# Patient Record
Sex: Female | Born: 1960
Health system: Southern US, Community
[De-identification: ages and names within clinical notes are randomized; demographics above are authoritative.]

## PROBLEM LIST (undated history)

## (undated) DIAGNOSIS — E039 Hypothyroidism, unspecified: Secondary | ICD-10-CM

## (undated) DIAGNOSIS — E079 Disorder of thyroid, unspecified: Secondary | ICD-10-CM

## (undated) DIAGNOSIS — M1990 Unspecified osteoarthritis, unspecified site: Secondary | ICD-10-CM

## (undated) DIAGNOSIS — Z85850 Personal history of malignant neoplasm of thyroid: Secondary | ICD-10-CM

## (undated) DIAGNOSIS — L408 Other psoriasis: Secondary | ICD-10-CM

## (undated) DIAGNOSIS — R7303 Prediabetes: Secondary | ICD-10-CM

## (undated) DIAGNOSIS — N83209 Unspecified ovarian cyst, unspecified side: Secondary | ICD-10-CM

## (undated) DIAGNOSIS — I639 Cerebral infarction, unspecified: Secondary | ICD-10-CM

## (undated) DIAGNOSIS — R011 Cardiac murmur, unspecified: Secondary | ICD-10-CM

## (undated) DIAGNOSIS — L732 Hidradenitis suppurativa: Secondary | ICD-10-CM

## (undated) DIAGNOSIS — N950 Postmenopausal bleeding: Secondary | ICD-10-CM

## (undated) DIAGNOSIS — Z7901 Long term (current) use of anticoagulants: Secondary | ICD-10-CM

## (undated) DIAGNOSIS — C801 Malignant (primary) neoplasm, unspecified: Secondary | ICD-10-CM

## (undated) DIAGNOSIS — I6523 Occlusion and stenosis of bilateral carotid arteries: Secondary | ICD-10-CM

## (undated) DIAGNOSIS — D6869 Other thrombophilia: Secondary | ICD-10-CM

## (undated) DIAGNOSIS — E782 Mixed hyperlipidemia: Secondary | ICD-10-CM

## (undated) DIAGNOSIS — D649 Anemia, unspecified: Secondary | ICD-10-CM

## (undated) DIAGNOSIS — M170 Bilateral primary osteoarthritis of knee: Secondary | ICD-10-CM

## (undated) HISTORY — PX: REPLACEMENT TOTAL KNEE: SUR1224

## (undated) HISTORY — PX: TUBAL LIGATION: SHX77

---

## 2006-12-03 ENCOUNTER — Ambulatory Visit: Payer: Self-pay | Admitting: Family Medicine

## 2011-09-22 HISTORY — PX: ENDOMETRIAL BIOPSY: SHX622

## 2012-05-24 ENCOUNTER — Ambulatory Visit: Payer: Self-pay | Admitting: Internal Medicine

## 2012-06-01 ENCOUNTER — Ambulatory Visit: Payer: Self-pay | Admitting: Internal Medicine

## 2012-06-01 LAB — HM MAMMOGRAPHY: HM MAMMO: NORMAL

## 2012-07-08 LAB — LIPID PANEL
Cholesterol: 179 mg/dL (ref 0–200)
HDL: 67 mg/dL (ref 35–70)
LDL Cholesterol: 100 mg/dL
TRIGLYCERIDES: 59 mg/dL (ref 40–160)

## 2012-07-08 LAB — TSH: TSH: 2.9 u[IU]/mL (ref 0.41–5.90)

## 2012-07-08 LAB — CBC AND DIFFERENTIAL: Hemoglobin: 12.7 g/dL (ref 12.0–16.0)

## 2012-07-08 LAB — HM PAP SMEAR: HM PAP: NEGATIVE

## 2012-07-08 LAB — BASIC METABOLIC PANEL
BUN: 17 mg/dL (ref 4–21)
CREATININE: 0.6 mg/dL (ref 0.5–1.1)

## 2012-07-11 ENCOUNTER — Ambulatory Visit: Payer: Self-pay | Admitting: Internal Medicine

## 2015-09-02 ENCOUNTER — Encounter: Payer: Self-pay | Admitting: Internal Medicine

## 2015-09-02 DIAGNOSIS — M1711 Unilateral primary osteoarthritis, right knee: Secondary | ICD-10-CM

## 2015-09-02 DIAGNOSIS — L732 Hidradenitis suppurativa: Secondary | ICD-10-CM | POA: Insufficient documentation

## 2015-09-02 DIAGNOSIS — M1712 Unilateral primary osteoarthritis, left knee: Secondary | ICD-10-CM | POA: Insufficient documentation

## 2015-09-03 ENCOUNTER — Encounter: Payer: Self-pay | Admitting: Internal Medicine

## 2015-09-03 ENCOUNTER — Ambulatory Visit: Payer: Self-pay | Admitting: Internal Medicine

## 2015-09-03 ENCOUNTER — Ambulatory Visit (INDEPENDENT_AMBULATORY_CARE_PROVIDER_SITE_OTHER): Payer: BLUE CROSS/BLUE SHIELD | Admitting: Internal Medicine

## 2015-09-03 VITALS — BP 140/82 | HR 64 | Ht 66.0 in | Wt 243.8 lb

## 2015-09-03 DIAGNOSIS — E041 Nontoxic single thyroid nodule: Secondary | ICD-10-CM | POA: Diagnosis not present

## 2015-09-03 DIAGNOSIS — L732 Hidradenitis suppurativa: Secondary | ICD-10-CM | POA: Diagnosis not present

## 2015-09-03 NOTE — Progress Notes (Signed)
Date:  09/03/2015   Name:  Leah Rogers   DOB:  1961/07/07   MRN:  [id number]   Patient returns to re-establish care.  Last seen in October 2013.  Chief Complaint: Neck Pain Neck Pain  This is a new problem. The current episode started more than 1 month ago. The problem occurs every several days. The problem has been waxing and waning. The pain is associated with nothing. The pain is present in the left side. Pertinent negatives include no chest pain, fever, headaches or trouble swallowing.  Rash This is a recurrent problem. The problem has been waxing and waning since onset. The rash is characterized by draining and swelling. She was exposed to nothing. Pertinent negatives include no diarrhea, fatigue, fever, shortness of breath or sore throat. Treatments tried: recurrent abscess in axilla.  She had a cold several months ago and noticed the neck swelling and tenderness then.  It is slowly getting smaller but still slightly tender. She denies any trouble swallowing. She has no history of thyroid problems but her mother did have thyroid surgery. She denies sweats, hair loss, diarrhea, palpitations or edema.   Review of Systems  Constitutional: Negative for fever, chills, fatigue and unexpected weight change.  HENT: Negative for sore throat and trouble swallowing.   Respiratory: Negative for chest tightness and shortness of breath.   Cardiovascular: Negative for chest pain, palpitations and leg swelling.  Gastrointestinal: Negative for abdominal pain and diarrhea.  Musculoskeletal: Positive for neck pain.  Skin: Negative for color change and rash.  Neurological: Negative for light-headedness and headaches.    Patient Active Problem List   Diagnosis Date Noted  . Osteoarthritis of knee 09/02/2015  . Hidradenitis axillaris 09/02/2015    Prior to Admission medications   Not on File    No Known Allergies  Past Surgical History  Procedure Laterality Date  . Endometrial biopsy  2013     benign- post menopausal bleeding    Social History  Substance Use Topics  . Smoking status: Never Smoker   . Smokeless tobacco: None  . Alcohol Use: No    Medication list has been reviewed and updated.   Physical Exam  Constitutional: She is oriented to person, place, and time. She appears well-developed and well-nourished.  Neck: Normal range of motion and full passive range of motion without pain. Neck supple. Carotid bruit is not present. Thyroid mass (enlarged left lobe with mild tenderness) present.  Cardiovascular: Normal rate, regular rhythm and normal heart sounds.   Pulmonary/Chest: Effort normal and breath sounds normal. No respiratory distress. She has no wheezes.  Abdominal: Soft. Bowel sounds are normal.  Musculoskeletal: She exhibits no edema or tenderness.  Neurological: She is alert and oriented to person, place, and time. She has normal reflexes.  Skin: Rash noted. Rash is nodular.  Open lesion in right axilla - 4x4 mm with mild surrounding erythema c/w abscess that has drained.  Nursing note and vitals reviewed.   BP 140/82 mmHg  Pulse 64  Ht 5\' 6"  (1.676 m)  Wt 243 lb 12.8 oz (110.587 kg)  BMI 39.37 kg/m2  Assessment and Plan: 1. Thyroid nodule Take Aleve 2 tablets twice a day for pain and inflammation  - Thyroid Panel With TSH - US Soft Tissue Head/Neck; Future  2. Hidradenitis axillaris Continue local care; avoid shaving and use antibacterial bath soap   Halina Maidens, MD [facility]  09/03/2015

## 2015-09-03 NOTE — Patient Instructions (Signed)
Take Aleve 2 tabs twice a day until results from labs and ultrasound return.

## 2015-09-04 LAB — THYROID PANEL WITH TSH
Free Thyroxine Index: 1.7 (ref 1.2–4.9)
T3 UPTAKE RATIO: 24 % (ref 24–39)
T4, Total: 6.9 ug/dL (ref 4.5–12.0)
TSH: 2.04 u[IU]/mL (ref 0.450–4.500)

## 2015-09-09 ENCOUNTER — Ambulatory Visit
Admission: RE | Admit: 2015-09-09 | Discharge: 2015-09-09 | Disposition: A | Discharge status: Home / Self Care | Payer: BLUE CROSS/BLUE SHIELD | Source: Ambulatory Visit | Attending: Internal Medicine | Admitting: Internal Medicine

## 2015-09-09 ENCOUNTER — Ambulatory Visit: Payer: Self-pay

## 2015-09-09 DIAGNOSIS — E042 Nontoxic multinodular goiter: Secondary | ICD-10-CM | POA: Insufficient documentation

## 2015-09-09 DIAGNOSIS — E041 Nontoxic single thyroid nodule: Secondary | ICD-10-CM

## 2015-09-10 ENCOUNTER — Other Ambulatory Visit: Payer: Self-pay | Admitting: Internal Medicine

## 2015-09-10 DIAGNOSIS — E042 Nontoxic multinodular goiter: Secondary | ICD-10-CM | POA: Insufficient documentation

## 2015-09-24 ENCOUNTER — Other Ambulatory Visit: Payer: Self-pay | Admitting: Otolaryngology

## 2015-09-24 DIAGNOSIS — E041 Nontoxic single thyroid nodule: Secondary | ICD-10-CM

## 2015-09-25 ENCOUNTER — Ambulatory Visit
Admission: RE | Admit: 2015-09-25 | Discharge: 2015-09-25 | Disposition: A | Discharge status: Home / Self Care | Payer: BLUE CROSS/BLUE SHIELD | Source: Ambulatory Visit | Attending: Otolaryngology | Admitting: Otolaryngology

## 2015-09-25 DIAGNOSIS — E041 Nontoxic single thyroid nodule: Secondary | ICD-10-CM

## 2015-09-25 NOTE — Procedures (Signed)
Interventional Radiology Procedure Note  Procedure:  Bilateral thyroid nodule biopsy  Complications:  None  Estimated Blood Loss:  < 10 mL  Bilateral dominant thyroid nodule biopsy performed.  25 G FNA x 5 for each nodule w/ samples sent for additional possible Afirma testing.  Venetia Night. Kathlene Cote, M.D Pager:  [phone]

## 2015-09-25 NOTE — Discharge Instructions (Signed)

## 2015-09-26 LAB — CYTOLOGY - NON PAP

## 2015-10-01 ENCOUNTER — Encounter: Payer: Self-pay | Admitting: Internal Medicine

## 2015-10-01 DIAGNOSIS — C73 Malignant neoplasm of thyroid gland: Secondary | ICD-10-CM | POA: Insufficient documentation

## 2015-10-07 ENCOUNTER — Encounter
Admission: RE | Admit: 2015-10-07 | Discharge: 2015-10-07 | Disposition: A | Discharge status: Home / Self Care | Payer: BLUE CROSS/BLUE SHIELD | Source: Ambulatory Visit | Attending: Otolaryngology | Admitting: Otolaryngology

## 2015-10-07 DIAGNOSIS — Z01812 Encounter for preprocedural laboratory examination: Secondary | ICD-10-CM | POA: Insufficient documentation

## 2015-10-07 HISTORY — DX: Unspecified osteoarthritis, unspecified site: M19.90

## 2015-10-07 HISTORY — DX: Cardiac murmur, unspecified: R01.1

## 2015-10-07 HISTORY — DX: Malignant (primary) neoplasm, unspecified: C80.1

## 2015-10-07 LAB — BASIC METABOLIC PANEL
Anion gap: 6 (ref 5–15)
BUN: 21 mg/dL — AB (ref 6–20)
CHLORIDE: 103 mmol/L (ref 101–111)
CO2: 29 mmol/L (ref 22–32)
Calcium: 8.8 mg/dL — ABNORMAL LOW (ref 8.9–10.3)
Creatinine, Ser: 0.69 mg/dL (ref 0.44–1.00)
GFR calc Af Amer: >60 mL/min (ref >60)
GLUCOSE: 101 mg/dL — AB (ref 65–99)
POTASSIUM: 3.6 mmol/L (ref 3.5–5.1)
Sodium: 138 mmol/L (ref 135–145)

## 2015-10-07 LAB — HEMOGLOBIN: Hemoglobin: 12.9 g/dL (ref 12.0–16.0)

## 2015-10-07 LAB — TSH: TSH: 2.67 u[IU]/mL (ref 0.350–4.500)

## 2015-10-07 LAB — T4, FREE: Free T4: 0.82 ng/dL (ref 0.61–1.12)

## 2015-10-07 NOTE — Patient Instructions (Signed)
  Your procedure is scheduled on: October 14, 2015 (Monday) Report to [facility]) Second Floor. To find out your arrival time please call [phone] between 1PM - 3PM on  October 11, 2015 (Friday)Remember: Instructions that are not followed completely may result in serious medical risk, up to and including death, or upon the discretion of your surgeon and anesthesiologist your surgery may need to be rescheduled.    _x___ 1. Do not eat food or drink liquids after midnight. No gum chewing or hard candies.     _x___ 2. No Alcohol for 24 hours before or after surgery.   ____ 3. Bring all medications with you on the day of surgery if instructed.    _x___ 4. Notify your doctor if there is any change in your medical condition     (cold, fever, infections).     Do not wear jewelry, make-up, hairpins, clips or nail polish.  Do not wear lotions, powders, or perfumes. You may wear deodorant.  Do not shave 48 hours prior to surgery. Men may shave face and neck.  Do not bring valuables to the hospital.    [facility] is not responsible for any belongings or valuables.               Contacts, dentures or bridgework may not be worn into surgery.  Leave your suitcase in the car. After surgery it may be brought to your room.  For patients admitted to the hospital, discharge time is determined by your                treatment team.   Patients discharged the day of surgery will not be allowed to drive home.   Please read over the following fact sheets that you were given:   Surgical Site Infection Prevention   ____ Take these medicines the morning of surgery with A SIP OF WATER:    1.   2.   3.   4.  5.  6.  ____ Fleet Enema (as directed)   _x___ Use CHG Soap as directed  ____ Use inhalers on the day of surgery  ____ Stop metformin 2 days prior to surgery    ____ Take 1/2 of usual insulin dose the night before surgery and none on the morning of surgery.   ____ Stop  Coumadin/Plavix/aspirin on   __x__ Stop Anti-inflammatories on (STOP ALEVE NOW ) TYLENOL OK TO TAKE FOR PAIN IF NEEDED)   ____ Stop supplements until after surgery.    ____ Bring C-Pap to the hospital.

## 2015-10-08 LAB — T3, FREE: T3, Free: 3.4 pg/mL (ref 2.0–4.4)

## 2015-10-14 ENCOUNTER — Ambulatory Visit: Payer: BLUE CROSS/BLUE SHIELD | Admitting: Certified Registered"

## 2015-10-14 ENCOUNTER — Encounter
Admission: RE | Disposition: A | Discharge status: Home / Self Care | Payer: Self-pay | Source: Ambulatory Visit | Attending: Otolaryngology

## 2015-10-14 ENCOUNTER — Encounter: Payer: Self-pay | Admitting: *Deleted

## 2015-10-14 ENCOUNTER — Observation Stay
Admission: RE | Admit: 2015-10-14 | Discharge: 2015-10-15 | Disposition: A | Discharge status: Home / Self Care | Payer: BLUE CROSS/BLUE SHIELD | Source: Ambulatory Visit | Attending: Otolaryngology | Admitting: Otolaryngology

## 2015-10-14 DIAGNOSIS — E890 Postprocedural hypothyroidism: Secondary | ICD-10-CM

## 2015-10-14 DIAGNOSIS — M542 Cervicalgia: Secondary | ICD-10-CM | POA: Insufficient documentation

## 2015-10-14 DIAGNOSIS — E042 Nontoxic multinodular goiter: Secondary | ICD-10-CM | POA: Diagnosis present

## 2015-10-14 DIAGNOSIS — E669 Obesity, unspecified: Secondary | ICD-10-CM | POA: Insufficient documentation

## 2015-10-14 DIAGNOSIS — Z6838 Body mass index (BMI) 38.0-38.9, adult: Secondary | ICD-10-CM | POA: Insufficient documentation

## 2015-10-14 DIAGNOSIS — Z87891 Personal history of nicotine dependence: Secondary | ICD-10-CM | POA: Insufficient documentation

## 2015-10-14 DIAGNOSIS — C73 Malignant neoplasm of thyroid gland: Principal | ICD-10-CM | POA: Insufficient documentation

## 2015-10-14 DIAGNOSIS — Z85850 Personal history of malignant neoplasm of thyroid: Secondary | ICD-10-CM

## 2015-10-14 HISTORY — PX: THYROIDECTOMY: SHX17

## 2015-10-14 SURGERY — THYROIDECTOMY
Anesthesia: General | Site: Neck | Wound class: Clean

## 2015-10-14 MED ORDER — LACTATED RINGERS IV SOLN
INTRAVENOUS | Status: DC
  Administered 2015-10-14: 07:00:00 via INTRAVENOUS

## 2015-10-14 MED ORDER — PROPOFOL 10 MG/ML IV BOLUS
INTRAVENOUS | Status: DC | PRN
  Administered 2015-10-14: 100 mg via INTRAVENOUS
  Administered 2015-10-14: 50 mg via INTRAVENOUS
  Administered 2015-10-14: 200 mg via INTRAVENOUS
  Administered 2015-10-14: 50 mg via INTRAVENOUS

## 2015-10-14 MED ORDER — DEXTROSE-NACL 5-0.45 % IV SOLN
INTRAVENOUS | Status: DC
  Administered 2015-10-14 – 2015-10-15 (×2): via INTRAVENOUS

## 2015-10-14 MED ORDER — ACETAMINOPHEN 650 MG RE SUPP: 650.0000 mg | Freq: Four times a day (QID) | RECTAL | Status: DC | PRN

## 2015-10-14 MED ORDER — EPHEDRINE SULFATE 50 MG/ML IJ SOLN
INTRAMUSCULAR | Status: DC | PRN
  Administered 2015-10-14 (×3): 10 mg via INTRAVENOUS
  Administered 2015-10-14: 5 mg via INTRAVENOUS

## 2015-10-14 MED ORDER — MORPHINE SULFATE (PF) 4 MG/ML IV SOLN: 3.0000 mg | INTRAVENOUS | Status: DC | PRN

## 2015-10-14 MED ORDER — LIDOCAINE HCL (CARDIAC) 20 MG/ML IV SOLN
INTRAVENOUS | Status: DC | PRN
  Administered 2015-10-14: 100 mg via INTRAVENOUS

## 2015-10-14 MED ORDER — FLEET ENEMA 7-19 GM/118ML RE ENEM: 1.0000 | ENEMA | Freq: Once | RECTAL | Status: DC | PRN

## 2015-10-14 MED ORDER — ONDANSETRON HCL 4 MG/2ML IJ SOLN
INTRAMUSCULAR | Status: DC | PRN
  Administered 2015-10-14: 4 mg via INTRAVENOUS

## 2015-10-14 MED ORDER — LIDOCAINE-EPINEPHRINE (PF) 1 %-1:200000 IJ SOLN
INTRAMUSCULAR | Status: AC
  Filled 2015-10-14: qty 30

## 2015-10-14 MED ORDER — FAMOTIDINE 20 MG PO TABS
20.0000 mg | ORAL_TABLET | Freq: Once | ORAL | Status: AC
  Administered 2015-10-14: 20 mg via ORAL

## 2015-10-14 MED ORDER — CALCIUM CARBONATE-VITAMIN D 500-200 MG-UNIT PO TABS
1.0000 | ORAL_TABLET | Freq: Three times a day (TID) | ORAL | Status: DC
  Administered 2015-10-14 – 2015-10-15 (×4): 1 via ORAL
  Filled 2015-10-14 (×4): qty 1

## 2015-10-14 MED ORDER — HYDROCODONE-ACETAMINOPHEN 5-325 MG PO TABS
1.0000 | ORAL_TABLET | ORAL | Status: DC | PRN
  Administered 2015-10-14 – 2015-10-15 (×3): 2 via ORAL
  Filled 2015-10-14 (×3): qty 2

## 2015-10-14 MED ORDER — BACITRACIN ZINC 500 UNIT/GM EX OINT
TOPICAL_OINTMENT | CUTANEOUS | Status: AC
  Filled 2015-10-14: qty 28.35

## 2015-10-14 MED ORDER — ROCURONIUM BROMIDE 100 MG/10ML IV SOLN
INTRAVENOUS | Status: DC | PRN
  Administered 2015-10-14: 5 mg via INTRAVENOUS

## 2015-10-14 MED ORDER — ONDANSETRON 4 MG PO TBDP: 4.0000 mg | ORAL_TABLET | Freq: Four times a day (QID) | ORAL | Status: DC | PRN

## 2015-10-14 MED ORDER — KETAMINE HCL 10 MG/ML IJ SOLN
INTRAMUSCULAR | Status: DC | PRN
  Administered 2015-10-14 (×2): 25 mg via INTRAVENOUS

## 2015-10-14 MED ORDER — MIDAZOLAM HCL 2 MG/2ML IJ SOLN
INTRAMUSCULAR | Status: DC | PRN
  Administered 2015-10-14: 2 mg via INTRAVENOUS

## 2015-10-14 MED ORDER — ONDANSETRON HCL 4 MG/2ML IJ SOLN
4.0000 mg | Freq: Four times a day (QID) | INTRAMUSCULAR | Status: DC | PRN
  Administered 2015-10-14: 4 mg via INTRAVENOUS
  Filled 2015-10-14: qty 2

## 2015-10-14 MED ORDER — LIDOCAINE-EPINEPHRINE (PF) 1 %-1:200000 IJ SOLN
INTRAMUSCULAR | Status: DC | PRN
  Administered 2015-10-14: 8 mL

## 2015-10-14 MED ORDER — SUCCINYLCHOLINE CHLORIDE 20 MG/ML IJ SOLN
INTRAMUSCULAR | Status: DC | PRN
  Administered 2015-10-14: 120 mg via INTRAVENOUS

## 2015-10-14 MED ORDER — ACETAMINOPHEN 325 MG PO TABS
650.0000 mg | ORAL_TABLET | Freq: Four times a day (QID) | ORAL | Status: DC | PRN
  Filled 2015-10-14: qty 1

## 2015-10-14 MED ORDER — BISACODYL 5 MG PO TBEC: 5.0000 mg | DELAYED_RELEASE_TABLET | Freq: Every day | ORAL | Status: DC | PRN

## 2015-10-14 MED ORDER — HYDROMORPHONE HCL 1 MG/ML IJ SOLN: 0.2500 mg | INTRAMUSCULAR | Status: DC | PRN

## 2015-10-14 MED ORDER — BACITRACIN 500 UNIT/GM EX OINT
TOPICAL_OINTMENT | CUTANEOUS | Status: DC | PRN
  Administered 2015-10-14: 1 via TOPICAL

## 2015-10-14 MED ORDER — MORPHINE SULFATE (PF) 4 MG/ML IV SOLN
3.0000 mg | INTRAVENOUS | Status: DC | PRN
  Administered 2015-10-14: 3 mg via INTRAVENOUS
  Filled 2015-10-14: qty 1

## 2015-10-14 MED ORDER — DEXAMETHASONE SODIUM PHOSPHATE 4 MG/ML IJ SOLN
8.0000 mg | Freq: Once | INTRAMUSCULAR | Status: DC | PRN
  Filled 2015-10-14: qty 2

## 2015-10-14 MED ORDER — FENTANYL CITRATE (PF) 100 MCG/2ML IJ SOLN
INTRAMUSCULAR | Status: DC | PRN
  Administered 2015-10-14 (×2): 50 ug via INTRAVENOUS
  Administered 2015-10-14: 100 ug via INTRAVENOUS
  Administered 2015-10-14: 50 ug via INTRAVENOUS

## 2015-10-14 MED ORDER — PHENYLEPHRINE HCL 10 MG/ML IJ SOLN
INTRAMUSCULAR | Status: DC | PRN
  Administered 2015-10-14 (×2): 100 ug via INTRAVENOUS
  Administered 2015-10-14: 200 ug via INTRAVENOUS
  Administered 2015-10-14 (×2): 100 ug via INTRAVENOUS
  Administered 2015-10-14: 200 ug via INTRAVENOUS

## 2015-10-14 MED ORDER — DEXAMETHASONE SODIUM PHOSPHATE 10 MG/ML IJ SOLN
INTRAMUSCULAR | Status: DC | PRN
  Administered 2015-10-14: 5 mg via INTRAVENOUS

## 2015-10-14 MED ORDER — MAGNESIUM HYDROXIDE 400 MG/5ML PO SUSP: 30.0000 mL | Freq: Every day | ORAL | Status: DC | PRN

## 2015-10-14 MED ORDER — FAMOTIDINE 20 MG PO TABS
ORAL_TABLET | ORAL | Status: AC
  Administered 2015-10-14: 20 mg via ORAL
  Filled 2015-10-14: qty 1

## 2015-10-14 SURGICAL SUPPLY — 37 items
BLADE SURG 15 STRL LF DISP TIS (BLADE) ×2 IMPLANT
BLADE SURG 15 STRL SS (BLADE) ×4
CANISTER SUCT 1200ML W/VALVE (MISCELLANEOUS) ×3 IMPLANT
CORD BIP STRL DISP 12FT (MISCELLANEOUS) ×3 IMPLANT
DRAIN TLS ROUND 10FR (DRAIN) ×6 IMPLANT
DRAPE MAG INST 16X20 L/F (DRAPES) ×3 IMPLANT
DRSG TEGADERM 2-3/8X2-3/4 SM (GAUZE/BANDAGES/DRESSINGS) ×3 IMPLANT
DRSG TEGADERM 4X4.75 (GAUZE/BANDAGES/DRESSINGS) ×3 IMPLANT
DRSG TELFA 3X8 NADH (GAUZE/BANDAGES/DRESSINGS) ×3 IMPLANT
ELECT LARYNGEAL 6/7 (MISCELLANEOUS)
ELECT LARYNGEAL 8/9 (MISCELLANEOUS) ×3
ELECT REM PT RETURN 9FT ADLT (ELECTROSURGICAL) ×3
ELECTRODE LARYNGEAL 6/7 (MISCELLANEOUS) IMPLANT
ELECTRODE LARYNGEAL 8/9 (MISCELLANEOUS) ×1 IMPLANT
ELECTRODE REM PT RTRN 9FT ADLT (ELECTROSURGICAL) ×1 IMPLANT
FORCEPS JEWEL BIP 4-3/4 STR (INSTRUMENTS) ×3 IMPLANT
GLOVE BIO SURGEON STRL SZ7.5 (GLOVE) ×3 IMPLANT
GLOVE EXAM LX STRL 7.5 (GLOVE) ×3 IMPLANT
GOWN STRL REUS W/ TWL LRG LVL3 (GOWN DISPOSABLE) ×3 IMPLANT
GOWN STRL REUS W/TWL LRG LVL3 (GOWN DISPOSABLE) ×6
HARMONIC SCALPEL FOCUS (MISCELLANEOUS) ×3 IMPLANT
HEMOSTAT SURGICEL 2X3 (HEMOSTASIS) ×3 IMPLANT
HOOK STAY BLUNT/RETRACTOR 5M (MISCELLANEOUS) ×3 IMPLANT
LABEL OR SOLS (LABEL) ×3 IMPLANT
NS IRRIG 500ML POUR BTL (IV SOLUTION) ×3 IMPLANT
PACK HEAD/NECK (MISCELLANEOUS) ×3 IMPLANT
PROBE NEUROSIGN BIPOL (MISCELLANEOUS) ×1 IMPLANT
PROBE NEUROSIGN BIPOLAR (MISCELLANEOUS) ×2
SPONGE KITTNER 5P (MISCELLANEOUS) ×3 IMPLANT
SPONGE XRAY 4X4 16PLY STRL (MISCELLANEOUS) ×3 IMPLANT
STRAP SAFETY BODY (MISCELLANEOUS) ×3 IMPLANT
SUT ETHILON 6 0 9-3 1X18 BLK (SUTURE) ×3 IMPLANT
SUT PROLENE 6 0 P 1 18 (SUTURE) ×3 IMPLANT
SUT SILK 2 0 (SUTURE) ×2
SUT SILK 2-0 18XBRD TIE 12 (SUTURE) ×1 IMPLANT
SUT VIC AB 4-0 RB1 18 (SUTURE) ×3 IMPLANT
SYSTEM CHEST DRAIN TLS 7FR (DRAIN) IMPLANT

## 2015-10-14 NOTE — H&P (Signed)
  H&P has been reviewed and no changes necessary. To be downloaded later. 

## 2015-10-14 NOTE — Anesthesia Preprocedure Evaluation (Signed)
Anesthesia Evaluation  Patient identified by MRN, date of birth, ID band Patient awake    Reviewed: Allergy & Precautions, NPO status , Patient's Chart, lab work & pertinent test results  Airway Mallampati: III  TM Distance: >3 FB Neck ROM: Full    Dental  (+) Teeth Intact   Pulmonary former smoker,    Pulmonary exam normal        Cardiovascular Exercise Tolerance: Good Normal cardiovascular exam     Neuro/Psych    GI/Hepatic negative GI ROS, Neg liver ROS,   Endo/Other    Renal/GU negative Renal ROS     Musculoskeletal   Abdominal (+) + obese,  Abdomen: soft.    Peds  Hematology   Anesthesia Other Findings   Reproductive/Obstetrics                             Anesthesia Physical Anesthesia Plan  ASA: II  Anesthesia Plan: General   Post-op Pain Management:    Induction: Intravenous  Airway Management Planned: Oral ETT and Video Laryngoscope Planned  Additional Equipment:   Intra-op Plan:   Post-operative Plan: Extubation in OR  Informed Consent: I have reviewed the patients History and Physical, chart, labs and discussed the procedure including the risks, benefits and alternatives for the proposed anesthesia with the patient or authorized representative who has indicated his/her understanding and acceptance.     Plan Discussed with:   Anesthesia Plan Comments:         Anesthesia Quick Evaluation

## 2015-10-14 NOTE — Anesthesia Procedure Notes (Signed)
Procedure Name: Intubation Date/Time: 10/14/2015 7:35 AM Performed by: Silvana Newness Pre-anesthesia Checklist: Patient identified, Emergency Drugs available, Suction available, Patient being monitored and Timeout performed Patient Re-evaluated:Patient Re-evaluated prior to inductionOxygen Delivery Method: Circle system utilized Preoxygenation: Pre-oxygenation with 100% oxygen Intubation Type: IV induction Ventilation: Mask ventilation without difficulty Laryngoscope Size: Glidescope and 3 Grade View: Grade I Tube type: Oral Tube size: 7.0 mm Number of attempts: 1 Airway Equipment and Method: Rigid stylet Placement Confirmation: ETT inserted through vocal cords under direct vision,  positive ETCO2 and breath sounds checked- equal and bilateral Secured at: 20 cm Tube secured with: Tape Dental Injury: Teeth and Oropharynx as per pre-operative assessment  Comments: ETT wrapped with nerve stimulating monitor, Glidescope used to verify placement of nerve monitor, Dr. Kathyrn Sheriff agreed with placement

## 2015-10-14 NOTE — Transfer of Care (Signed)
Immediate Anesthesia Transfer of Care Note  Patient: Leah Rogers  Procedure(s) Performed: Procedure(s): THYROIDECTOMY (N/A)  Patient Location: PACU  Anesthesia Type:General  Level of Consciousness: awake, alert , oriented and patient cooperative  Airway & Oxygen Therapy: Patient Spontanous Breathing and Patient connected to face mask oxygen  Post-op Assessment: Report given to RN, Post -op Vital signs reviewed and stable and Patient moving all extremities X 4  Post vital signs: Reviewed and stable  Last Vitals:  Filed Vitals:   10/14/15 0603 10/14/15 0940  BP: 143/94 135/88  Pulse: 71 103  Temp: 36.8 C 37.6 C  Resp: 14 13    Complications: No apparent anesthesia complications

## 2015-10-14 NOTE — Progress Notes (Signed)
Blood pressure 140/96  Dr rice in to see pt  Aware of blood pressure  No new orders

## 2015-10-14 NOTE — Op Note (Signed)
10/14/2015  9:26 AM    Leah Rogers  [id number]   Pre-Op Dx:  Left thyroid papillary carcinoma, multinodular goiter  Post-op Dx: Same  Proc: Total thyroidectomy with sparing of the recurrent laryngeal nerves   Surg:  Lillith Mcneff H  Anes:  GOT  EBL:  20 mL  Comp:  None  Findings:  Large thyroid nodule on the right side, smaller thyroid nodule on the left. Nerves intact and stimulated well at the end of the procedure. Inferior parathyroid glands noted on both sides but superior parathyroid glands were not visualized  Procedure: Patient was given general anesthesia by oral endotracheal intubation. A special endotracheal tube that was wound with electrodes for continuous monitoring the current laryngeal nerve was placed under direct visualization to make sure the electrodes were in contact with the cords. The patient was placed supine with a shoulder roll for extension of her neck. The neck was marked along a low anterior skin crease and then 8 mL 1% Xylocaine with epi 1:200,000 was used for infiltration of the low anterior neck. She was prepped and draped sterile fashion  The incision was made through the skin and subcutaneous down into the low anterior neck. Her anterior superficial jugular veins were crossclamped and tied off on both sides. The strap muscles were then divided in the midline to visualize the thyroid. The left side was addressed first because of the papillary cancer. The strap muscles were elevated off of the thyroid gland in the inferior thyroid vessels were cauterized with the Harmonic scalpel. The lateral thyroid veins were cut with Harmonic scalpel. Action was made around the inferior pole and the superior thyroid vessels were identified, separated, and cauterized with the Harmonic scalpel. The parathyroid gland was not found superiorly. With the superior inferior and lateral attachments all freed up the gland was rotated medially in the current laryngeal nerve was  found in the bed. It was tracked out to separated from the underlying tissues of the thyroid gland. It appeared the inferior parathyroid gland was separated and left in the bed. The attachments of the thyroid gland to the trachea were then freed up to allow the gland to move out on the left side. The nerve was restimulated and was intact and lying in the posterior bed.  The right side was then addressed and the strap muscles were elevated off of the thyroid gland. Lateral and inferior vessels were cauterized with the Harmonic scalpel to free up a very large nodule on the posterior thyroid gland. The superior thyroid pole was then dissected out and the superior vessels were then cut across with the Harmonic scalpel. Again the superior parathyroid gland was not directly visualized. The gland was then rotated medially and again the recurrent laryngeal nerve was found and tracked out spare this. The remaining attachments of the thyroid to the trachea were then freed up, care to not injure the recurrent nerve. Once the entire gland was removed a sure was placed at the right superior pole, to help assess orientation and pathology. Recurrent laryngeal nerve was stimulated and estimated well.  Both sides were irrigated and there was minimal bleeding noted. This was controlled with bipolar cautery. Surgicel was placed in the surgical bed on both sides and a 10 French TLS drain was placed in each bed, crisscrossing the midline and coming through separate stab incisions. The fascia of the strap muscles was tacked together in the midline with 2 4/0 Vicryls. The platysma layer was then closed with 4/0 Vicryls. The  dermis was then closed with 4/0 Vicryls. The skin edges were then held in apposition with a 6-0 nylon running locking suture. The drains were placed to low continuous Vacutainer suction. The wound was then covered with some bacitracin ointment followed by Telfa and a Tegaderm.  The patient was awakened and taken  to the recovery room in satisfactory condition. There were no operative complications.  Dispo:   To PACU to be sent to the floor for observation tonight  Plan:  Rest with her head elevated. Increase diet slowly. Remove drains and discharged home tomorrow morning  Betrice Wanat H  10/14/2015 9:26 AM

## 2015-10-14 NOTE — Progress Notes (Signed)
ENT Postop check  AVSS. Wound dressing clean and dry. No swelling. Voice clear.  Some nausea today, will take clear liquids tonight and then increase as tolerated. Given nausea meds.   Plan to remove drain in am and d/c home in am.   Margaretha Sheffield, MD 10/14/15 6:20pm

## 2015-10-14 NOTE — Anesthesia Postprocedure Evaluation (Signed)
Anesthesia Post Note  Patient: Leah Rogers  Procedure(s) Performed: Procedure(s) (LRB): THYROIDECTOMY (N/A)  Patient location during evaluation: PACU Anesthesia Type: General Level of consciousness: awake Pain management: pain level controlled Vital Signs Assessment: post-procedure vital signs reviewed and stable Respiratory status: spontaneous breathing Cardiovascular status: blood pressure returned to baseline Postop Assessment: no headache Anesthetic complications: no    Last Vitals:  Filed Vitals:   10/14/15 1040 10/14/15 1103  BP: 139/97 134/89  Pulse: 90 88  Temp:  36.3 C  Resp:  16    Last Pain: There were no vitals filed for this visit.               Jennfer Gassen M

## 2015-10-15 DIAGNOSIS — C73 Malignant neoplasm of thyroid gland: Secondary | ICD-10-CM | POA: Diagnosis not present

## 2015-10-15 LAB — SURGICAL PATHOLOGY

## 2015-10-15 LAB — CALCIUM: Calcium: 8.4 mg/dL — ABNORMAL LOW (ref 8.9–10.3)

## 2015-10-15 MED ORDER — HYDROCODONE-ACETAMINOPHEN 5-325 MG PO TABS: 1.0000 | ORAL_TABLET | ORAL | Status: DC | PRN

## 2015-10-15 NOTE — Progress Notes (Signed)
RN educated pt on usage of incentive spirometry, pt demonstrated teach back method and reached 700. Goal is 1000. Will continue to encourage usage.   Angus Seller

## 2015-10-15 NOTE — Progress Notes (Signed)
10/15/2015 10:13 AM  BP 121/74 mmHg  Pulse 70  Temp(Src) 97.8 F (36.6 C) (Oral)  Resp 18  Ht 5\' 6"  (1.676 m)  Wt 108.41 kg (239 lb)  BMI 38.59 kg/m2  SpO2 97%. Patient discharged per MD orders. TLS drains removed per MD. Dressing dry and intact. Discharge instructions reviewed with patient and patient verbalized understanding. IV removed per policy. Prescriptions discussed with patient, patient aware of prescription pickup at MD office. Bacitracin ointment received from PACU given to patient.  Discharged via wheelchair escorted by volunteer.   Almedia Balls, RN

## 2015-10-15 NOTE — Discharge Summary (Signed)
Discharge Summary 10/15/2015  S: Patient really well, no longer having nausea. Having small amount of neck pain O:AVSS, wound is clean and dry with no swelling. Dressing changed and drains removed. Voice is clear. Calcium level is 8.4. A: Healing well postop and ready for discharge P: D/C home this morning. Follow-up in 1 week for suture removal. Will take calcium supplements twice a day with meals. Will keep the bandage over the wound with Telfa. Will call the office if there are any concerns.  Margaretha Sheffield, MD 10/15/2015 8am

## 2015-10-22 ENCOUNTER — Other Ambulatory Visit: Payer: Self-pay | Admitting: *Deleted

## 2015-10-30 ENCOUNTER — Inpatient Hospital Stay: Payer: BLUE CROSS/BLUE SHIELD

## 2015-10-30 ENCOUNTER — Inpatient Hospital Stay: Payer: BLUE CROSS/BLUE SHIELD | Attending: Oncology | Admitting: Oncology

## 2015-10-30 VITALS — BP 124/85 | HR 62 | Temp 97.8°F | Ht 67.52 in | Wt 241.8 lb

## 2015-10-30 DIAGNOSIS — M1990 Unspecified osteoarthritis, unspecified site: Secondary | ICD-10-CM | POA: Diagnosis not present

## 2015-10-30 DIAGNOSIS — Z79899 Other long term (current) drug therapy: Secondary | ICD-10-CM | POA: Diagnosis not present

## 2015-10-30 DIAGNOSIS — C73 Malignant neoplasm of thyroid gland: Secondary | ICD-10-CM | POA: Diagnosis not present

## 2015-10-30 DIAGNOSIS — Z87891 Personal history of nicotine dependence: Secondary | ICD-10-CM

## 2015-10-31 LAB — THYROGLOBULIN ANTIBODY: Thyroglobulin Antibody: <1 IU/mL (ref 0.0–0.9)

## 2015-11-03 NOTE — Progress Notes (Signed)
[facility]  [phone] [phone]) [phone]  ID: Leah Rogers OB: 11-20-1960  MR#: [id number]  [id number]  Patient Care Team: Glean Hess, MD as PCP - General (Family Medicine)  CHIEF COMPLAINT:  Chief Complaint  Patient presents with  . New Evaluation    Thyroid cancer    INTERVAL HISTORY:  Patient is a 55 year old female who recently had symptoms of difficulty swallowing. This led to ultrasound of her thyroid as well as surgery revealing a stage II thyroid carcinoma.  She currently feels well and is fully recovered from her surgery. She has no neurologic complaints. She denies any recent fevers. She denies any pain. She has no chest pain or shortness of breath. She denies any nausea, vomiting, constipation, or diarrhea. She has no urinary complaints. Patient feels at her baseline and offers no specific complaints today.  REVIEW OF SYSTEMS:   Review of Systems  Constitutional: Negative.  Negative for fever, weight loss and malaise/fatigue.  HENT: Negative.   Respiratory: Negative.  Negative for shortness of breath.   Cardiovascular: Negative.  Negative for chest pain.  Gastrointestinal: Negative.   Musculoskeletal: Negative.   Neurological: Negative.  Negative for weakness.    As per HPI. Otherwise, a complete review of systems is negatve.  PAST MEDICAL HISTORY: Past Medical History  Diagnosis Date  . Heart murmur   . Arthritis   . Cancer [facility])     Thyroid    PAST SURGICAL HISTORY: Past Surgical History  Procedure Laterality Date  . Endometrial biopsy  2013    benign- post menopausal bleeding  . Tubal ligation    . Thyroidectomy N/A 10/14/2015    Procedure: THYROIDECTOMY;  Surgeon: Margaretha Sheffield, MD;  Location: [facility] ORS;  Service: ENT;  Laterality: N/A;    FAMILY HISTORY Family History  Problem Relation Age of Onset  . Hypertension Mother        ADVANCED DIRECTIVES:    HEALTH MAINTENANCE: Social History    Substance Use Topics  . Smoking status: Former Smoker -- 0.50 packs/day    Types: Cigarettes    Quit date: 03/21/2005  . Smokeless tobacco: Never Used  . Alcohol Use: No     Colonoscopy:  PAP:  Bone density:  Lipid panel:  No Known Allergies  Current Outpatient Prescriptions  Medication Sig Dispense Refill  . calcium gluconate 650 MG tablet Take 650 mg by mouth daily.    Marland Kitchen HYDROcodone-acetaminophen (NORCO/VICODIN) 5-325 MG tablet Take 1-2 tablets by mouth every 4 (four) hours as needed for moderate pain. 30 tablet 0  . liothyronine (CYTOMEL) 5 MCG tablet Take 5 mcg by mouth daily. Take 3 QD    . vitamin E 400 UNIT capsule Take 400 Units by mouth 2 (two) times daily.     No current facility-administered medications for this visit.    OBJECTIVE: Filed Vitals:   10/30/15 1008  BP: 124/85  Pulse: 62  Temp: 97.8 F (36.6 C)     Body mass index is 37.3 kg/(m^2).    ECOG FS:0 - Asymptomatic  General: Well-developed, well-nourished, no acute distress. Eyes: Pink conjunctiva, anicteric sclera. HEENT: Normocephalic, moist mucous membranes, clear oropharnyx.  No palpable lymphadenopathy, fully healed surgical scar. Lungs: Clear to auscultation bilaterally. Heart: Regular rate and rhythm. No rubs, murmurs, or gallops. Abdomen: Soft, nontender, nondistended. No organomegaly noted, normoactive bowel sounds. Musculoskeletal: No edema, cyanosis, or clubbing. Neuro: Alert, answering all questions appropriately. Cranial nerves grossly intact. Skin: No rashes or petechiae noted. Psych: Normal affect.  Lymphatics: No cervical, calvicular, axillary or inguinal LAD.   LAB RESULTS:  Lab Results  Component Value Date   NA 138 10/07/2015   K 3.6 10/07/2015   CL 103 10/07/2015   CO2 29 10/07/2015   GLUCOSE 101* 10/07/2015   BUN 21* 10/07/2015   CREATININE 0.69 10/07/2015   CALCIUM 8.4* 10/15/2015   GFRNONAA >60 10/07/2015   GFRAA >60 10/07/2015    Lab Results  Component Value  Date   HGB 12.9 10/07/2015     STUDIES: No results found.  ASSESSMENT:  Stage II thyroid carcinoma.  PLAN:     1. Thyroid carcinoma: given patient's stage of disease, she would definitely benefit from radioactive iodide ablation which should occur approximately 4-6 weeks after her surgery which was on October 14, 2015.  Thyroglobulin antibodies are less than 1.0.  She has been placed on T3 supplementation, which can be discontinued approximately one week prior to her ablation.  Patient should receive her test dose and then scanned at 4 and 24 hours followed by her ablation. Will get a PET scan in the next week to assess for any metastatic disease. Patient will return to clinic approximately 2 weeks after her ablation further evaluation and initiation of  thyroid supplementation.  Approximately 45 minutes was spent in discussion of which greater than 50% was consultation.  Patient expressed understanding and was in agreement with this plan. She also understands that She can call clinic at any time with any questions, concerns, or complaints.   Papillary thyroid carcinoma ([place])   Staging form: Thyroid - Papillary or Follicular (45 years and older), AJCC 7th Edition     Pathologic stage from 10/14/2015: Stage II (T2, N0, cM0) - Signed by Lloyd Huger, MD on 11/03/2015   Lloyd Huger, MD   11/03/2015 8:17 AM

## 2015-11-04 ENCOUNTER — Ambulatory Visit
Admission: RE | Admit: 2015-11-04 | Discharge: 2015-11-04 | Disposition: A | Discharge status: Home / Self Care | Payer: BLUE CROSS/BLUE SHIELD | Source: Ambulatory Visit | Attending: Oncology | Admitting: Oncology

## 2015-11-04 DIAGNOSIS — C73 Malignant neoplasm of thyroid gland: Secondary | ICD-10-CM

## 2015-11-07 ENCOUNTER — Other Ambulatory Visit: Payer: Self-pay | Admitting: Oncology

## 2015-11-07 ENCOUNTER — Ambulatory Visit
Admission: RE | Admit: 2015-11-07 | Discharge: 2015-11-07 | Disposition: A | Discharge status: Home / Self Care | Payer: BLUE CROSS/BLUE SHIELD | Source: Ambulatory Visit | Attending: Oncology | Admitting: Oncology

## 2015-11-07 DIAGNOSIS — Z9889 Other specified postprocedural states: Secondary | ICD-10-CM | POA: Diagnosis not present

## 2015-11-07 DIAGNOSIS — I700 Atherosclerosis of aorta: Secondary | ICD-10-CM | POA: Insufficient documentation

## 2015-11-07 DIAGNOSIS — C73 Malignant neoplasm of thyroid gland: Secondary | ICD-10-CM

## 2015-11-07 LAB — GLUCOSE, CAPILLARY: Glucose-Capillary: 86 mg/dL (ref 65–99)

## 2015-11-07 MED ORDER — FLUDEOXYGLUCOSE F - 18 (FDG) INJECTION
12.2500 | Freq: Once | INTRAVENOUS | Status: AC | PRN
  Administered 2015-11-07: 12.25 via INTRAVENOUS

## 2015-11-18 ENCOUNTER — Encounter
Admission: RE | Admit: 2015-11-18 | Discharge: 2015-11-18 | Disposition: A | Discharge status: Home / Self Care | Payer: BLUE CROSS/BLUE SHIELD | Source: Ambulatory Visit | Attending: Oncology | Admitting: Oncology

## 2015-11-18 ENCOUNTER — Telehealth: Payer: Self-pay | Admitting: *Deleted

## 2015-11-18 DIAGNOSIS — Z01818 Encounter for other preprocedural examination: Secondary | ICD-10-CM

## 2015-11-18 DIAGNOSIS — C73 Malignant neoplasm of thyroid gland: Secondary | ICD-10-CM | POA: Diagnosis present

## 2015-11-18 MED ORDER — THYROTROPIN ALFA 1.1 MG IM SOLR
0.9000 mg | INTRAMUSCULAR | Status: AC
  Administered 2015-11-18: 0.9 mg via INTRAMUSCULAR

## 2015-11-18 NOTE — Telephone Encounter (Signed)
order entered 

## 2015-11-18 NOTE — Telephone Encounter (Signed)
Ned serum pregnancy test done before thyroid testing can be done.Please enter order in computer and they will let pt know to have labs drawn

## 2015-11-19 ENCOUNTER — Other Ambulatory Visit
Admission: RE | Admit: 2015-11-19 | Discharge: 2015-11-19 | Disposition: A | Discharge status: Home / Self Care | Payer: BLUE CROSS/BLUE SHIELD | Source: Ambulatory Visit | Attending: Oncology | Admitting: Oncology

## 2015-11-19 ENCOUNTER — Encounter: Admission: RE | Admit: 2015-11-19 | Payer: BLUE CROSS/BLUE SHIELD | Source: Ambulatory Visit

## 2015-11-19 DIAGNOSIS — Z01818 Encounter for other preprocedural examination: Secondary | ICD-10-CM | POA: Diagnosis not present

## 2015-11-19 LAB — HCG, QUANTITATIVE, PREGNANCY: hCG, Beta Chain, Quant, S: 2 m[IU]/mL (ref <5)

## 2015-11-19 MED ORDER — THYROTROPIN ALFA 1.1 MG IM SOLR: 0.9000 mg | INTRAMUSCULAR | Status: AC

## 2015-11-20 ENCOUNTER — Encounter: Admission: RE | Admit: 2015-11-20 | Payer: BLUE CROSS/BLUE SHIELD | Source: Ambulatory Visit

## 2015-11-20 DIAGNOSIS — C73 Malignant neoplasm of thyroid gland: Secondary | ICD-10-CM | POA: Diagnosis not present

## 2015-11-20 MED ORDER — SODIUM IODIDE I 131 CAPSULE
71.3000 | Freq: Once | INTRAVENOUS | Status: AC | PRN
  Administered 2015-11-20: 70.2 via ORAL

## 2015-11-28 ENCOUNTER — Encounter
Admission: RE | Admit: 2015-11-28 | Discharge: 2015-11-28 | Disposition: A | Discharge status: Home / Self Care | Payer: BLUE CROSS/BLUE SHIELD | Source: Ambulatory Visit | Attending: Oncology | Admitting: Oncology

## 2015-11-28 DIAGNOSIS — C73 Malignant neoplasm of thyroid gland: Secondary | ICD-10-CM | POA: Diagnosis not present

## 2015-12-09 ENCOUNTER — Inpatient Hospital Stay: Payer: BLUE CROSS/BLUE SHIELD | Attending: Oncology | Admitting: Oncology

## 2015-12-09 ENCOUNTER — Inpatient Hospital Stay: Payer: BLUE CROSS/BLUE SHIELD

## 2015-12-09 VITALS — BP 145/82 | HR 88 | Temp 98.1°F | Resp 18 | Wt 248.2 lb

## 2015-12-09 DIAGNOSIS — M1990 Unspecified osteoarthritis, unspecified site: Secondary | ICD-10-CM | POA: Insufficient documentation

## 2015-12-09 DIAGNOSIS — R21 Rash and other nonspecific skin eruption: Secondary | ICD-10-CM | POA: Insufficient documentation

## 2015-12-09 DIAGNOSIS — Z87891 Personal history of nicotine dependence: Secondary | ICD-10-CM | POA: Diagnosis not present

## 2015-12-09 DIAGNOSIS — Z79899 Other long term (current) drug therapy: Secondary | ICD-10-CM | POA: Diagnosis not present

## 2015-12-09 DIAGNOSIS — C73 Malignant neoplasm of thyroid gland: Secondary | ICD-10-CM

## 2015-12-09 LAB — TSH: TSH: 18.709 u[IU]/mL — ABNORMAL HIGH (ref 0.350–4.500)

## 2015-12-09 LAB — T4, FREE: Free T4: 0.78 ng/dL (ref 0.61–1.12)

## 2015-12-09 NOTE — Progress Notes (Signed)
Patient states she has developed a rash and boils. Does not know if her medication is causing this.

## 2015-12-11 LAB — THYROGLOBULIN ANTIBODY: Thyroglobulin Antibody: <1 IU/mL (ref 0.0–0.9)

## 2015-12-11 LAB — T3, FREE: T3, Free: 3.8 pg/mL (ref 2.0–4.4)

## 2015-12-22 ENCOUNTER — Encounter: Payer: Self-pay | Admitting: Internal Medicine

## 2015-12-22 DIAGNOSIS — E079 Disorder of thyroid, unspecified: Secondary | ICD-10-CM | POA: Insufficient documentation

## 2015-12-22 NOTE — Progress Notes (Signed)
[place]  [phone] Fax:(336) [phone]  ID: Leah Rogers OB: 1960-09-26  MR#: [id number]  [id number]  Patient Care Team: Glean Hess, MD as PCP - General (Family Medicine)  CHIEF COMPLAINT:  Chief Complaint  Patient presents with  . Papillary thyroid cancer    INTERVAL HISTORY:  Patient returns to clinic today for repeat laboratory work and further evaluation. She recently has developed a rash, but otherwise feels well. She has no neurologic complaints. She denies any recent fevers. She denies any pain. She has no chest pain or shortness of breath. She denies any nausea, vomiting, constipation, or diarrhea. She has no urinary complaints. Patient feels at her baseline and offers no specific complaints today.  REVIEW OF SYSTEMS:   Review of Systems  Constitutional: Negative.  Negative for fever, weight loss and malaise/fatigue.  HENT: Negative.   Respiratory: Negative.  Negative for shortness of breath.   Cardiovascular: Negative.  Negative for chest pain.  Gastrointestinal: Negative.   Musculoskeletal: Negative.   Skin: Positive for rash.  Neurological: Negative.  Negative for weakness.    As per HPI. Otherwise, a complete review of systems is negatve.  PAST MEDICAL HISTORY: Past Medical History  Diagnosis Date  . Heart murmur   . Arthritis   . Cancer [facility])     Thyroid    PAST SURGICAL HISTORY: Past Surgical History  Procedure Laterality Date  . Endometrial biopsy  2013    benign- post menopausal bleeding  . Tubal ligation    . Thyroidectomy N/A 10/14/2015    Procedure: THYROIDECTOMY;  Surgeon: Margaretha Sheffield, MD;  Location: [facility] ORS;  Service: ENT;  Laterality: N/A;    FAMILY HISTORY Family History  Problem Relation Age of Onset  . Hypertension Mother        ADVANCED DIRECTIVES:    HEALTH MAINTENANCE: Social History  Substance Use Topics  . Smoking status: Former Smoker -- 0.50 packs/day    Types: Cigarettes     Quit date: 03/21/2005  . Smokeless tobacco: Never Used  . Alcohol Use: No     Colonoscopy:  PAP:  Bone density:  Lipid panel:  No Known Allergies  Current Outpatient Prescriptions  Medication Sig Dispense Refill  . calcium gluconate 650 MG tablet Take 650 mg by mouth daily.    . Cholecalciferol (VITAMIN D3) 2000 units CHEW Chew by mouth.    . levothyroxine (SYNTHROID, LEVOTHROID) 125 MCG tablet     . liothyronine (CYTOMEL) 5 MCG tablet Take 5 mcg by mouth daily. Take 3 QD     No current facility-administered medications for this visit.    OBJECTIVE: Filed Vitals:   12/09/15 1103  BP: 145/82  Pulse: 88  Temp: 98.1 F (36.7 C)  Resp: 18     Body mass index is 38.28 kg/(m^2).    ECOG FS:0 - Asymptomatic  General: Well-developed, well-nourished, no acute distress. Eyes: Pink conjunctiva, anicteric sclera. HEENT: Normocephalic, moist mucous membranes, clear oropharnyx.  No palpable lymphadenopathy, fully healed surgical scar. Lungs: Clear to auscultation bilaterally. Heart: Regular rate and rhythm. No rubs, murmurs, or gallops. Abdomen: Soft, nontender, nondistended. No organomegaly noted, normoactive bowel sounds. Musculoskeletal: No edema, cyanosis, or clubbing. Neuro: Alert, answering all questions appropriately. Cranial nerves grossly intact. Skin: Mild maculopapular rash noted, improving. Psych: Normal affect. Lymphatics: No cervical, calvicular, axillary or inguinal LAD.   LAB RESULTS:  Lab Results  Component Value Date   NA 138 10/07/2015   K 3.6 10/07/2015   CL 103 10/07/2015  CO2 29 10/07/2015   GLUCOSE 101* 10/07/2015   BUN 21* 10/07/2015   CREATININE 0.69 10/07/2015   CALCIUM 8.4* 10/15/2015   GFRNONAA >60 10/07/2015   GFRAA >60 10/07/2015    Lab Results  Component Value Date   HGB 12.9 10/07/2015     STUDIES: Nm Whole Body I131 Scan S/p Ca Rx  11/28/2015  CLINICAL DATA:  Papillary thyroid cancer post thyroidectomy and postoperative  radioactive iodine remnant ablation EXAM: NUCLEAR MEDICINE I-131 POST THERAPY WHOLE BODY SCAN TECHNIQUE: The patient received 70 point to mCi I-131 sodium iodide for the treatment of thyroid cancer within the past 10 days. The patient returns today, and whole body scanning was performed in the anterior and posterior projections. COMPARISON:  None FINDINGS: Residual tracer accumulation at the thyroid bed compatible with remnant. Physiologic distribution of I-131 is otherwise identified. No definite foci of abnormal I-131 accumulation seen to suggest iodine-avid metastatic disease. IMPRESSION: I-131 uptake with thyroid remnant. Otherwise negative whole-body I-131 scan without evidence of iodine-avid metastatic disease. Electronically Signed   By: Lavonia Dana M.D.   On: 11/28/2015 14:35    ASSESSMENT:  Stage II thyroid carcinoma.  PLAN:     1. Thyroid carcinoma: Whole-body iodide scan from November 28, 2015 without evidence of iodide avid metastatic disease. Patient's thyroglobulin antibodies continue to be less than 1.0. Patient's TSH is elevated, but her T4 and T3 are within normal limits. Continue thyroid supplementation as prescribed. Return to clinic in 3 months with repeat laboratory work and further evaluation. 2. Rash: Unclear etiology. Improving. Continue topical treatments as prescribed.   Approximately 30 minutes was spent in discussion of which greater than 50% was consultation.  Patient expressed understanding and was in agreement with this plan. She also understands that She can call clinic at any time with any questions, concerns, or complaints.   Papillary thyroid carcinoma ([place])   Staging form: Thyroid - Papillary or Follicular (45 years and older), AJCC 7th Edition     Pathologic stage from 10/14/2015: Stage II (T2, N0, cM0) - Signed by Lloyd Huger, MD on 11/03/2015   Lloyd Huger, MD   12/22/2015 9:25 AM

## 2016-01-02 ENCOUNTER — Encounter: Payer: Self-pay | Admitting: Internal Medicine

## 2016-01-02 ENCOUNTER — Ambulatory Visit (INDEPENDENT_AMBULATORY_CARE_PROVIDER_SITE_OTHER): Payer: BLUE CROSS/BLUE SHIELD | Admitting: Internal Medicine

## 2016-01-02 VITALS — BP 118/74 | HR 78 | Temp 97.7°F | Resp 14 | Ht 66.0 in | Wt 243.0 lb

## 2016-01-02 DIAGNOSIS — L732 Hidradenitis suppurativa: Secondary | ICD-10-CM | POA: Diagnosis not present

## 2016-01-02 DIAGNOSIS — Z1211 Encounter for screening for malignant neoplasm of colon: Secondary | ICD-10-CM

## 2016-01-02 DIAGNOSIS — Z124 Encounter for screening for malignant neoplasm of cervix: Secondary | ICD-10-CM | POA: Diagnosis not present

## 2016-01-02 DIAGNOSIS — C73 Malignant neoplasm of thyroid gland: Secondary | ICD-10-CM

## 2016-01-02 DIAGNOSIS — E559 Vitamin D deficiency, unspecified: Secondary | ICD-10-CM | POA: Diagnosis not present

## 2016-01-02 DIAGNOSIS — Z0000 Encounter for general adult medical examination without abnormal findings: Secondary | ICD-10-CM

## 2016-01-02 DIAGNOSIS — Z1239 Encounter for other screening for malignant neoplasm of breast: Secondary | ICD-10-CM | POA: Diagnosis not present

## 2016-01-02 LAB — POCT URINALYSIS DIPSTICK
BILIRUBIN UA: NEGATIVE
GLUCOSE UA: NEGATIVE
KETONES UA: NEGATIVE
Leukocytes, UA: NEGATIVE
Nitrite, UA: NEGATIVE
Protein, UA: NEGATIVE
SPEC GRAV UA: 1.015
UROBILINOGEN UA: 0.2
pH, UA: 6

## 2016-01-02 MED ORDER — MUPIROCIN 2 % EX OINT: 1.0000 "application " | TOPICAL_OINTMENT | Freq: Two times a day (BID) | CUTANEOUS | Status: DC

## 2016-01-02 NOTE — Progress Notes (Signed)
Date:  01/02/2016   Name:  Leah Rogers   DOB:  [birth year]   MRN:  [id number]   Chief Complaint: Annual Exam Leah Rogers is a 55 y.o. female who presents today for her Complete Annual Exam. She feels well. She reports exercising on treadmill. She reports she is sleeping well.  She denies any breast complaints.  She does her own exam.  Her last Mammogram was in 2013.  Thyroid Cancer - followed by Dr. Grayland Ormond and Dr. Kathyrn Sheriff. Recent thyroid tests were done.  Review of Systems  Constitutional: Negative for fever, chills and fatigue.  HENT: Negative for congestion, hearing loss, tinnitus, trouble swallowing and voice change.   Eyes: Negative for visual disturbance.  Respiratory: Negative for cough, chest tightness, shortness of breath and wheezing.   Cardiovascular: Negative for chest pain, palpitations and leg swelling.  Gastrointestinal: Negative for vomiting, abdominal pain, diarrhea and constipation.  Endocrine: Negative for polydipsia and polyuria.  Genitourinary: Negative for dysuria, frequency, vaginal bleeding, vaginal discharge and genital sores.  Musculoskeletal: Negative for joint swelling, arthralgias and gait problem.  Skin: Positive for wound (non healing cyst in right axilla). Negative for color change and rash.  Neurological: Negative for dizziness, tremors, light-headedness and headaches.  Hematological: Negative for adenopathy. Does not bruise/bleed easily.  Psychiatric/Behavioral: Negative for sleep disturbance and dysphoric mood. The patient is not nervous/anxious.     Patient Active Problem List   Diagnosis Date Noted  . S/P total thyroidectomy 10/14/2015  . Papillary thyroid carcinoma ([place]) 10/01/2015  . Osteoarthritis of knee 09/02/2015  . Hidradenitis axillaris 09/02/2015    Prior to Admission medications   Medication Sig Start Date End Date Taking? Authorizing Provider  calcium gluconate 650 MG tablet Take 650 mg by mouth daily.   Yes Historical  Provider, MD  Cholecalciferol (VITAMIN D3) 2000 units CHEW Chew by mouth.   Yes Historical Provider, MD  levothyroxine (SYNTHROID, LEVOTHROID) 125 MCG tablet Take 125 mcg by mouth daily before breakfast.  11/30/15  Yes Historical Provider, MD  liothyronine (CYTOMEL) 5 MCG tablet Take 5 mcg by mouth daily. Take 3 QD 10/21/15  Yes Historical Provider, MD    No Known Allergies  Past Surgical History  Procedure Laterality Date  . Endometrial biopsy  2013    benign- post menopausal bleeding  . Tubal ligation    . Thyroidectomy N/A 10/14/2015    Procedure: THYROIDECTOMY;  Surgeon: Margaretha Sheffield, MD;  Location: [facility] ORS;  Service: ENT;  Laterality: N/A;    Social History  Substance Use Topics  . Smoking status: Former Smoker -- 0.50 packs/day for 18 years    Types: Cigarettes    Quit date: 03/21/2005  . Smokeless tobacco: Never Used  . Alcohol Use: No     Medication list has been reviewed and updated.   Physical Exam  Constitutional: She is oriented to person, place, and time. She appears well-developed and well-nourished. No distress.  HENT:  Head: Normocephalic and atraumatic.  Right Ear: Tympanic membrane and ear canal normal.  Left Ear: Tympanic membrane and ear canal normal.  Nose: Right sinus exhibits no maxillary sinus tenderness. Left sinus exhibits no maxillary sinus tenderness.  Mouth/Throat: Uvula is midline and oropharynx is clear and moist.  Eyes: Conjunctivae and EOM are normal. Right eye exhibits no discharge. Left eye exhibits no discharge. No scleral icterus.  Neck: Normal range of motion. Carotid bruit is not present. No erythema present. No thyromegaly present.  Cardiovascular: Normal rate, regular rhythm, normal heart  sounds and normal pulses.   Pulmonary/Chest: Effort normal. No respiratory distress. She has no wheezes. Right breast exhibits no mass, no nipple discharge, no skin change and no tenderness. Left breast exhibits no mass, no nipple discharge, no skin  change and no tenderness.  Abdominal: Soft. Bowel sounds are normal. There is no hepatosplenomegaly. There is no tenderness. There is no CVA tenderness.  Genitourinary: Rectum normal, vagina normal and uterus normal. There is no rash, tenderness or lesion on the right labia. There is no rash, tenderness or lesion on the left labia. Cervix exhibits friability. Cervix exhibits no motion tenderness and no discharge. Right adnexum displays no mass, no tenderness and no fullness. Left adnexum displays no mass, no tenderness and no fullness.  Musculoskeletal: Normal range of motion. She exhibits edema. She exhibits no tenderness.  Lymphadenopathy:    She has no cervical adenopathy.    She has no axillary adenopathy.  Neurological: She is alert and oriented to person, place, and time. She has normal reflexes. No cranial nerve deficit or sensory deficit.  Skin: Skin is warm, dry and intact. No rash noted.  3 mm open cyst in right axilla - no drainage or odor  Psychiatric: She has a normal mood and affect. Her speech is normal and behavior is normal. Thought content normal.  Nursing note and vitals reviewed.   BP 118/74 mmHg  Pulse 78  Temp(Src) 97.7 F (36.5 C)  Resp 14  Ht 5\' 6"  (1.676 m)  Wt 243 lb (110.224 kg)  BMI 39.24 kg/m2  Assessment and Plan: 1. Annual physical exam Patient due for mammogram and colonoscopy - Lipid panel - POCT urinalysis dipstick  2. Papillary thyroid carcinoma ([place]) Followed by ENT and ONC - CBC with Differential/Platelet - Comprehensive metabolic panel  3. Breast cancer screening - MM DIGITAL SCREENING BILATERAL; Future  4. Colon cancer screening - Ambulatory referral to Gastroenterology  5. Cervical cancer screening - Pap IG and HPV (high risk) DNA detection  6. Vitamin D deficiency Continue supplement - VITAMIN D 25 Hydroxy (Vit-D Deficiency, Fractures)  7. Hidradenitis axillaris Will try Mupirocin topically   Halina Maidens, MD [facility]  01/02/2016

## 2016-01-02 NOTE — Patient Instructions (Signed)
Breast Self-Awareness Practicing breast self-awareness may pick up problems early, prevent significant medical complications, and possibly save your life. By practicing breast self-awareness, you can become familiar with how your breasts look and feel and if your breasts are changing. This allows you to notice changes early. It can also offer you some reassurance that your breast health is good. One way to learn what is normal for your breasts and whether your breasts are changing is to do a breast self-exam. If you find a lump or something that was not present in the past, it is best to contact your caregiver right away. Other findings that should be evaluated by your caregiver include nipple discharge, especially if it is bloody; skin changes or reddening; areas where the skin seems to be pulled in (retracted); or new lumps and bumps. Breast pain is seldom associated with cancer (malignancy), but should also be evaluated by a caregiver. HOW TO PERFORM A BREAST SELF-EXAM The best time to examine your breasts is 5-7 days after your menstrual period is over. During menstruation, the breasts are lumpier, and it may be more difficult to pick up changes. If you do not menstruate, have reached menopause, or had your uterus removed (hysterectomy), you should examine your breasts at regular intervals, such as monthly. If you are breastfeeding, examine your breasts after a feeding or after using a breast pump. Breast implants do not decrease the risk for lumps or tumors, so continue to perform breast self-exams as recommended. Talk to your caregiver about how to determine the difference between the implant and breast tissue. Also, talk about the amount of pressure you should use during the exam. Over time, you will become more familiar with the variations of your breasts and more comfortable with the exam. A breast self-exam requires you to remove all your clothes above the waist. 1. Look at your breasts and nipples.  Stand in front of a mirror in a room with good lighting. With your hands on your hips, push your hands firmly downward. Look for a difference in shape, contour, and size from one breast to the other (asymmetry). Asymmetry includes puckers, dips, or bumps. Also, look for skin changes, such as reddened or scaly areas on the breasts. Look for nipple changes, such as discharge, dimpling, repositioning, or redness. 2. Carefully feel your breasts. This is best done either in the shower or tub while using soapy water or when flat on your back. Place the arm (on the side of the breast you are examining) above your head. Use the pads (not the fingertips) of your three middle fingers on your opposite hand to feel your breasts. Start in the underarm area and use  inch (2 cm) overlapping circles to feel your breast. Use 3 different levels of pressure (light, medium, and firm pressure) at each circle before moving to the next circle. The light pressure is needed to feel the tissue closest to the skin. The medium pressure will help to feel breast tissue a little deeper, while the firm pressure is needed to feel the tissue close to the ribs. Continue the overlapping circles, moving downward over the breast until you feel your ribs below your breast. Then, move one finger-width towards the center of the body. Continue to use the  inch (2 cm) overlapping circles to feel your breast as you move slowly up toward the collar bone (clavicle) near the base of the neck. Continue the up and down exam using all 3 pressures until you reach the   middle of the chest. Do this with each breast, carefully feeling for lumps or changes. 3.  Keep a written record with breast changes or normal findings for each breast. By writing this information down, you do not need to depend only on memory for size, tenderness, or location. Write down where you are in your menstrual cycle, if you are still menstruating. Breast tissue can have some lumps or  thick tissue. However, see your caregiver if you find anything that concerns you.  SEEK MEDICAL CARE IF:  You see a change in shape, contour, or size of your breasts or nipples.   You see skin changes, such as reddened or scaly areas on the breasts or nipples.   You have an unusual discharge from your nipples.   You feel a new lump or unusually thick areas.    This information is not intended to replace advice given to you by your health care provider. Make sure you discuss any questions you have with your health care provider.   Document Released: 09/07/2005 Document Revised: 08/24/2012 Document Reviewed: 12/23/2011 Elsevier Interactive Patient Education 2016 Elsevier Inc.  

## 2016-01-03 LAB — COMPREHENSIVE METABOLIC PANEL
ALBUMIN: 4.1 g/dL (ref 3.5–5.5)
ALT: 14 IU/L (ref 0–32)
AST: 15 IU/L (ref 0–40)
Albumin/Globulin Ratio: 1.2 (ref 1.2–2.2)
Alkaline Phosphatase: 69 IU/L (ref 39–117)
BUN/Creatinine Ratio: 23 (ref 9–23)
BUN: 15 mg/dL (ref 6–24)
Bilirubin Total: 0.4 mg/dL (ref 0.0–1.2)
CALCIUM: 8.4 mg/dL — AB (ref 8.7–10.2)
CO2: 23 mmol/L (ref 18–29)
Chloride: 99 mmol/L (ref 96–106)
Creatinine, Ser: 0.65 mg/dL (ref 0.57–1.00)
GFR, EST AFRICAN AMERICAN: 115 mL/min/{1.73_m2} (ref >59)
GFR, EST NON AFRICAN AMERICAN: 100 mL/min/{1.73_m2} (ref >59)
GLUCOSE: 79 mg/dL (ref 65–99)
Globulin, Total: 3.5 g/dL (ref 1.5–4.5)
POTASSIUM: 4.2 mmol/L (ref 3.5–5.2)
Sodium: 140 mmol/L (ref 134–144)
Total Protein: 7.6 g/dL (ref 6.0–8.5)

## 2016-01-03 LAB — CBC WITH DIFFERENTIAL/PLATELET
BASOS ABS: 0 10*3/uL (ref 0.0–0.2)
Basos: 0 %
EOS (ABSOLUTE): 0.1 10*3/uL (ref 0.0–0.4)
EOS: 2 %
HEMATOCRIT: 38.4 % (ref 34.0–46.6)
HEMOGLOBIN: 12.7 g/dL (ref 11.1–15.9)
IMMATURE GRANULOCYTES: 0 %
Immature Grans (Abs): 0 10*3/uL (ref 0.0–0.1)
LYMPHS ABS: 1.7 10*3/uL (ref 0.7–3.1)
Lymphs: 37 %
MCH: 30.4 pg (ref 26.6–33.0)
MCHC: 33.1 g/dL (ref 31.5–35.7)
MCV: 92 fL (ref 79–97)
MONOCYTES: 15 %
Monocytes Absolute: 0.7 10*3/uL (ref 0.1–0.9)
NEUTROS PCT: 46 %
Neutrophils Absolute: 2 10*3/uL (ref 1.4–7.0)
Platelets: 198 10*3/uL (ref 150–379)
RBC: 4.18 x10E6/uL (ref 3.77–5.28)
RDW: 15.8 % — AB (ref 12.3–15.4)
WBC: 4.5 10*3/uL (ref 3.4–10.8)

## 2016-01-03 LAB — LIPID PANEL
CHOL/HDL RATIO: 3.3 ratio (ref 0.0–4.4)
Cholesterol, Total: 191 mg/dL (ref 100–199)
HDL: 58 mg/dL (ref >39)
LDL Calculated: 118 mg/dL — ABNORMAL HIGH (ref 0–99)
Triglycerides: 75 mg/dL (ref 0–149)
VLDL Cholesterol Cal: 15 mg/dL (ref 5–40)

## 2016-01-03 LAB — VITAMIN D 25 HYDROXY (VIT D DEFICIENCY, FRACTURES): VIT D 25 HYDROXY: 45.4 ng/mL (ref 30.0–100.0)

## 2016-01-07 ENCOUNTER — Telehealth: Payer: Self-pay

## 2016-01-07 LAB — PAP IG AND HPV HIGH-RISK
HPV, HIGH-RISK: NEGATIVE
PAP SMEAR COMMENT: 0

## 2016-01-07 NOTE — Telephone Encounter (Signed)
-----   Message from Glean Hess, MD sent at 01/03/2016  5:41 PM EDT ----- Labs are normal. Cholesterol is good.  Vitamin D is in the low normal range . Calcium is slightly low. Continue to take calcium and Vitamin D supplements.

## 2016-01-07 NOTE — Telephone Encounter (Signed)
-----   Message from Glean Hess, MD sent at 01/07/2016 11:51 AM EDT ----- Pap smear is normal.  HPV is negative.

## 2016-01-07 NOTE — Telephone Encounter (Signed)
Left message for patient to call back  

## 2016-01-15 ENCOUNTER — Telehealth: Payer: Self-pay

## 2016-01-15 ENCOUNTER — Other Ambulatory Visit: Payer: Self-pay

## 2016-01-15 NOTE — Telephone Encounter (Signed)
Gastroenterology Pre-Procedure Review  Request Date: 03/13/16 Requesting Physician: Dr. Army Melia  PATIENT REVIEW QUESTIONS: The patient responded to the following health history questions as indicated:    1. Are you having any GI issues? no 2. Do you have a personal history of Polyps? no 3. Do you have a family history of Colon Cancer or Polyps? no 4. Diabetes Mellitus? no 5. Joint replacements in the past 12 months?no 6. Major health problems in the past 3 months?no 7. Any artificial heart valves, MVP, or defibrillator?no    MEDICATIONS & ALLERGIES:    Patient reports the following regarding taking any anticoagulation/antiplatelet therapy:   Plavix, Coumadin, Eliquis, Xarelto, Lovenox, Pradaxa, Brilinta, or Effient? no Aspirin? no  Patient confirms/reports the following medications:  Current Outpatient Prescriptions  Medication Sig Dispense Refill  . calcium gluconate 650 MG tablet Take 650 mg by mouth daily.    . Cholecalciferol (VITAMIN D3) 2000 units CHEW Chew by mouth.    . levothyroxine (SYNTHROID, LEVOTHROID) 125 MCG tablet Take 125 mcg by mouth daily before breakfast.     . liothyronine (CYTOMEL) 5 MCG tablet Take 5 mcg by mouth daily. Take 3 QD    . mupirocin ointment (BACTROBAN) 2 % Place 1 application into the nose 2 (two) times daily. 22 g 0   No current facility-administered medications for this visit.    Patient confirms/reports the following allergies:  No Known Allergies  No orders of the defined types were placed in this encounter.    AUTHORIZATION INFORMATION Primary Insurance: 1D#: Group #:  Secondary Insurance: 1D#: Group #:  SCHEDULE INFORMATION: Date: 03/13/16 Time: Location: [place]

## 2016-01-16 NOTE — Telephone Encounter (Signed)
Spoke with patient. Patient advised of all results and verbalized understanding. Will call back with any future questions or concerns. MAH  

## 2016-01-20 DIAGNOSIS — E890 Postprocedural hypothyroidism: Secondary | ICD-10-CM | POA: Diagnosis not present

## 2016-01-21 ENCOUNTER — Telehealth: Payer: Self-pay | Admitting: Gastroenterology

## 2016-01-21 NOTE — Telephone Encounter (Signed)
Patient called and would like to change the date for her colonoscopy. Please call

## 2016-01-22 ENCOUNTER — Ambulatory Visit
Admission: RE | Admit: 2016-01-22 | Discharge: 2016-01-22 | Disposition: A | Discharge status: Home / Self Care | Payer: BLUE CROSS/BLUE SHIELD | Source: Ambulatory Visit | Attending: Internal Medicine | Admitting: Internal Medicine

## 2016-01-22 DIAGNOSIS — Z1231 Encounter for screening mammogram for malignant neoplasm of breast: Secondary | ICD-10-CM | POA: Diagnosis not present

## 2016-01-22 DIAGNOSIS — Z1239 Encounter for other screening for malignant neoplasm of breast: Secondary | ICD-10-CM

## 2016-01-22 NOTE — Telephone Encounter (Signed)
Pt has requested to move her appt to June 30th. Contacted [place] and left message to move appt date.

## 2016-03-13 ENCOUNTER — Other Ambulatory Visit: Payer: BLUE CROSS/BLUE SHIELD

## 2016-03-13 ENCOUNTER — Ambulatory Visit: Payer: BLUE CROSS/BLUE SHIELD | Admitting: Oncology

## 2016-03-18 NOTE — Discharge Instructions (Signed)

## 2016-03-20 ENCOUNTER — Ambulatory Visit: Payer: BLUE CROSS/BLUE SHIELD | Admitting: Anesthesiology

## 2016-03-20 ENCOUNTER — Ambulatory Visit
Admission: RE | Admit: 2016-03-20 | Discharge: 2016-03-20 | Disposition: A | Discharge status: Home / Self Care | Payer: BLUE CROSS/BLUE SHIELD | Source: Ambulatory Visit | Attending: Gastroenterology | Admitting: Gastroenterology

## 2016-03-20 ENCOUNTER — Encounter
Admission: RE | Disposition: A | Discharge status: Home / Self Care | Payer: Self-pay | Source: Ambulatory Visit | Attending: Gastroenterology

## 2016-03-20 ENCOUNTER — Encounter: Payer: Self-pay | Admitting: Gastroenterology

## 2016-03-20 DIAGNOSIS — K641 Second degree hemorrhoids: Secondary | ICD-10-CM | POA: Diagnosis not present

## 2016-03-20 DIAGNOSIS — Z85850 Personal history of malignant neoplasm of thyroid: Secondary | ICD-10-CM | POA: Diagnosis not present

## 2016-03-20 DIAGNOSIS — E890 Postprocedural hypothyroidism: Secondary | ICD-10-CM | POA: Insufficient documentation

## 2016-03-20 DIAGNOSIS — M1990 Unspecified osteoarthritis, unspecified site: Secondary | ICD-10-CM | POA: Insufficient documentation

## 2016-03-20 DIAGNOSIS — Z801 Family history of malignant neoplasm of trachea, bronchus and lung: Secondary | ICD-10-CM | POA: Insufficient documentation

## 2016-03-20 DIAGNOSIS — Z87891 Personal history of nicotine dependence: Secondary | ICD-10-CM | POA: Insufficient documentation

## 2016-03-20 DIAGNOSIS — Z8249 Family history of ischemic heart disease and other diseases of the circulatory system: Secondary | ICD-10-CM | POA: Diagnosis not present

## 2016-03-20 DIAGNOSIS — Z79899 Other long term (current) drug therapy: Secondary | ICD-10-CM | POA: Diagnosis not present

## 2016-03-20 DIAGNOSIS — Z1211 Encounter for screening for malignant neoplasm of colon: Secondary | ICD-10-CM | POA: Insufficient documentation

## 2016-03-20 DIAGNOSIS — R011 Cardiac murmur, unspecified: Secondary | ICD-10-CM | POA: Insufficient documentation

## 2016-03-20 HISTORY — PX: COLONOSCOPY WITH PROPOFOL: SHX5780

## 2016-03-20 SURGERY — COLONOSCOPY WITH PROPOFOL
Anesthesia: Monitor Anesthesia Care | Wound class: Contaminated

## 2016-03-20 MED ORDER — ACETAMINOPHEN 160 MG/5ML PO SOLN: 325.0000 mg | ORAL | Status: DC | PRN

## 2016-03-20 MED ORDER — ACETAMINOPHEN 325 MG PO TABS: 325.0000 mg | ORAL_TABLET | ORAL | Status: DC | PRN

## 2016-03-20 MED ORDER — LIDOCAINE HCL (CARDIAC) 20 MG/ML IV SOLN
INTRAVENOUS | Status: DC | PRN
  Administered 2016-03-20: 40 mg via INTRAVENOUS

## 2016-03-20 MED ORDER — STERILE WATER FOR IRRIGATION IR SOLN
Status: DC | PRN
  Administered 2016-03-20: 08:00:00

## 2016-03-20 MED ORDER — LACTATED RINGERS IV SOLN
INTRAVENOUS | Status: DC
  Administered 2016-03-20: 07:00:00 via INTRAVENOUS

## 2016-03-20 MED ORDER — PROPOFOL 10 MG/ML IV BOLUS
INTRAVENOUS | Status: DC | PRN
Start: 2016-03-20 — End: 2016-03-20
  Administered 2016-03-20: 50 mg via INTRAVENOUS
  Administered 2016-03-20: 20 mg via INTRAVENOUS
  Administered 2016-03-20: 50 mg via INTRAVENOUS
  Administered 2016-03-20: 20 mg via INTRAVENOUS
  Administered 2016-03-20: 50 mg via INTRAVENOUS
  Administered 2016-03-20: 20 mg via INTRAVENOUS
  Administered 2016-03-20: 50 mg via INTRAVENOUS
  Administered 2016-03-20: 20 mg via INTRAVENOUS

## 2016-03-20 SURGICAL SUPPLY — 23 items
CANISTER SUCT 1200ML W/VALVE (MISCELLANEOUS) ×3 IMPLANT
CLIP HMST 235XBRD CATH ROT (MISCELLANEOUS) IMPLANT
CLIP RESOLUTION 360 11X235 (MISCELLANEOUS)
FCP ESCP3.2XJMB 240X2.8X (MISCELLANEOUS)
FORCEPS BIOP RAD 4 LRG CAP 4 (CUTTING FORCEPS) IMPLANT
FORCEPS BIOP RJ4 240 W/NDL (MISCELLANEOUS)
FORCEPS ESCP3.2XJMB 240X2.8X (MISCELLANEOUS) IMPLANT
GOWN CVR UNV OPN BCK APRN NK (MISCELLANEOUS) ×2 IMPLANT
GOWN ISOL THUMB LOOP REG UNIV (MISCELLANEOUS) ×4
INJECTOR VARIJECT VIN23 (MISCELLANEOUS) IMPLANT
KIT DEFENDO VALVE AND CONN (KITS) IMPLANT
KIT ENDO PROCEDURE OLY (KITS) ×3 IMPLANT
MARKER SPOT ENDO TATTOO 5ML (MISCELLANEOUS) IMPLANT
PAD GROUND ADULT SPLIT (MISCELLANEOUS) IMPLANT
PROBE APC STR FIRE (PROBE) IMPLANT
RETRIEVER NET ROTH 2.5X230 LF (MISCELLANEOUS) ×3 IMPLANT
SNARE SHORT THROW 13M SML OVAL (MISCELLANEOUS) IMPLANT
SNARE SHORT THROW 30M LRG OVAL (MISCELLANEOUS) IMPLANT
SNARE SNG USE RND 15MM (INSTRUMENTS) IMPLANT
SPOT EX ENDOSCOPIC TATTOO (MISCELLANEOUS)
TRAP ETRAP POLY (MISCELLANEOUS) IMPLANT
VARIJECT INJECTOR VIN23 (MISCELLANEOUS)
WATER STERILE IRR 250ML POUR (IV SOLUTION) ×3 IMPLANT

## 2016-03-20 NOTE — Transfer of Care (Signed)
Immediate Anesthesia Transfer of Care Note  Patient: Leah Rogers  Procedure(s) Performed: Procedure(s): COLONOSCOPY WITH PROPOFOL (N/A)  Patient Location: PACU  Anesthesia Type: MAC  Level of Consciousness: awake, alert  and patient cooperative  Airway and Oxygen Therapy: Patient Spontanous Breathing and Patient connected to supplemental oxygen  Post-op Assessment: Post-op Vital signs reviewed, Patient's Cardiovascular Status Stable, Respiratory Function Stable, Patent Airway and No signs of Nausea or vomiting  Post-op Vital Signs: Reviewed and stable  Complications: No apparent anesthesia complications

## 2016-03-20 NOTE — Anesthesia Postprocedure Evaluation (Signed)
Anesthesia Post Note  Patient: Leah Rogers  Procedure(s) Performed: Procedure(s) (LRB): COLONOSCOPY WITH PROPOFOL (N/A)  Patient location during evaluation: PACU Anesthesia Type: MAC Level of consciousness: awake and alert and oriented Pain management: satisfactory to patient Vital Signs Assessment: post-procedure vital signs reviewed and stable Respiratory status: spontaneous breathing, nonlabored ventilation and respiratory function stable Cardiovascular status: blood pressure returned to baseline and stable Postop Assessment: Adequate PO intake and No signs of nausea or vomiting Anesthetic complications: no    Raliegh Ip

## 2016-03-20 NOTE — Op Note (Signed)
[facility] Gastroenterology Patient Name: Leah Rogers Procedure Date: 03/20/2016 7:55 AM MRN: [id number] Account #: [id number] Date of Birth: 12-03-1960 Admit Type: Outpatient Age: 55 Room: [facility] OR ROOM 01 Gender: Female Note Status: Finalized Procedure:            Colonoscopy Indications:          Screening for colorectal malignant neoplasm Providers:            Lucilla Lame, MD Referring MD:         Halina Maidens, MD (Referring MD) Medicines:            Propofol per Anesthesia Complications:        No immediate complications. Procedure:            Pre-Anesthesia Assessment:                       - Prior to the procedure, a History and Physical was                        performed, and patient medications and allergies were                        reviewed. The patient's tolerance of previous                        anesthesia was also reviewed. The risks and benefits of                        the procedure and the sedation options and risks were                        discussed with the patient. All questions were                        answered, and informed consent was obtained. Prior                        Anticoagulants: The patient has taken no previous                        anticoagulant or antiplatelet agents. ASA Grade                        Assessment: II - A patient with mild systemic disease.                        After reviewing the risks and benefits, the patient was                        deemed in satisfactory condition to undergo the                        procedure.                       After obtaining informed consent, the colonoscope was                        passed under direct vision. Throughout the procedure,  the patient's blood pressure, pulse, and oxygen                        saturations were monitored continuously. The Olympus CF                        H180AL colonoscope (S#: [id number]) was introduced through                         the anus and advanced to the the cecum, identified by                        appendiceal orifice and ileocecal valve. The                        colonoscopy was performed without difficulty. The                        patient tolerated the procedure well. The quality of                        the bowel preparation was good. Findings:      The perianal and digital rectal examinations were normal.      Non-bleeding internal hemorrhoids were found during retroflexion. The       hemorrhoids were Grade II (internal hemorrhoids that prolapse but reduce       spontaneously). Impression:           - Non-bleeding internal hemorrhoids.                       - No specimens collected. Recommendation:       - Repeat colonoscopy in 10 years for screening unless                        any change in family history or lower GI problems. Procedure Code(s):    --- Professional ---                       [phone], Colonoscopy, flexible; diagnostic, including                        collection of specimen(s) by brushing or washing, when                        performed (separate procedure) Diagnosis Code(s):    --- Professional ---                       Z12.11, Encounter for screening for malignant neoplasm                        of colon CPT copyright 2016 American Medical Association. All rights reserved. The codes documented in this report are preliminary and upon coder review may  be revised to meet current compliance requirements. Lucilla Lame, MD 03/20/2016 8:20:54 AM This report has been signed electronically. Number of Addenda: 0 Note Initiated On: 03/20/2016 7:55 AM Scope Withdrawal Time: 0 hours 7 minutes 53 seconds  Total Procedure Duration: 0 hours 14 minutes 34 seconds       [facility]

## 2016-03-20 NOTE — H&P (Signed)
  Leah Lame, MD [facility] [address], [place], [place] [address] Phone: [phone] Fax : [phone]  Primary Care Physician:  Halina Maidens, MD Primary Gastroenterologist:  Dr. Allen Norris  Pre-Procedure History & Physical: HPI:  Leah Rogers is a 55 y.o. female is here for a screening colonoscopy.   Past Medical History  Diagnosis Date  . Heart murmur   . Arthritis   . Cancer [facility])     Thyroid    Past Surgical History  Procedure Laterality Date  . Endometrial biopsy  2013    benign- post menopausal bleeding  . Tubal ligation    . Thyroidectomy N/A 10/14/2015    Procedure: THYROIDECTOMY;  Surgeon: Margaretha Sheffield, MD;  Location: [facility] ORS;  Service: ENT;  Laterality: N/A;    Prior to Admission medications   Medication Sig Start Date End Date Taking? Authorizing Provider  calcium gluconate 650 MG tablet Take 650 mg by mouth daily.   Yes Historical Provider, MD  Cholecalciferol (VITAMIN D3) 2000 units CHEW Chew by mouth.   Yes Historical Provider, MD  cyanocobalamin 500 MCG tablet Take 500 mcg by mouth daily.   Yes Historical Provider, MD  levothyroxine (SYNTHROID, LEVOTHROID) 125 MCG tablet Take 125 mcg by mouth daily before breakfast.  11/30/15  Yes Historical Provider, MD  liothyronine (CYTOMEL) 5 MCG tablet Take 5 mcg by mouth daily. Reported on 03/16/2016 10/21/15   Historical Provider, MD  mupirocin ointment (BACTROBAN) 2 % Place 1 application into the nose 2 (two) times daily. Patient not taking: Reported on 03/16/2016 01/02/16   Glean Hess, MD    Allergies as of 01/15/2016  . (No Known Allergies)    Family History  Problem Relation Age of Onset  . Hypertension Mother   . Lung cancer Mother   . Heart attack Father   . Heart attack Brother     Social History   Social History  . Marital Status: Married    Spouse Name: N/A  . Number of Children: N/A  . Years of Education: N/A   Occupational History  . Not on file.   Social History Main Topics  .  Smoking status: Former Smoker -- 0.50 packs/day for 18 years    Types: Cigarettes    Quit date: 03/21/2005  . Smokeless tobacco: Never Used  . Alcohol Use: No  . Drug Use: No  . Sexual Activity: Yes    Birth Control/ Protection: Other-see comments     Comment: tubal ligation   Other Topics Concern  . Not on file   Social History Narrative    Review of Systems: See HPI, otherwise negative ROS  Physical Exam: Ht 5\' 6"  (1.676 m)  Wt 235 lb (106.595 kg)  BMI 37.95 kg/m2 General:   Alert,  pleasant and cooperative in NAD Head:  Normocephalic and atraumatic. Neck:  Supple; no masses or thyromegaly. Lungs:  Clear throughout to auscultation.    Heart:  Regular rate and rhythm. Abdomen:  Soft, nontender and nondistended. Normal bowel sounds, without guarding, and without rebound.   Neurologic:  Alert and  oriented x4;  grossly normal neurologically.  Impression/Plan: [name] is now here to undergo a screening colonoscopy.  Risks, benefits, and alternatives regarding colonoscopy have been reviewed with the patient.  Questions have been answered.  All parties agreeable.

## 2016-03-20 NOTE — Anesthesia Preprocedure Evaluation (Signed)
Anesthesia Evaluation  Patient identified by MRN, date of birth, ID band  Reviewed: Allergy & Precautions, H&P , NPO status , Patient's Chart, lab work & pertinent test results  Airway Mallampati: III  TM Distance: >3 FB Neck ROM: full    Dental no notable dental hx.    Pulmonary former smoker,    Pulmonary exam normal        Cardiovascular  Rhythm:regular Rate:Normal     Neuro/Psych    GI/Hepatic   Endo/Other  Hypothyroidism   Renal/GU      Musculoskeletal   Abdominal   Peds  Hematology   Anesthesia Other Findings   Reproductive/Obstetrics                             Anesthesia Physical Anesthesia Plan  ASA: II  Anesthesia Plan: MAC   Post-op Pain Management:    Induction:   Airway Management Planned:   Additional Equipment:   Intra-op Plan:   Post-operative Plan:   Informed Consent: I have reviewed the patients History and Physical, chart, labs and discussed the procedure including the risks, benefits and alternatives for the proposed anesthesia with the patient or authorized representative who has indicated his/her understanding and acceptance.     Plan Discussed with: CRNA  Anesthesia Plan Comments:         Anesthesia Quick Evaluation

## 2016-03-27 ENCOUNTER — Inpatient Hospital Stay (HOSPITAL_BASED_OUTPATIENT_CLINIC_OR_DEPARTMENT_OTHER): Payer: BLUE CROSS/BLUE SHIELD | Admitting: Oncology

## 2016-03-27 ENCOUNTER — Other Ambulatory Visit: Payer: Self-pay | Admitting: *Deleted

## 2016-03-27 ENCOUNTER — Inpatient Hospital Stay: Payer: BLUE CROSS/BLUE SHIELD | Attending: Oncology

## 2016-03-27 VITALS — BP 130/90 | HR 79 | Temp 98.3°F | Resp 20 | Wt 240.1 lb

## 2016-03-27 DIAGNOSIS — M129 Arthropathy, unspecified: Secondary | ICD-10-CM

## 2016-03-27 DIAGNOSIS — Z85850 Personal history of malignant neoplasm of thyroid: Secondary | ICD-10-CM | POA: Insufficient documentation

## 2016-03-27 DIAGNOSIS — Z800 Family history of malignant neoplasm of digestive organs: Secondary | ICD-10-CM

## 2016-03-27 DIAGNOSIS — R011 Cardiac murmur, unspecified: Secondary | ICD-10-CM | POA: Diagnosis not present

## 2016-03-27 DIAGNOSIS — Z79899 Other long term (current) drug therapy: Secondary | ICD-10-CM

## 2016-03-27 DIAGNOSIS — C73 Malignant neoplasm of thyroid gland: Secondary | ICD-10-CM

## 2016-03-27 DIAGNOSIS — Z87891 Personal history of nicotine dependence: Secondary | ICD-10-CM

## 2016-03-27 LAB — TSH: TSH: 1.551 u[IU]/mL (ref 0.350–4.500)

## 2016-03-27 LAB — T4, FREE: FREE T4: 1.02 ng/dL (ref 0.61–1.12)

## 2016-03-27 NOTE — Progress Notes (Signed)
Patient here today for routine follow up regarding thyroid cancer. Patient reports that she is feeling well overall, is due to be seen by ENT in August. Patient does report she continues to have boils to back of her arm and back side, has prescription for Bactroban ointment but it is not helping.

## 2016-03-27 NOTE — Progress Notes (Signed)
[place]  Telephone:([phone] Fax:(336) [phone]  ID: Leah Rogers OB: 1961/02/15  MR#: [id number]  [id number]  Patient Care Team: Glean Hess, MD as PCP - General (Family Medicine)  CHIEF COMPLAINT: Stage II papillary thyroid carcinoma  INTERVAL HISTORY:  Patient returns to clinic today for repeat laboratory work and further evaluation. She Is being treated by primary care for bowls on her back and arm, but otherwise feels well. She has no neurologic complaints. She denies any recent fevers. She denies any pain. She has no chest pain or shortness of breath. She has a good appetite and denies weight loss. She denies any nausea, vomiting, constipation, or diarrhea. She has no urinary complaints. Patient feels at her baseline and offers no further specific complaints today.  REVIEW OF SYSTEMS:   Review of Systems  Constitutional: Negative.  Negative for fever, weight loss and malaise/fatigue.  HENT: Negative.   Respiratory: Negative.  Negative for shortness of breath.   Cardiovascular: Negative.  Negative for chest pain.  Gastrointestinal: Negative.  Negative for abdominal pain.  Genitourinary: Negative.   Musculoskeletal: Negative.   Skin: Positive for rash.  Neurological: Negative.  Negative for weakness.  Psychiatric/Behavioral: Negative.     As per HPI. Otherwise, a complete review of systems is negatve.  PAST MEDICAL HISTORY: Past Medical History  Diagnosis Date  . Heart murmur   . Arthritis   . Cancer [facility])     Thyroid    PAST SURGICAL HISTORY: Past Surgical History  Procedure Laterality Date  . Endometrial biopsy  2013    benign- post menopausal bleeding  . Tubal ligation    . Thyroidectomy N/A 10/14/2015    Procedure: THYROIDECTOMY;  Surgeon: Margaretha Sheffield, MD;  Location: [facility] ORS;  Service: ENT;  Laterality: N/A;  . Colonoscopy with propofol N/A 03/20/2016    Procedure: COLONOSCOPY WITH PROPOFOL;  Surgeon: Lucilla Lame, MD;   Location: [place];  Service: Endoscopy;  Laterality: N/A;    FAMILY HISTORY Family History  Problem Relation Age of Onset  . Hypertension Mother   . Lung cancer Mother   . Heart attack Father   . Heart attack Brother        ADVANCED DIRECTIVES:    HEALTH MAINTENANCE: Social History  Substance Use Topics  . Smoking status: Former Smoker -- 0.50 packs/day for 18 years    Types: Cigarettes    Quit date: 03/21/2005  . Smokeless tobacco: Never Used  . Alcohol Use: No     Colonoscopy:  PAP:  Bone density:  Lipid panel:  No Known Allergies  Current Outpatient Prescriptions  Medication Sig Dispense Refill  . calcium gluconate 650 MG tablet Take 650 mg by mouth daily.    . Cholecalciferol (VITAMIN D3) 2000 units CHEW Chew by mouth.    . cyanocobalamin 500 MCG tablet Take 500 mcg by mouth daily.    Marland Kitchen levothyroxine (SYNTHROID, LEVOTHROID) 125 MCG tablet Take 125 mcg by mouth daily before breakfast.     . mupirocin ointment (BACTROBAN) 2 % Place 1 application into the nose 2 (two) times daily. 22 g 0   No current facility-administered medications for this visit.    OBJECTIVE: Filed Vitals:   03/27/16 1041  BP: 130/90  Pulse: 79  Temp: 98.3 F (36.8 C)  Resp: 20     Body mass index is 38.77 kg/(m^2).    ECOG FS:0 - Asymptomatic  General: Well-developed, well-nourished, no acute distress. Eyes: Pink conjunctiva, anicteric sclera. HEENT: Normocephalic, moist  mucous membranes, clear oropharnyx.  No palpable lymphadenopathy, fully healed surgical scar. Lungs: Clear to auscultation bilaterally. Heart: Regular rate and rhythm. No rubs, murmurs, or gallops. Abdomen: Soft, nontender, nondistended. No organomegaly noted, normoactive bowel sounds. Musculoskeletal: No edema, cyanosis, or clubbing. Neuro: Alert, answering all questions appropriately. Cranial nerves grossly intact. Skin: Mild maculopapular rash noted, improving. Psych: Normal affect. Lymphatics: No  cervical, calvicular, axillary or inguinal LAD.   LAB RESULTS:  Lab Results  Component Value Date   NA 140 01/02/2016   K 4.2 01/02/2016   CL 99 01/02/2016   CO2 23 01/02/2016   GLUCOSE 79 01/02/2016   BUN 15 01/02/2016   CREATININE 0.65 01/02/2016   CALCIUM 8.4* 01/02/2016   PROT 7.6 01/02/2016   ALBUMIN 4.1 01/02/2016   AST 15 01/02/2016   ALT 14 01/02/2016   ALKPHOS 69 01/02/2016   BILITOT 0.4 01/02/2016   GFRNONAA 100 01/02/2016   GFRAA 115 01/02/2016    Lab Results  Component Value Date   WBC 4.5 01/02/2016   NEUTROABS 2.0 01/02/2016   HGB 12.9 10/07/2015   HCT 38.4 01/02/2016   MCV 92 01/02/2016   PLT 198 01/02/2016     STUDIES: No results found.  ASSESSMENT:  Stage II papillary thyroid carcinoma.  PLAN:     1. Stage II papillary thyroid carcinoma: Whole-body iodide scan from November 28, 2015 without evidence of iodide avid metastatic disease. Patient's thyroglobulin antibodies continue to be less than 1.0. Patient's TSH is elevated, but her T4 and T3 are within normal limits. Today's laboratory work is pending at time of dictation. Continue thyroid supplementation as prescribed. Return to clinic in 3 months with repeat laboratory work and further evaluation. 2. "Boils": Continue topical treatments as prescribed.   Patient expressed understanding and was in agreement with this plan. She also understands that She can call clinic at any time with any questions, concerns, or complaints.   Papillary thyroid carcinoma ([place])   Staging form: Thyroid - Papillary or Follicular (45 years and older), AJCC 7th Edition     Pathologic stage from 10/14/2015: Stage II (T2, N0, cM0) - Signed by Lloyd Huger, MD on 11/03/2015   Lloyd Huger, MD   03/27/2016 11:11 AM

## 2016-03-28 LAB — T3, FREE: T3 FREE: 3.1 pg/mL (ref 2.0–4.4)

## 2016-03-28 LAB — THYROGLOBULIN ANTIBODY: Thyroglobulin Antibody: <1 IU/mL (ref 0.0–0.9)

## 2016-04-22 ENCOUNTER — Telehealth: Payer: Self-pay

## 2016-04-22 DIAGNOSIS — E890 Postprocedural hypothyroidism: Secondary | ICD-10-CM | POA: Diagnosis not present

## 2016-04-22 DIAGNOSIS — Z85850 Personal history of malignant neoplasm of thyroid: Secondary | ICD-10-CM | POA: Diagnosis not present

## 2016-06-24 NOTE — Progress Notes (Signed)
[place]  Telephone:(336) [phone] Fax:(336) [phone]  ID: Leah Rogers OB: 04-02-1961  MR#: [id number]  [id number]  Patient Care Team: Glean Hess, MD as PCP - General (Family Medicine)  CHIEF COMPLAINT: Stage II papillary thyroid carcinoma  INTERVAL HISTORY:  Patient returns to clinic today for repeat laboratory work and further evaluation. She currently feels well and is asymptomatic. She has no neurologic complaints. She denies any recent fevers. She denies any pain. She has no chest pain or shortness of breath. She has a good appetite and denies weight loss. She denies any nausea, vomiting, constipation, or diarrhea. She has no urinary complaints. Patient feels at her baseline and offers no specific complaints today.  REVIEW OF SYSTEMS:   Review of Systems  Constitutional: Negative.  Negative for fever, malaise/fatigue and weight loss.  HENT: Negative.   Respiratory: Negative.  Negative for cough and shortness of breath.   Cardiovascular: Negative.  Negative for chest pain and leg swelling.  Gastrointestinal: Negative.  Negative for abdominal pain.  Genitourinary: Negative.   Musculoskeletal: Negative.   Skin: Negative for rash.  Neurological: Negative.  Negative for weakness.  Psychiatric/Behavioral: Negative.  The patient is not nervous/anxious.     As per HPI. Otherwise, a complete review of systems is negative.  PAST MEDICAL HISTORY: Past Medical History:  Diagnosis Date  . Arthritis   . Cancer ([place])    Thyroid  . Heart murmur     PAST SURGICAL HISTORY: Past Surgical History:  Procedure Laterality Date  . COLONOSCOPY WITH PROPOFOL N/A 03/20/2016   Procedure: COLONOSCOPY WITH PROPOFOL;  Surgeon: Lucilla Lame, MD;  Location: [place];  Service: Endoscopy;  Laterality: N/A;  . ENDOMETRIAL BIOPSY  2013   benign- post menopausal bleeding  . THYROIDECTOMY N/A 10/14/2015   Procedure: THYROIDECTOMY;  Surgeon: Margaretha Sheffield, MD;   Location: [facility] ORS;  Service: ENT;  Laterality: N/A;  . TUBAL LIGATION      FAMILY HISTORY Family History  Problem Relation Age of Onset  . Hypertension Mother   . Lung cancer Mother   . Heart attack Father   . Heart attack Brother        ADVANCED DIRECTIVES:    HEALTH MAINTENANCE: Social History  Substance Use Topics  . Smoking status: Former Smoker    Packs/day: 0.50    Years: 18.00    Types: Cigarettes    Quit date: 03/21/2005  . Smokeless tobacco: Never Used  . Alcohol use No     Colonoscopy:  PAP:  Bone density:  Lipid panel:  No Known Allergies  Current Outpatient Prescriptions  Medication Sig Dispense Refill  . calcium gluconate 650 MG tablet Take 650 mg by mouth daily.    . Cholecalciferol (VITAMIN D3) 2000 units CHEW Chew by mouth.    . cyanocobalamin 500 MCG tablet Take 500 mcg by mouth daily.    Marland Kitchen levothyroxine (SYNTHROID, LEVOTHROID) 125 MCG tablet Take 125 mcg by mouth daily before breakfast.      No current facility-administered medications for this visit.     OBJECTIVE: Vitals:   06/26/16 0941  BP: (!) 156/92  Pulse: 61  Resp: 20  Temp: 97 F (36.1 C)     Body mass index is 39.64 kg/m.    ECOG FS:0 - Asymptomatic  General: Well-developed, well-nourished, no acute distress. Eyes: Pink conjunctiva, anicteric sclera. HEENT: Normocephalic, moist mucous membranes, clear oropharnyx.  No palpable lymphadenopathy, fully healed surgical scar. Lungs: Clear to auscultation bilaterally. Heart: Regular rate  and rhythm. No rubs, murmurs, or gallops. Abdomen: Soft, nontender, nondistended. No organomegaly noted, normoactive bowel sounds. Musculoskeletal: No edema, cyanosis, or clubbing. Neuro: Alert, answering all questions appropriately. Cranial nerves grossly intact. Skin: Mild maculopapular rash noted, improving. Psych: Normal affect. Lymphatics: No cervical, calvicular, axillary or inguinal LAD.   LAB RESULTS:  Lab Results  Component Value  Date   NA 140 01/02/2016   K 4.2 01/02/2016   CL 99 01/02/2016   CO2 23 01/02/2016   GLUCOSE 79 01/02/2016   BUN 15 01/02/2016   CREATININE 0.65 01/02/2016   CALCIUM 8.4 (L) 01/02/2016   PROT 7.6 01/02/2016   ALBUMIN 4.1 01/02/2016   AST 15 01/02/2016   ALT 14 01/02/2016   ALKPHOS 69 01/02/2016   BILITOT 0.4 01/02/2016   GFRNONAA 100 01/02/2016   GFRAA 115 01/02/2016    Lab Results  Component Value Date   WBC 4.5 01/02/2016   NEUTROABS 2.0 01/02/2016   HGB 12.9 10/07/2015   HCT 38.4 01/02/2016   MCV 92 01/02/2016   PLT 198 01/02/2016     STUDIES: No results found.  ASSESSMENT:  Stage II papillary thyroid carcinoma.  PLAN:     1. Stage II papillary thyroid carcinoma: Whole-body iodide scan from November 28, 2015 without evidence of iodide avid metastatic disease. Patient's thyroglobulin antibodies continue to be less than 1.0. Patient's Thyroid panel continues to be within normal limits. Continue current dose of Synthroid. Continue follow-up with ENT as scheduled in January Return to clinic in 6 months with repeat laboratory work and further evaluation. 2. "Boils": Patient states she has an appointment with dermatology in the near future.  Patient expressed understanding and was in agreement with this plan. She also understands that She can call clinic at any time with any questions, concerns, or complaints.   Papillary thyroid carcinoma ([place])   Staging form: Thyroid - Papillary or Follicular (45 years and older), AJCC 7th Edition     Pathologic stage from 10/14/2015: Stage II (T2, N0, cM0) - Signed by Lloyd Huger, MD on 11/03/2015   Lloyd Huger, MD   06/28/2016 9:37 AM

## 2016-06-26 ENCOUNTER — Inpatient Hospital Stay: Payer: BLUE CROSS/BLUE SHIELD | Attending: Oncology

## 2016-06-26 ENCOUNTER — Inpatient Hospital Stay (HOSPITAL_BASED_OUTPATIENT_CLINIC_OR_DEPARTMENT_OTHER): Payer: BLUE CROSS/BLUE SHIELD | Admitting: Oncology

## 2016-06-26 VITALS — BP 156/92 | HR 61 | Temp 97.0°F | Resp 20 | Wt 245.6 lb

## 2016-06-26 DIAGNOSIS — C73 Malignant neoplasm of thyroid gland: Secondary | ICD-10-CM | POA: Diagnosis not present

## 2016-06-26 DIAGNOSIS — M129 Arthropathy, unspecified: Secondary | ICD-10-CM | POA: Insufficient documentation

## 2016-06-26 DIAGNOSIS — Z801 Family history of malignant neoplasm of trachea, bronchus and lung: Secondary | ICD-10-CM

## 2016-06-26 DIAGNOSIS — L0292 Furuncle, unspecified: Secondary | ICD-10-CM | POA: Diagnosis not present

## 2016-06-26 DIAGNOSIS — Z79899 Other long term (current) drug therapy: Secondary | ICD-10-CM | POA: Insufficient documentation

## 2016-06-26 DIAGNOSIS — E890 Postprocedural hypothyroidism: Secondary | ICD-10-CM

## 2016-06-26 DIAGNOSIS — Z87891 Personal history of nicotine dependence: Secondary | ICD-10-CM | POA: Insufficient documentation

## 2016-06-26 DIAGNOSIS — R011 Cardiac murmur, unspecified: Secondary | ICD-10-CM | POA: Diagnosis not present

## 2016-06-26 LAB — TSH: TSH: 0.837 u[IU]/mL (ref 0.350–4.500)

## 2016-06-26 LAB — T4, FREE: FREE T4: 0.93 ng/dL (ref 0.61–1.12)

## 2016-06-26 NOTE — Progress Notes (Signed)
Patient here today for routine follow up regarding thyroid cancer. Patient denies any concerns today.

## 2016-06-27 LAB — T3, FREE: T3, Free: 3 pg/mL (ref 2.0–4.4)

## 2016-06-27 LAB — THYROGLOBULIN ANTIBODY: Thyroglobulin Antibody: <1 IU/mL (ref 0.0–0.9)

## 2016-06-29 DIAGNOSIS — L732 Hidradenitis suppurativa: Secondary | ICD-10-CM | POA: Diagnosis not present

## 2016-08-20 IMAGING — US US SOFT TISSUE HEAD/NECK
1 series · 13 of 25 positions shown · non-contrast
Comparison: None.

CLINICAL DATA: Thyroid nodule by exam.

EXAM:
THYROID ULTRASOUND
TECHNIQUE: Ultrasound examination of the thyroid gland and adjacent soft
tissues was performed.

[Series 1: us soft tissue head/neck · 0.08mm/px · 13 of 60 slices shown]
[im 1/60]
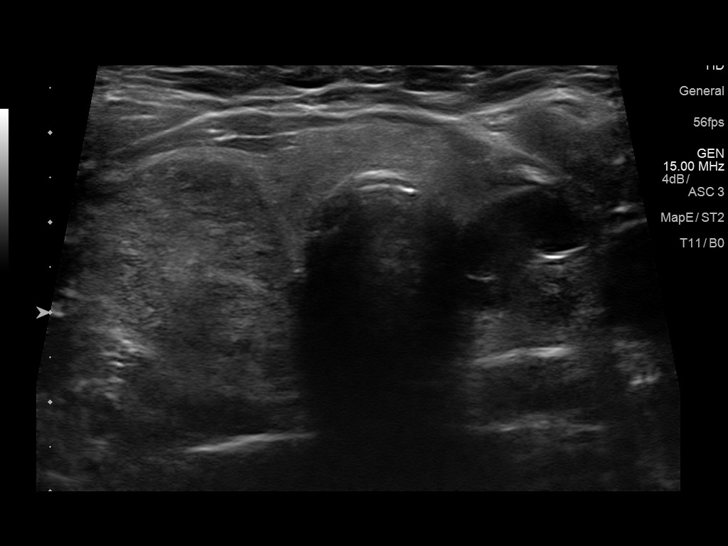
[im 5/60]
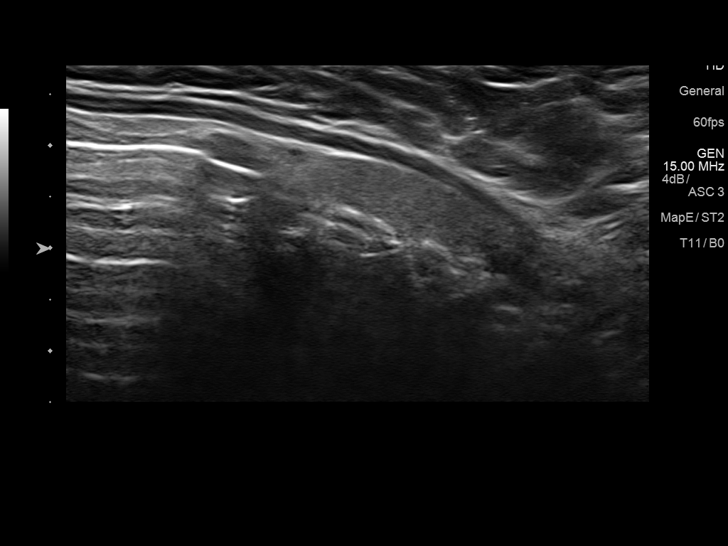
[im 10/60]
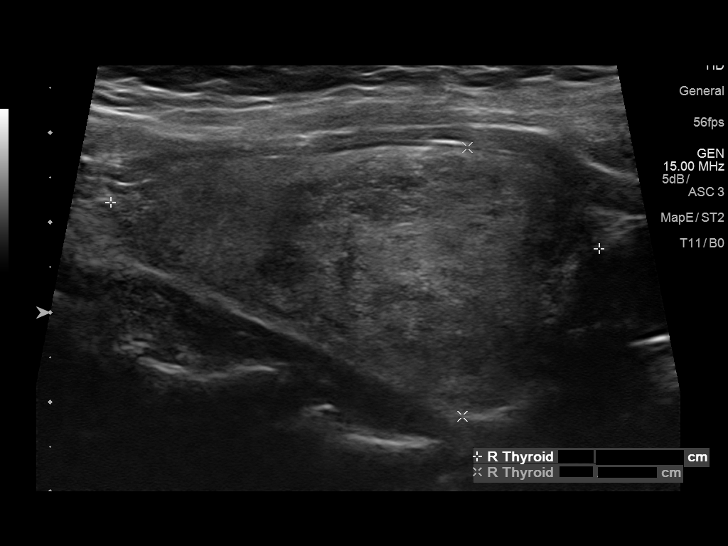
[im 15/60]
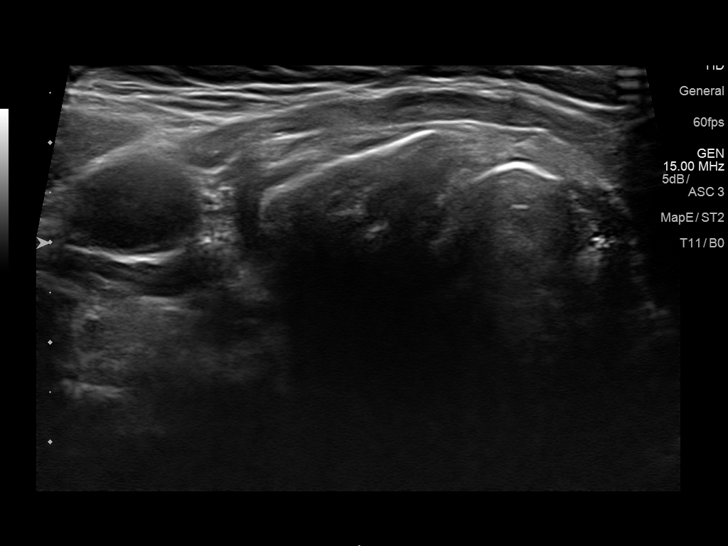
[im 20/60]
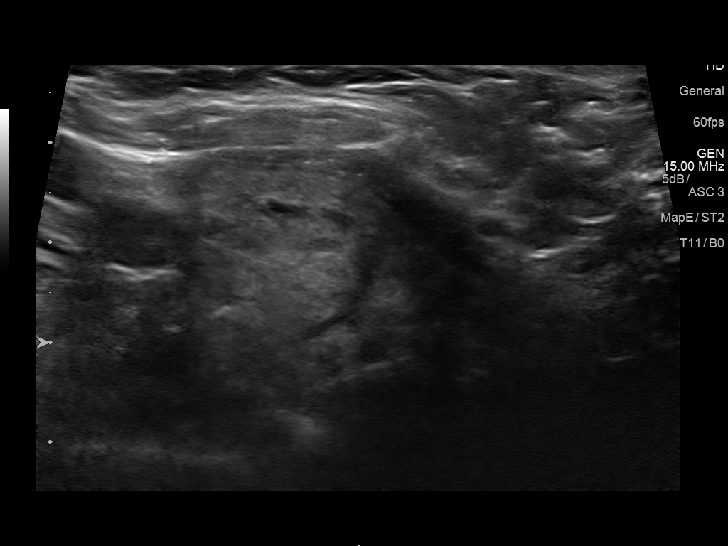
[im 25/60]
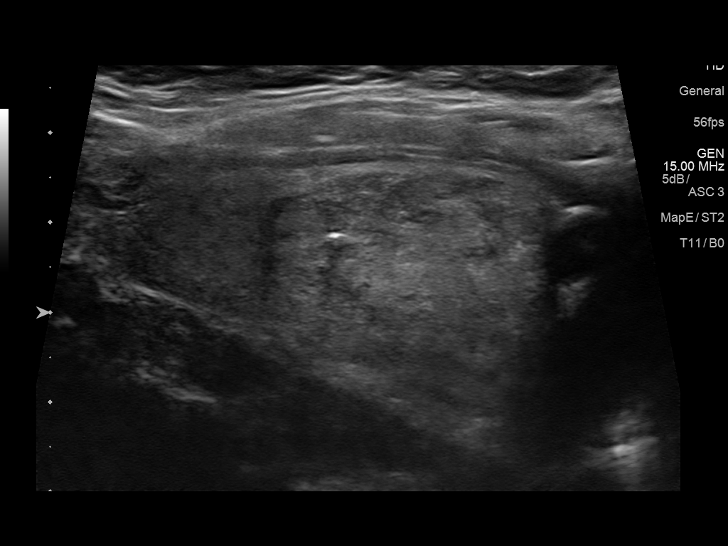
[im 30/60]
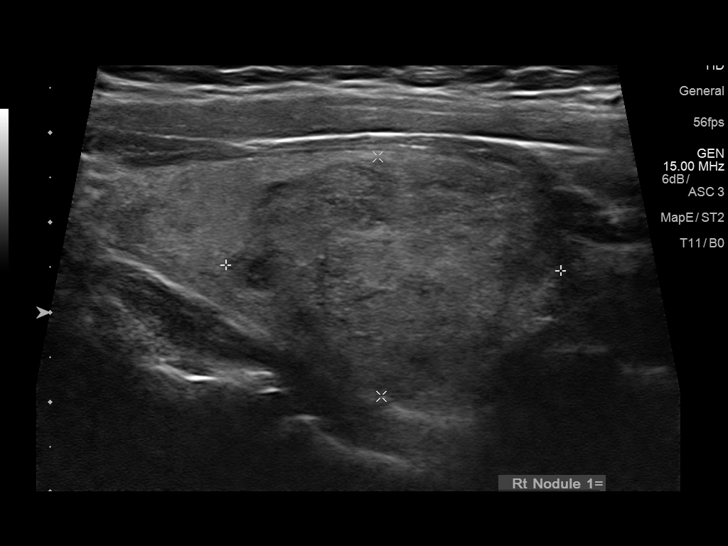
[im 35/60]
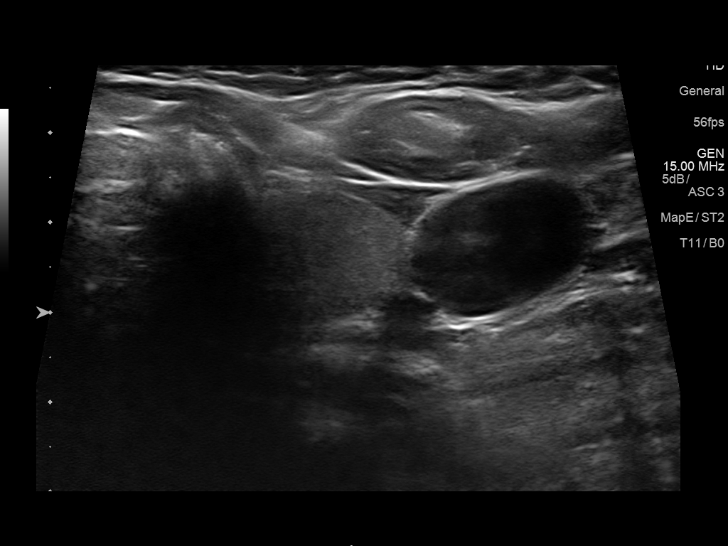
[im 40/60]
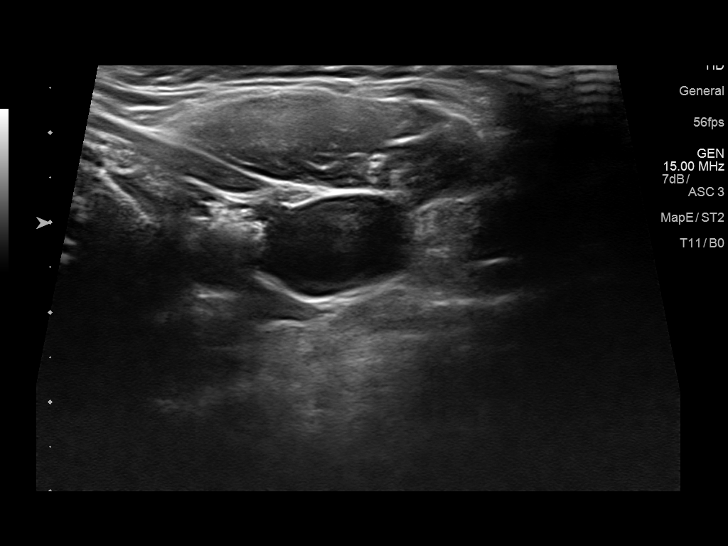
[im 45/60]
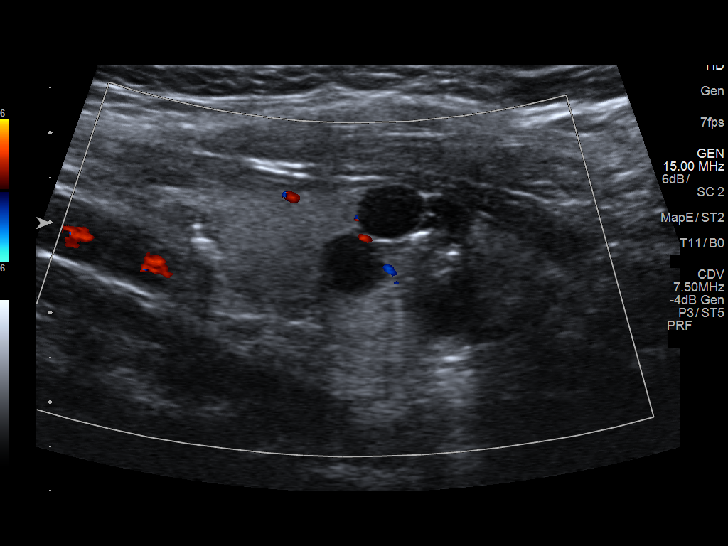
[im 50/60]
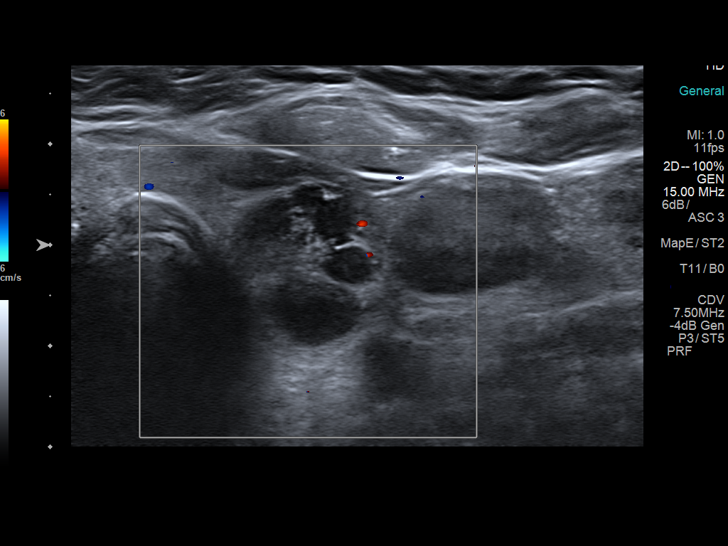
[im 55/60]
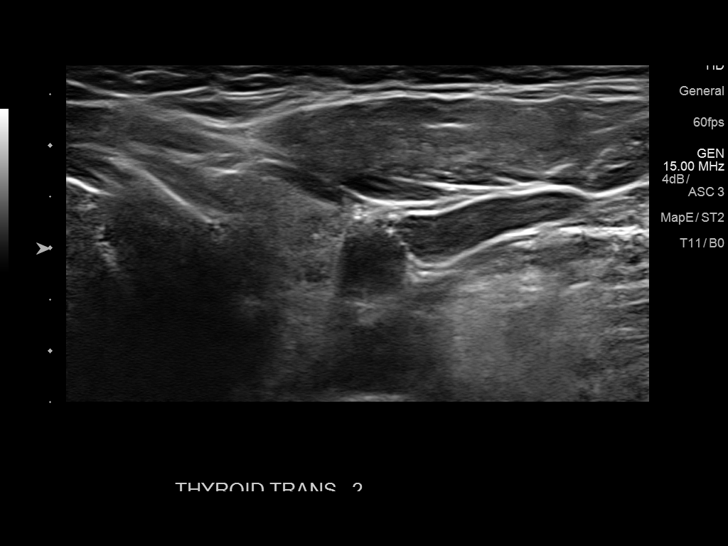
[im 60/60]
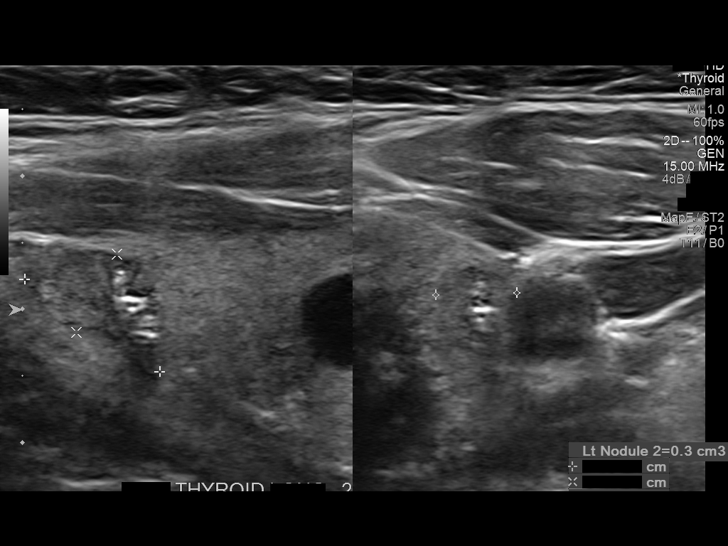

[13 of 25 positions shown; findings below may reference images not displayed]

FINDINGS: Right thyroid lobe

Measurements: 5.5 x 3.0 x 2.6 cm. Heterogeneous background
echotexture.

Large right echogenic solid mid to lower pole mass measures 3.7 x
2.7 x 2.6 cm. This meets criteria for biopsy. No other significant
right thyroid abnormality.

Left thyroid lobe

Measurements: 5.0 x 2.3 x 1.9 cm. Similar heterogeneous echotexture.

Complex septated partially cystic nodule measures 2.0 x 1.6 x 1.7 cm
in the mid to lower pole region. This is the dominant left thyroid
nodule.

Upper pole solid partially calcified nodule measures 12 x 7 x 6 mm.

Isthmus

Thickness: 5 mm.  No nodules visualized.

Lymphadenopathy

None visualized.
IMPRESSION: Dominant nodules bilaterally measuring 3.7 cm on the right and 2 cm
on the left. These meet criteria for biopsy.

Findings meet consensus criteria for biopsy. Ultrasound-guided fine
needle aspiration should be considered, as per the consensus
statement: Management of Thyroid Nodules Detected at US: Society of
Radiologists in Ultrasound Consensus Conference Statement. Radiology

## 2016-08-31 DIAGNOSIS — L732 Hidradenitis suppurativa: Secondary | ICD-10-CM | POA: Diagnosis not present

## 2016-10-27 DIAGNOSIS — Z85850 Personal history of malignant neoplasm of thyroid: Secondary | ICD-10-CM | POA: Diagnosis not present

## 2016-10-27 DIAGNOSIS — E041 Nontoxic single thyroid nodule: Secondary | ICD-10-CM | POA: Diagnosis not present

## 2016-10-27 DIAGNOSIS — E890 Postprocedural hypothyroidism: Secondary | ICD-10-CM | POA: Diagnosis not present

## 2016-11-13 ENCOUNTER — Encounter: Payer: Self-pay | Admitting: Internal Medicine

## 2016-11-13 ENCOUNTER — Ambulatory Visit (INDEPENDENT_AMBULATORY_CARE_PROVIDER_SITE_OTHER): Payer: BLUE CROSS/BLUE SHIELD | Admitting: Internal Medicine

## 2016-11-13 VITALS — BP 112/80 | HR 82 | Ht 66.0 in | Wt 252.0 lb

## 2016-11-13 DIAGNOSIS — M170 Bilateral primary osteoarthritis of knee: Secondary | ICD-10-CM | POA: Diagnosis not present

## 2016-11-13 NOTE — Progress Notes (Signed)
Date:  11/13/2016   Name:  Leah Rogers   DOB:  05-Apr-1961   MRN:  [id number]   Chief Complaint: Knee Pain (Rt knee- X 3 weeks. " looks swollen." No injury to cause pain.) Knee Pain   The incident occurred more than 1 week ago. There was no injury mechanism. The pain is present in the right knee. The quality of the pain is described as aching. The pain is mild. The pain has been fluctuating since onset. Associated symptoms include an inability to bear weight and muscle weakness. Pertinent negatives include no numbness or tingling. The symptoms are aggravated by movement and palpation. She has tried NSAIDs for the symptoms.  She has OA in left knee for which she takes Aleve, has never seen ORtho.  Now right knee is painful and feels unstable.  Wearing a brace while working - on her feet all shift.    Review of Systems  Constitutional: Negative for chills.  Respiratory: Negative for shortness of breath.   Cardiovascular: Positive for leg swelling (mild chronic edema). Negative for chest pain and palpitations.  Musculoskeletal: Positive for arthralgias, gait problem and joint swelling.  Neurological: Negative for dizziness, tingling and numbness.    Patient Active Problem List   Diagnosis Date Noted  . Special screening for malignant neoplasms, colon   . S/P total thyroidectomy 10/14/2015  . Papillary thyroid carcinoma ([place]) 10/01/2015  . Osteoarthritis of knee 09/02/2015  . Hidradenitis axillaris 09/02/2015    Prior to Admission medications   Medication Sig Start Date End Date Taking? Authorizing Provider  calcium gluconate 650 MG tablet Take 650 mg by mouth daily.   Yes Historical Provider, MD  Cholecalciferol (VITAMIN D3) 2000 units CHEW Chew by mouth.   Yes Historical Provider, MD  cyanocobalamin 500 MCG tablet Take 500 mcg by mouth daily.   Yes Historical Provider, MD  levothyroxine (SYNTHROID, LEVOTHROID) 125 MCG tablet Take 125 mcg by mouth daily before breakfast.  11/30/15   Yes Historical Provider, MD    No Known Allergies  Past Surgical History:  Procedure Laterality Date  . COLONOSCOPY WITH PROPOFOL N/A 03/20/2016   Procedure: COLONOSCOPY WITH PROPOFOL;  Surgeon: Lucilla Lame, MD;  Location: [place];  Service: Endoscopy;  Laterality: N/A;  . ENDOMETRIAL BIOPSY  2013   benign- post menopausal bleeding  . THYROIDECTOMY N/A 10/14/2015   Procedure: THYROIDECTOMY;  Surgeon: Margaretha Sheffield, MD;  Location: [facility] ORS;  Service: ENT;  Laterality: N/A;  . TUBAL LIGATION      Social History  Substance Use Topics  . Smoking status: Former Smoker    Packs/day: 0.50    Years: 18.00    Types: Cigarettes    Quit date: 03/21/2005  . Smokeless tobacco: Never Used  . Alcohol use No     Medication list has been reviewed and updated.   Physical Exam  Constitutional: She is oriented to person, place, and time. She appears well-developed. No distress.  HENT:  Head: Normocephalic and atraumatic.  Cardiovascular: Normal rate, regular rhythm and normal heart sounds.   Pulmonary/Chest: Effort normal and breath sounds normal. No respiratory distress.  Musculoskeletal: She exhibits edema (1+ both mid tibias).       Right knee: She exhibits decreased range of motion and swelling. Tenderness found. Medial joint line tenderness noted.  Neurological: She is alert and oriented to person, place, and time.  Skin: Skin is warm and dry. No rash noted.  Psychiatric: She has a normal mood and affect. Her  behavior is normal. Thought content normal.  Nursing note and vitals reviewed.   BP 112/80   Pulse 82   Ht 5\' 6"  (1.676 m)   Wt 252 lb (114.3 kg)   SpO2 97%   BMI 40.67 kg/m   Assessment and Plan: 1. Osteoarthritis of both knees, unspecified osteoarthritis type Can continue Aleve bid and brace - Ambulatory referral to Orthopedic Surgery   No orders of the defined types were placed in this encounter.   Halina Maidens, MD [facility]  11/13/2016

## 2016-11-16 DIAGNOSIS — M170 Bilateral primary osteoarthritis of knee: Secondary | ICD-10-CM | POA: Diagnosis not present

## 2016-12-24 NOTE — Progress Notes (Signed)
[place]  Telephone:(336) [phone] [phone]) [phone]  ID: Leah Rogers OB: 06/12/1961  MR#: [id number]  OAC#:[id number]  Patient Care Team: Glean Hess, MD as PCP - General (Family Medicine)  CHIEF COMPLAINT: Stage II papillary thyroid carcinoma  INTERVAL HISTORY:  Patient returns to clinic today for repeat laboratory work and further evaluation. She currently feels well and is asymptomatic. She has no neurologic complaints. She denies any recent fevers. She denies any pain. She has no chest pain or shortness of breath. She has a good appetite and denies weight loss. She denies any nausea, vomiting, constipation, or diarrhea. She has no urinary complaints. Patient feels at her baseline and offers no specific complaints today.  REVIEW OF SYSTEMS:   Review of Systems  Constitutional: Negative.  Negative for fever, malaise/fatigue and weight loss.  HENT: Negative.   Respiratory: Negative.  Negative for cough and shortness of breath.   Cardiovascular: Negative.  Negative for chest pain and leg swelling.  Gastrointestinal: Negative.  Negative for abdominal pain.  Genitourinary: Negative.   Musculoskeletal: Negative.   Skin: Negative for rash.  Neurological: Negative.  Negative for weakness.  Psychiatric/Behavioral: Negative.  The patient is not nervous/anxious.     As per HPI. Otherwise, a complete review of systems is negative.  PAST MEDICAL HISTORY: Past Medical History:  Diagnosis Date  . Arthritis   . Cancer ([place])    Thyroid  . Heart murmur     PAST SURGICAL HISTORY: Past Surgical History:  Procedure Laterality Date  . COLONOSCOPY WITH PROPOFOL N/A 03/20/2016   Procedure: COLONOSCOPY WITH PROPOFOL;  Surgeon: Lucilla Lame, MD;  Location: [place];  Service: Endoscopy;  Laterality: N/A;  . ENDOMETRIAL BIOPSY  2013   benign- post menopausal bleeding  . THYROIDECTOMY N/A 10/14/2015   Procedure: THYROIDECTOMY;  Surgeon: Margaretha Sheffield, MD;   Location: [facility];  Service: ENT;  Laterality: N/A;  . TUBAL LIGATION      FAMILY HISTORY Family History  Problem Relation Age of Onset  . Hypertension Mother   . Lung cancer Mother   . Heart attack Father   . Heart attack Brother        ADVANCED DIRECTIVES:    HEALTH MAINTENANCE: Social History  Substance Use Topics  . Smoking status: Former Smoker    Packs/day: 0.50    Years: 18.00    Types: Cigarettes    Quit date: 03/21/2005  . Smokeless tobacco: Never Used  . Alcohol use No     Colonoscopy:  PAP:  Bone density:  Lipid panel:  No Known Allergies  Current Outpatient Prescriptions  Medication Sig Dispense Refill  . calcium gluconate 650 MG tablet Take 650 mg by mouth daily.    . Cholecalciferol (VITAMIN D3) 2000 units CHEW Chew by mouth.    . cyanocobalamin 500 MCG tablet Take 500 mcg by mouth daily.    Marland Kitchen levothyroxine (SYNTHROID, LEVOTHROID) 125 MCG tablet Take 125 mcg by mouth daily before breakfast.     . meloxicam (MOBIC) 15 MG tablet      No current facility-administered medications for this visit.     OBJECTIVE: Vitals:   12/25/16 0956  BP: 128/85  Pulse: (!) 59  Resp: 18  Temp: 98 F (36.7 C)     Body mass index is 40.12 kg/m.    ECOG FS:0 - Asymptomatic  General: Well-developed, well-nourished, no acute distress. Eyes: Pink conjunctiva, anicteric sclera. HEENT: Normocephalic, moist mucous membranes, clear oropharnyx.  No palpable lymphadenopathy,well healed surgical  scar. Lungs: Clear to auscultation bilaterally. Heart: Regular rate and rhythm. No rubs, murmurs, or gallops. Abdomen: Soft, nontender, nondistended. No organomegaly noted, normoactive bowel sounds. Musculoskeletal: No edema, cyanosis, or clubbing. Neuro: Alert, answering all questions appropriately. Cranial nerves grossly intact. Skin: No rash or petechiae noted. Psych: Normal affect. Lymphatics: No cervical, calvicular, axillary or inguinal LAD.   LAB RESULTS:  Lab  Results  Component Value Date   NA 140 01/02/2016   K 4.2 01/02/2016   CL 99 01/02/2016   CO2 23 01/02/2016   GLUCOSE 79 01/02/2016   BUN 15 01/02/2016   CREATININE 0.65 01/02/2016   CALCIUM 8.4 (L) 01/02/2016   PROT 7.6 01/02/2016   ALBUMIN 4.1 01/02/2016   AST 15 01/02/2016   ALT 14 01/02/2016   ALKPHOS 69 01/02/2016   BILITOT 0.4 01/02/2016   GFRNONAA 100 01/02/2016   GFRAA 115 01/02/2016    Lab Results  Component Value Date   WBC 4.5 01/02/2016   NEUTROABS 2.0 01/02/2016   HGB 12.9 10/07/2015   HCT 38.4 01/02/2016   MCV 92 01/02/2016   PLT 198 01/02/2016     STUDIES: No results found.  ASSESSMENT:  Stage II papillary thyroid carcinoma.  PLAN:     1. Stage II papillary thyroid carcinoma: Patient had her total thyroidectomy on October 14, 2015. Whole-body iodide scan from November 28, 2015 without evidence of iodide avid metastatic disease. Patient's thyroglobulin antibodies continue to be less than 1.0. Patient's Thyroid panel continues to be within normal limits. Continue current dose of Synthroid. Continue follow-up with ENT as scheduled. Return to clinic in 6 months with repeat laboratory work and further evaluation.  Approximately 20 minutes was spent in discussion of which greater than 50% was consultation.  Patient expressed understanding and was in agreement with this plan. She also understands that She can call clinic at any time with any questions, concerns, or complaints.   Papillary thyroid carcinoma ([place])   Staging form: Thyroid - Papillary or Follicular (45 years and older), AJCC 7th Edition     Pathologic stage from 10/14/2015: Stage II (T2, N0, cM0) - Signed by Lloyd Huger, MD on 11/03/2015   Lloyd Huger, MD   12/31/2016 7:37 PM

## 2016-12-25 ENCOUNTER — Inpatient Hospital Stay: Payer: BLUE CROSS/BLUE SHIELD | Attending: Oncology

## 2016-12-25 ENCOUNTER — Inpatient Hospital Stay (HOSPITAL_BASED_OUTPATIENT_CLINIC_OR_DEPARTMENT_OTHER): Payer: BLUE CROSS/BLUE SHIELD | Admitting: Oncology

## 2016-12-25 VITALS — BP 128/85 | HR 59 | Temp 98.0°F | Resp 18 | Wt 248.6 lb

## 2016-12-25 DIAGNOSIS — Z79899 Other long term (current) drug therapy: Secondary | ICD-10-CM | POA: Insufficient documentation

## 2016-12-25 DIAGNOSIS — Z87891 Personal history of nicotine dependence: Secondary | ICD-10-CM | POA: Insufficient documentation

## 2016-12-25 DIAGNOSIS — R011 Cardiac murmur, unspecified: Secondary | ICD-10-CM | POA: Diagnosis not present

## 2016-12-25 DIAGNOSIS — C73 Malignant neoplasm of thyroid gland: Secondary | ICD-10-CM | POA: Diagnosis not present

## 2016-12-25 DIAGNOSIS — M129 Arthropathy, unspecified: Secondary | ICD-10-CM | POA: Insufficient documentation

## 2016-12-25 DIAGNOSIS — Z801 Family history of malignant neoplasm of trachea, bronchus and lung: Secondary | ICD-10-CM | POA: Diagnosis not present

## 2016-12-25 LAB — T4, FREE: Free T4: 0.86 ng/dL (ref 0.61–1.12)

## 2016-12-25 LAB — TSH: TSH: 1.93 u[IU]/mL (ref 0.350–4.500)

## 2016-12-25 NOTE — Progress Notes (Signed)
Patient is here for follow up, she has no major complaints today.

## 2016-12-26 LAB — THYROGLOBULIN ANTIBODY: Thyroglobulin Antibody: <1 IU/mL (ref 0.0–0.9)

## 2016-12-26 LAB — T3, FREE: T3 FREE: 2.6 pg/mL (ref 2.0–4.4)

## 2017-04-01 DIAGNOSIS — M1712 Unilateral primary osteoarthritis, left knee: Secondary | ICD-10-CM | POA: Diagnosis not present

## 2017-04-01 DIAGNOSIS — M170 Bilateral primary osteoarthritis of knee: Secondary | ICD-10-CM | POA: Diagnosis not present

## 2017-04-01 DIAGNOSIS — M1711 Unilateral primary osteoarthritis, right knee: Secondary | ICD-10-CM | POA: Diagnosis not present

## 2017-04-01 DIAGNOSIS — Z1211 Encounter for screening for malignant neoplasm of colon: Secondary | ICD-10-CM | POA: Insufficient documentation

## 2017-05-21 ENCOUNTER — Other Ambulatory Visit: Payer: BLUE CROSS/BLUE SHIELD

## 2017-06-18 ENCOUNTER — Other Ambulatory Visit: Payer: Self-pay

## 2017-06-18 ENCOUNTER — Inpatient Hospital Stay: Payer: BLUE CROSS/BLUE SHIELD | Attending: Oncology

## 2017-06-18 DIAGNOSIS — Z85850 Personal history of malignant neoplasm of thyroid: Secondary | ICD-10-CM | POA: Diagnosis not present

## 2017-06-18 DIAGNOSIS — C73 Malignant neoplasm of thyroid gland: Secondary | ICD-10-CM

## 2017-06-18 LAB — TSH: TSH: 0.886 u[IU]/mL (ref 0.350–4.500)

## 2017-06-18 LAB — T4, FREE: Free T4: 1.01 ng/dL (ref 0.61–1.12)

## 2017-06-19 LAB — THYROGLOBULIN ANTIBODY: Thyroglobulin Antibody: <1 IU/mL (ref 0.0–0.9)

## 2017-06-19 LAB — T3, FREE: T3, Free: 2.9 pg/mL (ref 2.0–4.4)

## 2017-06-21 NOTE — Progress Notes (Signed)
[place]  Telephone:(336) [phone] [phone]) [phone]  ID: Leah Rogers OB: [birth year]  MR#: [id number]  YTK#:[id number]  Patient Care Team: Glean Hess, MD as PCP - General (Family Medicine)  CHIEF COMPLAINT: Stage II papillary thyroid carcinoma  INTERVAL HISTORY:  Patient returns to clinic today for repeat laboratory work and further evaluation. She currently feels well and is asymptomatic. She has no neurologic complaints. She denies any recent fevers. She denies any pain. She has no chest pain or shortness of breath. She has a good appetite and denies weight loss. She denies any nausea, vomiting, constipation, or diarrhea. She has no urinary complaints. Patient feels at her baseline and offers no specific complaints today.  REVIEW OF SYSTEMS:   Review of Systems  Constitutional: Negative.  Negative for fever, malaise/fatigue and weight loss.  HENT: Negative.   Respiratory: Negative.  Negative for cough and shortness of breath.   Cardiovascular: Negative.  Negative for chest pain and leg swelling.  Gastrointestinal: Negative.  Negative for abdominal pain.  Genitourinary: Negative.   Musculoskeletal: Negative.   Skin: Negative for rash.  Neurological: Negative.  Negative for weakness.  Psychiatric/Behavioral: Negative.  The patient is not nervous/anxious.     As per HPI. Otherwise, a complete review of systems is negative.  PAST MEDICAL HISTORY: Past Medical History:  Diagnosis Date  . Arthritis   . Cancer ([place])    Thyroid  . Heart murmur     PAST SURGICAL HISTORY: Past Surgical History:  Procedure Laterality Date  . COLONOSCOPY WITH PROPOFOL N/A 03/20/2016   Procedure: COLONOSCOPY WITH PROPOFOL;  Surgeon: Lucilla Lame, MD;  Location: [place];  Service: Endoscopy;  Laterality: N/A;  . ENDOMETRIAL BIOPSY  2013   benign- post menopausal bleeding  . THYROIDECTOMY N/A 10/14/2015   Procedure: THYROIDECTOMY;  Surgeon: Margaretha Sheffield, MD;   Location: [facility];  Service: ENT;  Laterality: N/A;  . TUBAL LIGATION      FAMILY HISTORY Family History  Problem Relation Age of Onset  . Hypertension Mother   . Lung cancer Mother   . Heart attack Father   . Heart attack Brother        ADVANCED DIRECTIVES:    HEALTH MAINTENANCE: Social History  Substance Use Topics  . Smoking status: Former Smoker    Packs/day: 0.50    Years: 18.00    Types: Cigarettes    Quit date: 03/21/2005  . Smokeless tobacco: Never Used  . Alcohol use No     Colonoscopy:  PAP:  Bone density:  Lipid panel:  No Known Allergies  Current Outpatient Prescriptions  Medication Sig Dispense Refill  . calcium gluconate 650 MG tablet Take 650 mg by mouth daily.    . Cholecalciferol (VITAMIN D3) 2000 units CHEW Chew by mouth.    . cyanocobalamin 500 MCG tablet Take 500 mcg by mouth daily.    Marland Kitchen levothyroxine (SYNTHROID, LEVOTHROID) 125 MCG tablet Take 125 mcg by mouth daily before breakfast.     . meloxicam (MOBIC) 15 MG tablet      No current facility-administered medications for this visit.     OBJECTIVE: Vitals:   06/25/17 1039  BP: 139/85  Pulse: 65  Resp: 18  Temp: 97.8 F (36.6 C)     Body mass index is 40 kg/m.    ECOG FS:0 - Asymptomatic  General: Well-developed, well-nourished, no acute distress. Eyes: Pink conjunctiva, anicteric sclera. HEENT: Normocephalic, moist mucous membranes, clear oropharnyx.  No palpable lymphadenopathy, well healed surgical  scar. Lungs: Clear to auscultation bilaterally. Heart: Regular rate and rhythm. No rubs, murmurs, or gallops. Abdomen: Soft, nontender, nondistended. No organomegaly noted, normoactive bowel sounds. Musculoskeletal: No edema, cyanosis, or clubbing. Neuro: Alert, answering all questions appropriately. Cranial nerves grossly intact. Skin: No rash or petechiae noted. Psych: Normal affect. Lymphatics: No cervical, calvicular, axillary or inguinal LAD.   LAB RESULTS:  Lab Results    Component Value Date   NA 140 01/02/2016   K 4.2 01/02/2016   CL 99 01/02/2016   CO2 23 01/02/2016   GLUCOSE 79 01/02/2016   BUN 15 01/02/2016   CREATININE 0.65 01/02/2016   CALCIUM 8.4 (L) 01/02/2016   PROT 7.6 01/02/2016   ALBUMIN 4.1 01/02/2016   AST 15 01/02/2016   ALT 14 01/02/2016   ALKPHOS 69 01/02/2016   BILITOT 0.4 01/02/2016   GFRNONAA 100 01/02/2016   GFRAA 115 01/02/2016    Lab Results  Component Value Date   WBC 4.5 01/02/2016   NEUTROABS 2.0 01/02/2016   HGB 12.7 01/02/2016   HCT 38.4 01/02/2016   MCV 92 01/02/2016   PLT 198 01/02/2016     STUDIES: No results found.  ASSESSMENT:  Stage II papillary thyroid carcinoma.  PLAN:     1. Stage II papillary thyroid carcinoma: Patient had her total thyroidectomy on October 14, 2015. Whole-body iodide scan from November 28, 2015 without evidence of iodide avid metastatic disease. Patient's thyroglobulin antibodies continue to be less than 1.0. Patient's thyroid panel continues to be within normal limits. Continue 125 g Synthroid. Continue follow-up with ENT as scheduled. Return to clinic in 6 months with repeat laboratory work and further evaluation. Patient will then be 2 years removed from her iodide ablation and can likely be switched to yearly visits.  Approximately 20 minutes was spent in discussion of which greater than 50% was consultation.  Patient expressed understanding and was in agreement with this plan. She also understands that She can call clinic at any time with any questions, concerns, or complaints.   Papillary thyroid carcinoma ([place])   Staging form: Thyroid - Papillary or Follicular (45 years and older), AJCC 7th Edition     Pathologic stage from 10/14/2015: Stage II (T2, N0, cM0) - Signed by Lloyd Huger, MD on 11/03/2015   Lloyd Huger, MD   06/25/2017 11:17 AM

## 2017-06-25 ENCOUNTER — Inpatient Hospital Stay: Payer: BLUE CROSS/BLUE SHIELD | Attending: Oncology | Admitting: Oncology

## 2017-06-25 ENCOUNTER — Other Ambulatory Visit: Payer: BLUE CROSS/BLUE SHIELD

## 2017-06-25 VITALS — BP 139/85 | HR 65 | Temp 97.8°F | Resp 18 | Wt 247.8 lb

## 2017-06-25 DIAGNOSIS — Z801 Family history of malignant neoplasm of trachea, bronchus and lung: Secondary | ICD-10-CM | POA: Diagnosis not present

## 2017-06-25 DIAGNOSIS — Z79899 Other long term (current) drug therapy: Secondary | ICD-10-CM

## 2017-06-25 DIAGNOSIS — C73 Malignant neoplasm of thyroid gland: Secondary | ICD-10-CM

## 2017-06-25 DIAGNOSIS — Z87891 Personal history of nicotine dependence: Secondary | ICD-10-CM

## 2017-06-25 DIAGNOSIS — M1990 Unspecified osteoarthritis, unspecified site: Secondary | ICD-10-CM

## 2017-06-25 NOTE — Progress Notes (Signed)
Patient denies any concerns today.  

## 2017-10-03 IMAGING — CT NM PET TUM IMG INITIAL (PI) SKULL BASE T - THIGH
1 of 9 series · 1 of 25 positions shown · non-contrast
Comparison: None

CLINICAL DATA: Initial Treatment strategy for papillary thyroid
cancer.

EXAM:
NUCLEAR MEDICINE PET SKULL BASE TO THIGH
TECHNIQUE: 12.25 mCi F-18 FDG was injected intravenously. Full-ring PET imaging
was performed from the skull base to thigh after the radiotracer. CT
data was obtained and used for attenuation correction and anatomic
localization.
FASTING BLOOD GLUCOSE:  Value: 86 mg/dl

[Series 3: ct wb 5.0 b30f · axial · 5.0mm · 0.98mm/px · 1 of 290 slices shown]
[im 290/290  brain]
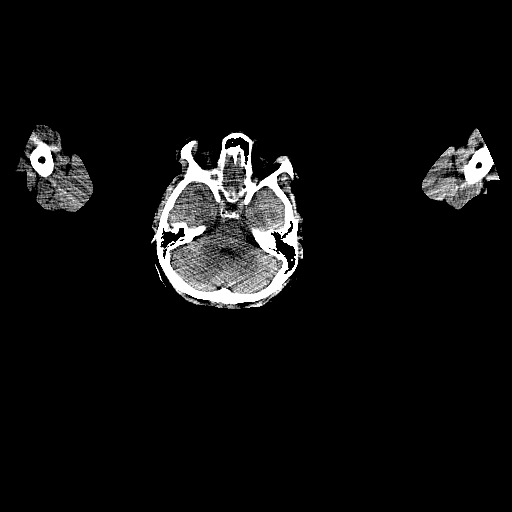

[1 of 25 positions shown; findings below may reference images not displayed]

FINDINGS: NECK

No hypermetabolic lymph nodes identified within the soft tissues of
the neck. Postsurgical changes from recent thyroidectomy identified.

CHEST

Aortic atherosclerosis noted. Calcification is identified within the
LAD coronary artery. No hypermetabolic mediastinal or hilar nodes.
No suspicious pulmonary nodules on the CT scan.

ABDOMEN/PELVIS

No abnormal hypermetabolic activity within the liver, pancreas,
adrenal glands, or spleen. No hypermetabolic lymph nodes in the
abdomen or pelvis.

SKELETON

No focal hypermetabolic activity to suggest skeletal metastasis.
IMPRESSION: 1. Postsurgical changes identified within the thyroid bed.
2. No specific findings identified to suggest FDG avid thyroid
cancer metastases.
3. Aortic atherosclerosis noted.

## 2017-10-27 DIAGNOSIS — E890 Postprocedural hypothyroidism: Secondary | ICD-10-CM | POA: Diagnosis not present

## 2017-10-27 DIAGNOSIS — Z85850 Personal history of malignant neoplasm of thyroid: Secondary | ICD-10-CM | POA: Diagnosis not present

## 2017-10-29 DIAGNOSIS — L732 Hidradenitis suppurativa: Secondary | ICD-10-CM | POA: Diagnosis not present

## 2017-10-29 DIAGNOSIS — L720 Epidermal cyst: Secondary | ICD-10-CM | POA: Diagnosis not present

## 2017-12-17 ENCOUNTER — Inpatient Hospital Stay: Payer: BLUE CROSS/BLUE SHIELD | Attending: Oncology

## 2017-12-17 DIAGNOSIS — C73 Malignant neoplasm of thyroid gland: Secondary | ICD-10-CM | POA: Diagnosis not present

## 2017-12-18 LAB — THYROID PANEL WITH TSH
FREE THYROXINE INDEX: 1.9 (ref 1.2–4.9)
T3 Uptake Ratio: 25 % (ref 24–39)
T4, Total: 7.5 ug/dL (ref 4.5–12.0)
TSH: 5.89 u[IU]/mL — ABNORMAL HIGH (ref 0.450–4.500)

## 2017-12-18 LAB — THYROGLOBULIN ANTIBODY

## 2017-12-22 NOTE — Progress Notes (Signed)
[place]  Telephone:(336) [phone] Fax:(336) [phone]  ID: Leah Rogers OB: 06-May-1961  MR#: [id number]  KCL#:[id number]  Patient Care Team: Glean Hess, MD as PCP - General (Family Medicine)  CHIEF COMPLAINT: Stage II papillary thyroid carcinoma  INTERVAL HISTORY:  Patient returns to clinic today for repeat laboratory work and routine 96-month evaluation. She continues to feel well and is asymptomatic. She has no neurologic complaints. She denies any recent fevers. She denies any pain. She has no chest pain or shortness of breath. She has a good appetite and denies weight loss. She denies any nausea, vomiting, constipation, or diarrhea. She has no urinary complaints. Patient feels at her baseline and offers no specific complaints today.  REVIEW OF SYSTEMS:   Review of Systems  Constitutional: Negative.  Negative for fever, malaise/fatigue and weight loss.  HENT: Negative.   Respiratory: Negative.  Negative for cough and shortness of breath.   Cardiovascular: Negative.  Negative for chest pain and leg swelling.  Gastrointestinal: Negative.  Negative for abdominal pain.  Genitourinary: Negative.   Musculoskeletal: Negative.   Skin: Negative for rash.  Neurological: Negative.  Negative for weakness.  Psychiatric/Behavioral: Negative.  The patient is not nervous/anxious.     As per HPI. Otherwise, a complete review of systems is negative.  PAST MEDICAL HISTORY: Past Medical History:  Diagnosis Date  . Arthritis   . Cancer ([place])    Thyroid  . Heart murmur     PAST SURGICAL HISTORY: Past Surgical History:  Procedure Laterality Date  . COLONOSCOPY WITH PROPOFOL N/A 03/20/2016   Procedure: COLONOSCOPY WITH PROPOFOL;  Surgeon: Lucilla Lame, MD;  Location: [place];  Service: Endoscopy;  Laterality: N/A;  . ENDOMETRIAL BIOPSY  2013   benign- post menopausal bleeding  . THYROIDECTOMY N/A 10/14/2015   Procedure: THYROIDECTOMY;  Surgeon: Margaretha Sheffield, MD;  Location: [facility];  Service: ENT;  Laterality: N/A;  . TUBAL LIGATION      FAMILY HISTORY Family History  Problem Relation Age of Onset  . Hypertension Mother   . Lung cancer Mother   . Heart attack Father   . Heart attack Brother        ADVANCED DIRECTIVES:    HEALTH MAINTENANCE: Social History   Tobacco Use  . Smoking status: Former Smoker    Packs/day: 0.50    Years: 18.00    Pack years: 9.00    Types: Cigarettes    Last attempt to quit: 03/21/2005    Years since quitting: 12.7  . Smokeless tobacco: Never Used  Substance Use Topics  . Alcohol use: No    Alcohol/week: 0.0 oz  . Drug use: No     Colonoscopy:  PAP:  Bone density:  Lipid panel:  No Known Allergies  Current Outpatient Medications  Medication Sig Dispense Refill  . calcium gluconate 650 MG tablet Take 650 mg by mouth daily.    . Cholecalciferol (VITAMIN D3) 2000 units CHEW Chew by mouth.    . cyanocobalamin 500 MCG tablet Take 500 mcg by mouth daily.    Marland Kitchen levothyroxine (SYNTHROID, LEVOTHROID) 125 MCG tablet Take 125 mcg by mouth daily before breakfast.     . meloxicam (MOBIC) 15 MG tablet      No current facility-administered medications for this visit.     OBJECTIVE: Vitals:   12/24/17 1042  BP: (!) 146/95  Pulse: 78  Resp: 18  Temp: 97.9 F (36.6 C)     Body mass index is 40.28 kg/m.  ECOG FS:0 - Asymptomatic  General: Well-developed, well-nourished, no acute distress. Eyes: Pink conjunctiva, anicteric sclera. HEENT: Normocephalic, moist mucous membranes, clear oropharnyx.  No palpable lymphadenopathy, well healed surgical scar. Lungs: Clear to auscultation bilaterally. Heart: Regular rate and rhythm. No rubs, murmurs, or gallops. Abdomen: Soft, nontender, nondistended. No organomegaly noted, normoactive bowel sounds. Musculoskeletal: No edema, cyanosis, or clubbing. Neuro: Alert, answering all questions appropriately. Cranial nerves grossly intact. Skin: No rash  or petechiae noted. Psych: Normal affect.  LAB RESULTS:  Lab Results  Component Value Date   NA 140 01/02/2016   K 4.2 01/02/2016   CL 99 01/02/2016   CO2 23 01/02/2016   GLUCOSE 79 01/02/2016   BUN 15 01/02/2016   CREATININE 0.65 01/02/2016   CALCIUM 8.4 (L) 01/02/2016   PROT 7.6 01/02/2016   ALBUMIN 4.1 01/02/2016   AST 15 01/02/2016   ALT 14 01/02/2016   ALKPHOS 69 01/02/2016   BILITOT 0.4 01/02/2016   GFRNONAA 100 01/02/2016   GFRAA 115 01/02/2016    Lab Results  Component Value Date   WBC 4.5 01/02/2016   NEUTROABS 2.0 01/02/2016   HGB 12.7 01/02/2016   HCT 38.4 01/02/2016   MCV 92 01/02/2016   PLT 198 01/02/2016     STUDIES: No results found.  ASSESSMENT:  Stage II papillary thyroid carcinoma.  PLAN:     1. Stage II papillary thyroid carcinoma: Patient had her total thyroidectomy on October 14, 2015. Whole-body iodide scan from November 28, 2015 without evidence of iodide avid metastatic disease. Patient's thyroglobulin antibodies continue to be less than 1.0. Patient's thyroid panel continues to be within normal limits. Continue 125 g Synthroid. Continue follow-up with ENT as scheduled.  Patient is now 2 years removed from her surgery and can be switched to yearly visits which will continue until she is 5 years out in January 2022.  Return to clinic in 1 year.  Approximately 20 minutes was spent in discussion of which greater than 50% was consultation.  Patient expressed understanding and was in agreement with this plan. She also understands that She can call clinic at any time with any questions, concerns, or complaints.   Papillary thyroid carcinoma ([place])   Staging form: Thyroid - Papillary or Follicular (45 years and older), AJCC 7th Edition     Pathologic stage from 10/14/2015: Stage II (T2, N0, cM0) - Signed by Lloyd Huger, MD on 11/03/2015   Lloyd Huger, MD   12/24/2017 2:26 PM

## 2017-12-24 ENCOUNTER — Inpatient Hospital Stay: Payer: BLUE CROSS/BLUE SHIELD | Attending: Oncology | Admitting: Oncology

## 2017-12-24 VITALS — BP 146/95 | HR 78 | Temp 97.9°F | Resp 18 | Wt 249.6 lb

## 2017-12-24 DIAGNOSIS — C73 Malignant neoplasm of thyroid gland: Secondary | ICD-10-CM

## 2017-12-24 DIAGNOSIS — M1990 Unspecified osteoarthritis, unspecified site: Secondary | ICD-10-CM | POA: Diagnosis not present

## 2017-12-24 DIAGNOSIS — Z801 Family history of malignant neoplasm of trachea, bronchus and lung: Secondary | ICD-10-CM | POA: Diagnosis not present

## 2017-12-24 DIAGNOSIS — Z79899 Other long term (current) drug therapy: Secondary | ICD-10-CM | POA: Diagnosis not present

## 2017-12-24 DIAGNOSIS — Z87891 Personal history of nicotine dependence: Secondary | ICD-10-CM | POA: Diagnosis not present

## 2018-10-15 ENCOUNTER — Ambulatory Visit
Admission: EM | Admit: 2018-10-15 | Discharge: 2018-10-15 | Disposition: A | Discharge status: Home / Self Care | Payer: BLUE CROSS/BLUE SHIELD | Attending: Emergency Medicine | Admitting: Emergency Medicine

## 2018-10-15 ENCOUNTER — Other Ambulatory Visit: Payer: Self-pay

## 2018-10-15 DIAGNOSIS — L02212 Cutaneous abscess of back [any part, except buttock]: Secondary | ICD-10-CM | POA: Diagnosis not present

## 2018-10-15 DIAGNOSIS — L723 Sebaceous cyst: Secondary | ICD-10-CM | POA: Diagnosis not present

## 2018-10-15 DIAGNOSIS — L089 Local infection of the skin and subcutaneous tissue, unspecified: Secondary | ICD-10-CM | POA: Diagnosis not present

## 2018-10-15 MED ORDER — MUPIROCIN 2 % EX OINT: 1.0000 "application " | TOPICAL_OINTMENT | Freq: Three times a day (TID) | CUTANEOUS | 0 refills | Status: DC

## 2018-10-15 MED ORDER — DOXYCYCLINE HYCLATE 100 MG PO CAPS: 100.0000 mg | ORAL_CAPSULE | Freq: Two times a day (BID) | ORAL | 0 refills | Status: DC

## 2018-10-15 NOTE — ED Triage Notes (Signed)
Pt with cyst on her left upper back for years. Now has been much larger and red and draining.

## 2018-10-15 NOTE — Discharge Instructions (Addendum)
Keep dry until return backing removal in 2 days. Expect continued drainage ;you may have to reinforce the bandaging. Follow-up in days for drain removal and possible repacking.

## 2018-10-15 NOTE — ED Provider Notes (Signed)
[facility]    CSN: [id number] Arrival date & time: 10/15/18  0932     History   Chief Complaint Chief Complaint  Patient presents with  . Cyst    HPI Leah Rogers is a 58 y.o. female.   HPI  58 year old female presents with a infected cyst on her left upper back.  For about a week and a half.  She attempted to wheeze it after it was becoming larger.  She used a [employer] representative.  Shortly afterwards she started having increasing pain and mild drainage.  The cyst has been there for years and her dermatologist told her not to do anything unless it became large and infected.  Not running fever.         Past Medical History:  Diagnosis Date  . Arthritis   . Cancer ([place])    Thyroid  . Heart murmur     Patient Active Problem List   Diagnosis Date Noted  . Special screening for malignant neoplasms, colon   . S/P total thyroidectomy 10/14/2015  . Papillary thyroid carcinoma ([place]) 10/01/2015  . Osteoarthritis of knee 09/02/2015  . Hidradenitis axillaris 09/02/2015    Past Surgical History:  Procedure Laterality Date  . COLONOSCOPY WITH PROPOFOL N/A 03/20/2016   Procedure: COLONOSCOPY WITH PROPOFOL;  Surgeon: Lucilla Lame, MD;  Location: [facility];  Service: Endoscopy;  Laterality: N/A;  . ENDOMETRIAL BIOPSY  2013   benign- post menopausal bleeding  . THYROIDECTOMY N/A 10/14/2015   Procedure: THYROIDECTOMY;  Surgeon: Margaretha Sheffield, MD;  Location: [facility];  Service: ENT;  Laterality: N/A;  . TUBAL LIGATION      OB History   No obstetric history on file.      Home Medications    Prior to Admission medications   Medication Sig Start Date End Date Taking? Authorizing Provider  ferrous sulfate 325 (65 FE) MG tablet Take 325 mg by mouth daily with breakfast.   Yes [provider]  calcium gluconate 650 MG tablet Take 650 mg by mouth daily.    [provider]  Cholecalciferol (VITAMIN D3) 2000 units CHEW Chew by mouth.     [provider]  cyanocobalamin 500 MCG tablet Take 500 mcg by mouth daily.    [provider]  doxycycline (VIBRAMYCIN) 100 MG capsule Take 1 capsule (100 mg total) by mouth 2 (two) times daily. 10/15/18   Lorin Picket, PA-C  levothyroxine (SYNTHROID, LEVOTHROID) 125 MCG tablet Take 125 mcg by mouth daily before breakfast.  11/30/15   [provider]  mupirocin ointment (BACTROBAN) 2 % Apply 1 application topically 3 (three) times daily. 10/15/18   Lorin Picket, PA-C    Family History Family History  Problem Relation Age of Onset  . Hypertension Mother   . Lung cancer Mother   . Heart attack Father   . Heart attack Brother     Social History Social History   Tobacco Use  . Smoking status: Former Smoker    Packs/day: 0.50    Years: 18.00    Pack years: 9.00    Types: Cigarettes    Last attempt to quit: 03/21/2005    Years since quitting: 13.5  . Smokeless tobacco: Never Used  Substance Use Topics  . Alcohol use: No    Alcohol/week: 0.0 standard drinks  . Drug use: No     Allergies   Patient has no known allergies.   Review of Systems Review of Systems  Constitutional: Positive  for activity change. Negative for appetite change, chills, fatigue and fever.  Skin: Positive for color change and wound.  All other systems reviewed and are negative.    Physical Exam Triage Vital Signs ED Triage Vitals  Enc Vitals Group     BP 10/15/18 0953 (!) 146/81     Pulse Rate 10/15/18 0953 63     Resp 10/15/18 0953 16     Temp 10/15/18 0953 98.2 F (36.8 C)     Temp Source 10/15/18 0953 Oral     SpO2 10/15/18 0953 97 %     Weight 10/15/18 0955 240 lb (108.9 kg)     Height 10/15/18 0955 5\' 5"  (1.651 m)     Head Circumference --      Peak Flow --      Pain Score 10/15/18 0955 8     Pain Loc --      Pain Edu? --      Excl. in [place]? --    No data found.  Updated Vital Signs BP (!) 146/81 (BP Location: Right Arm)   Pulse 63   Temp 98.2 F  (36.8 C) (Oral)   Resp 16   Ht 5\' 5"  (1.651 m)   Wt 240 lb (108.9 kg)   SpO2 97%   BMI 39.94 kg/m   Visual Acuity Right Eye Distance:   Left Eye Distance:   Bilateral Distance:    Right Eye Near:   Left Eye Near:    Bilateral Near:     Physical Exam Vitals signs and nursing note reviewed.  Constitutional:      General: She is not in acute distress.    Appearance: Normal appearance. She is obese. She is not ill-appearing, toxic-appearing or diaphoretic.  HENT:     Head: Normocephalic.     Nose: Nose normal.  Eyes:     General:        Right eye: No discharge.        Left eye: No discharge.     Conjunctiva/sclera: Conjunctivae normal.  Neck:     Musculoskeletal: Normal range of motion and neck supple.  Pulmonary:     Effort: Pulmonary effort is normal.  Musculoskeletal: Normal range of motion.  Skin:    General: Skin is warm and dry.     Findings: Lesion present.     Comments: She has a large fluctuant and indurated hematemesis cysts on the left upper back.  Very tense.  Not currently draining.  He is afebrile.  Measures approximately 2 inches in diameter.  Neurological:     General: No focal deficit present.     Mental Status: She is alert and oriented to person, place, and time.  Psychiatric:        Mood and Affect: Mood normal.        Behavior: Behavior normal.        Thought Content: Thought content normal.        Judgment: Judgment normal.      UC Treatments / Results  Labs (all labs ordered are listed, but only abnormal results are displayed) Labs Reviewed  AEROBIC/ANAEROBIC CULTURE (SURGICAL/DEEP WOUND)    EKG None  Radiology No results found.  Procedures Incision and Drainage Date/Time: 10/15/2018 6:34 PM Performed by: Lorin Picket, PA-C Authorized by: Melynda Ripple, MD   Consent:    Consent obtained:  Verbal   Consent given by:  Patient   Risks discussed:  Bleeding, incomplete drainage and pain Location:    Type:  Abscess    Size:  5 cm   Location:  Trunk   Trunk location:  Back Pre-procedure details:    Skin preparation:  Betadine Anesthesia (see MAR for exact dosages):    Anesthesia method:  Local infiltration   Local anesthetic:  Lidocaine 1% WITH epi Procedure type:    Complexity:  Complex Procedure details:    Needle aspiration: no     Incision types:  Single straight   Incision depth:  Subcutaneous   Scalpel blade:  11   Wound management:  Probed and deloculated and extensive cleaning   Drainage:  Purulent   Drainage amount:  Copious   Wound treatment:  Drain placed   Packing materials:  1/2 in iodoform gauze   Amount 1/2" iodoform:  6 Post-procedure details:    Patient tolerance of procedure:  Tolerated well, no immediate complications Comments:     In 2 days for removal of packing and repacking.   (including critical care time)  Medications Ordered in UC Medications - No data to display  Initial Impression / Assessment and Plan / UC Course  I have reviewed the triage vital signs and the nursing notes.  Pertinent labs & imaging results that were available during my care of the patient were reviewed by me and considered in my medical decision making (see chart for details).   Return in 2 days for a goal of the packing and repacking again.  Need surgical evaluation for removal of the cyst.  Start her on doxycycline to keep it dry until her return.  Reinforce dressings as necessary.   Final Clinical Impressions(s) / UC Diagnoses   Final diagnoses:  Infected sebaceous cyst of skin     Discharge Instructions     Keep dry until return backing removal in 2 days. Expect continued drainage ;you may have to reinforce the bandaging. Follow-up in days for drain removal and possible repacking.   ED Prescriptions    Medication Sig Dispense Auth. Provider   doxycycline (VIBRAMYCIN) 100 MG capsule Take 1 capsule (100 mg total) by mouth 2 (two) times daily. 14 capsule Crecencio Mc P, PA-C     mupirocin ointment (BACTROBAN) 2 % Apply 1 application topically 3 (three) times daily. 22 g Lorin Picket, PA-C     Controlled Substance Prescriptions [place] Controlled Substance Registry consulted? Not Applicable   Lorin Picket, PA-C 10/15/18 3734

## 2018-10-17 ENCOUNTER — Encounter: Payer: Self-pay | Admitting: Emergency Medicine

## 2018-10-17 ENCOUNTER — Ambulatory Visit
Admission: EM | Admit: 2018-10-17 | Discharge: 2018-10-17 | Disposition: A | Discharge status: Home / Self Care | Payer: BLUE CROSS/BLUE SHIELD | Attending: Family Medicine | Admitting: Family Medicine

## 2018-10-17 DIAGNOSIS — Z87891 Personal history of nicotine dependence: Secondary | ICD-10-CM

## 2018-10-17 DIAGNOSIS — Z5189 Encounter for other specified aftercare: Secondary | ICD-10-CM

## 2018-10-17 DIAGNOSIS — Z4800 Encounter for change or removal of nonsurgical wound dressing: Secondary | ICD-10-CM

## 2018-10-17 NOTE — ED Triage Notes (Signed)
Patient here today for removal of packing to her left upper back and possibly to have it repacked. She was here on 01/25 and had a cyst drained.

## 2018-10-17 NOTE — Discharge Instructions (Signed)
Follow up in 2-3 days for wound recheck

## 2018-10-17 NOTE — ED Provider Notes (Signed)
[facility]    CSN: [id number] Arrival date & time: 10/17/18  0854     History   Chief Complaint Chief Complaint  Patient presents with  . Wound Check    HPI Leah Rogers is a 58 y.o. female.   58 yo female with a c/o abscess wound recheck. Patient was seen here several days ago with abscess on back and had I&D. Patient states she's feeling better. Denies any fevers or chills.   The history is provided by the patient.  Wound Check     Past Medical History:  Diagnosis Date  . Arthritis   . Cancer ([place])    Thyroid  . Heart murmur     Patient Active Problem List   Diagnosis Date Noted  . Special screening for malignant neoplasms, colon   . S/P total thyroidectomy 10/14/2015  . Papillary thyroid carcinoma ([place]) 10/01/2015  . Osteoarthritis of knee 09/02/2015  . Hidradenitis axillaris 09/02/2015    Past Surgical History:  Procedure Laterality Date  . COLONOSCOPY WITH PROPOFOL N/A 03/20/2016   Procedure: COLONOSCOPY WITH PROPOFOL;  Surgeon: Lucilla Lame, MD;  Location: [facility];  Service: Endoscopy;  Laterality: N/A;  . ENDOMETRIAL BIOPSY  2013   benign- post menopausal bleeding  . THYROIDECTOMY N/A 10/14/2015   Procedure: THYROIDECTOMY;  Surgeon: Margaretha Sheffield, MD;  Location: [facility];  Service: ENT;  Laterality: N/A;  . TUBAL LIGATION      OB History   No obstetric history on file.      Home Medications    Prior to Admission medications   Medication Sig Start Date End Date Taking? Authorizing Provider  calcium gluconate 650 MG tablet Take 650 mg by mouth daily.   Yes [provider]  Cholecalciferol (VITAMIN D3) 2000 units CHEW Chew by mouth.   Yes [provider]  cyanocobalamin 500 MCG tablet Take 500 mcg by mouth daily.   Yes [provider]  doxycycline (VIBRAMYCIN) 100 MG capsule Take 1 capsule (100 mg total) by mouth 2 (two) times daily. 10/15/18  Yes Lorin Picket, PA-C  ferrous sulfate 325 (65  FE) MG tablet Take 325 mg by mouth daily with breakfast.   Yes [provider]  levothyroxine (SYNTHROID, LEVOTHROID) 125 MCG tablet Take 125 mcg by mouth daily before breakfast.  11/30/15  Yes [provider]  mupirocin ointment (BACTROBAN) 2 % Apply 1 application topically 3 (three) times daily. 10/15/18  Yes Lorin Picket, PA-C    Family History Family History  Problem Relation Age of Onset  . Hypertension Mother   . Lung cancer Mother   . Heart attack Father   . Heart attack Brother     Social History Social History   Tobacco Use  . Smoking status: Former Smoker    Packs/day: 0.50    Years: 18.00    Pack years: 9.00    Types: Cigarettes    Last attempt to quit: 03/21/2005    Years since quitting: 13.5  . Smokeless tobacco: Never Used  Substance Use Topics  . Alcohol use: No    Alcohol/week: 0.0 standard drinks  . Drug use: No     Allergies   Patient has no known allergies.   Review of Systems Review of Systems   Physical Exam Triage Vital Signs ED Triage Vitals  Enc Vitals Group     BP 10/17/18 0900 (!) 153/99     Pulse Rate 10/17/18 0900 65     Resp 10/17/18 0900  18     Temp 10/17/18 0900 98.3 F (36.8 C)     Temp Source 10/17/18 0900 Oral     SpO2 10/17/18 0900 97 %     Weight 10/17/18 0902 240 lb (108.9 kg)     Height 10/17/18 0902 5\' 5"  (1.651 m)     Head Circumference --      Peak Flow --      Pain Score 10/17/18 0902 0     Pain Loc --      Pain Edu? --      Excl. in [place]? --    No data found.  Updated Vital Signs BP (!) 153/99 (BP Location: Right Arm)   Pulse 65   Temp 98.3 F (36.8 C) (Oral)   Resp 18   Ht 5\' 5"  (1.651 m)   Wt 108.9 kg   SpO2 97%   BMI 39.94 kg/m   Visual Acuity Right Eye Distance:   Left Eye Distance:   Bilateral Distance:    Right Eye Near:   Left Eye Near:    Bilateral Near:     Physical Exam Vitals signs and nursing note reviewed.  Constitutional:      General: She is not in acute  distress.    Appearance: She is not toxic-appearing or diaphoretic.  Skin:    Comments: Wound on left upper back with packing in place; purulent drainage present; mild surrounding tenderness  Neurological:     Mental Status: She is alert.      UC Treatments / Results  Labs (all labs ordered are listed, but only abnormal results are displayed) Labs Reviewed - No data to display  EKG None  Radiology No results found.  Procedures Procedures (including critical care time)  Medications Ordered in UC Medications - No data to display  Initial Impression / Assessment and Plan / UC Course  I have reviewed the triage vital signs and the nursing notes.  Pertinent labs & imaging results that were available during my care of the patient were reviewed by me and considered in my medical decision making (see chart for details).      Final Clinical Impressions(s) / UC Diagnoses   Final diagnoses:  Wound check, abscess  (healing; improving)   Discharge Instructions     Follow up in 2-3 days for wound recheck    ED Prescriptions    None     1. diagnosis reviewed with patient 2. Packing removed and wound re-packed with iodoform packing 3. Recommend supportive treatment with continued wound care 4. Continue current oral antibiotic 5. Follow-up in 2-3 days for wound recheck  Controlled Substance Prescriptions [place] Controlled Substance Registry consulted? Not Applicable   Norval Gable, MD 10/17/18 1051

## 2018-10-20 ENCOUNTER — Other Ambulatory Visit: Payer: Self-pay

## 2018-10-20 ENCOUNTER — Ambulatory Visit
Admission: EM | Admit: 2018-10-20 | Discharge: 2018-10-20 | Disposition: A | Discharge status: Home / Self Care | Payer: BLUE CROSS/BLUE SHIELD | Attending: Emergency Medicine | Admitting: Emergency Medicine

## 2018-10-20 ENCOUNTER — Encounter: Payer: Self-pay | Admitting: Emergency Medicine

## 2018-10-20 DIAGNOSIS — Z4800 Encounter for change or removal of nonsurgical wound dressing: Secondary | ICD-10-CM

## 2018-10-20 DIAGNOSIS — Z87891 Personal history of nicotine dependence: Secondary | ICD-10-CM

## 2018-10-20 DIAGNOSIS — Z5189 Encounter for other specified aftercare: Secondary | ICD-10-CM

## 2018-10-20 MED ORDER — DOXYCYCLINE HYCLATE 100 MG PO CAPS: 100.0000 mg | ORAL_CAPSULE | Freq: Two times a day (BID) | ORAL | 0 refills | Status: AC

## 2018-10-20 NOTE — ED Provider Notes (Signed)
[facility] ____________________________________________  Time seen: Approximately 3:17 PM  I have reviewed the triage vital signs and the nursing notes.   HISTORY  Chief Complaint Wound Check   HPI ARTA STUMP is a 58 y.o. female patient presenting today for recheck of abscess to left upper back.  Patient was initially seen in urgent care on 1/25 and had I&D sebaceous cyst performed, then returned on 1/27 for packing removal and repacking.  Patient currently taking doxycycline and reports she is tolerating well.  States that she only has 1 more day of doxycycline left.  States her cousin has been changing the outer dressing.  Denies any other treatments at home.  States that she is feeling much better.  Denies any pain to the area unsure if any drainage has continued.  No accompanying fevers.  Denies chest pain or shortness of breath or other complaints.  Glean Hess, MD: PCP    Past Medical History:  Diagnosis Date  . Arthritis   . Cancer ([place])    Thyroid  . Heart murmur     Patient Active Problem List   Diagnosis Date Noted  . Special screening for malignant neoplasms, colon   . S/P total thyroidectomy 10/14/2015  . Papillary thyroid carcinoma ([place]) 10/01/2015  . Osteoarthritis of knee 09/02/2015  . Hidradenitis axillaris 09/02/2015    Past Surgical History:  Procedure Laterality Date  . COLONOSCOPY WITH PROPOFOL N/A 03/20/2016   Procedure: COLONOSCOPY WITH PROPOFOL;  Surgeon: Lucilla Lame, MD;  Location: [facility];  Service: Endoscopy;  Laterality: N/A;  . ENDOMETRIAL BIOPSY  2013   benign- post menopausal bleeding  . THYROIDECTOMY N/A 10/14/2015   Procedure: THYROIDECTOMY;  Surgeon: Margaretha Sheffield, MD;  Location: [facility];  Service: ENT;  Laterality: N/A;  . TUBAL LIGATION       No current facility-administered medications for this encounter.   Current Outpatient Medications:  .  calcium gluconate 650 MG tablet, Take 650 mg by  mouth daily., Disp: , Rfl:  .  Cholecalciferol (VITAMIN D3) 2000 units CHEW, Chew by mouth., Disp: , Rfl:  .  cyanocobalamin 500 MCG tablet, Take 500 mcg by mouth daily., Disp: , Rfl:  .  doxycycline (VIBRAMYCIN) 100 MG capsule, Take 1 capsule (100 mg total) by mouth 2 (two) times daily., Disp: 14 capsule, Rfl: 0 .  ferrous sulfate 325 (65 FE) MG tablet, Take 325 mg by mouth daily with breakfast., Disp: , Rfl:  .  levothyroxine (SYNTHROID, LEVOTHROID) 125 MCG tablet, Take 125 mcg by mouth daily before breakfast. , Disp: , Rfl:  .  mupirocin ointment (BACTROBAN) 2 %, Apply 1 application topically 3 (three) times daily., Disp: 22 g, Rfl: 0 .  doxycycline (VIBRAMYCIN) 100 MG capsule, Take 1 capsule (100 mg total) by mouth 2 (two) times daily for 3 days., Disp: 6 capsule, Rfl: 0  Allergies Patient has no known allergies.  Family History  Problem Relation Age of Onset  . Hypertension Mother   . Lung cancer Mother   . Heart attack Father   . Heart attack Brother     Social History Social History   Tobacco Use  . Smoking status: Former Smoker    Packs/day: 0.50    Years: 18.00    Pack years: 9.00    Types: Cigarettes    Last attempt to quit: 03/21/2005    Years since quitting: 13.5  . Smokeless tobacco: Never Used  Substance Use Topics  . Alcohol use: No  Alcohol/week: 0.0 standard drinks  . Drug use: No    Review of Systems Constitutional: No fever Cardiovascular: Denies chest pain. Respiratory: Denies shortness of breath. Gastrointestinal: No abdominal pain.  No nausea, no vomiting. Musculoskeletal: Negative for back pain. Skin: Left back abscess   ____________________________________________   PHYSICAL EXAM:  VITAL SIGNS: ED Triage Vitals  Enc Vitals Group     BP 10/20/18 1157 (!) 154/86     Pulse Rate 10/20/18 1157 (!) 55     Resp 10/20/18 1157 16     Temp 10/20/18 1157 (!) 97.5 F (36.4 C)     Temp Source 10/20/18 1157 Oral     SpO2 10/20/18 1157 100 %      Weight 10/20/18 1156 240 lb (108.9 kg)     Height 10/20/18 1156 5\' 5"  (1.651 m)     Head Circumference --      Peak Flow --      Pain Score 10/20/18 1156 0     Pain Loc --      Pain Edu? --      Excl. in [place]? --     Constitutional: Alert and oriented. Well appearing and in no acute distress. ENT      Head: Normocephalic and atraumatic. Cardiovascular: Normal rate, regular rhythm. Grossly normal heart sounds.  Good peripheral circulation. Respiratory: Normal respiratory effort without tachypnea nor retractions. Breath sounds are clear and equal bilaterally. No wheezes, rales, rhonchi. Musculoskeletal: Steady gait.  No midline cervical, thoracic or lumbar tenderness palpation. Neurologic:  Normal speech and language. No gross focal neurologic deficits are appreciated. Speech is normal. No gait instability.  Skin:  Skin is warm, dry.  Except: Left upper back wound present with packing present, active purulent drainage with mild immediate surrounding induration, nontender, minimal surrounding erythema, minimal thin drainage further expressed. Psychiatric: Mood and affect are normal. Speech and behavior are normal. Patient exhibits appropriate insight and judgment   ___________________________________________   LABS (all labs ordered are listed, but only abnormal results are displayed)  Labs Reviewed - No data to display ____________________________________________  PROCEDURES Procedures   Abscess packing Left upper back abscess packing removed, area cleaned and prepped with Betadine, sterile iodoform packing reinserted sterile forceps.  Patient tolerated well.  Declined anesthesia.  Dressing applied by nursing staff.  INITIAL IMPRESSION / ASSESSMENT AND PLAN / ED COURSE  Pertinent labs & imaging results that were available during my care of the patient were reviewed by me and considered in my medical decision making (see chart for details).  Overall well-appearing patient.  No  acute distress.  Left upper back abscess present, packing removed and repacked.  Patient tolerated well.  Suspect this was an infected sebaceous cyst.  As symptoms persist with active purulent drainage, extended doxycycline prescription for 3 more additional days to total 7 days, as well as recommend for patient to have outpatient follow-up with surgery due to the concern that this is continued.  Nursing staff called and obtain appointment for patient to see outpatient surgery Dr. Hampton Abbot this coming Tuesday at 1:30 PM.  Discussed wound care, warm compresses, monitoring and continue antibiotics. Discussed indication, risks and benefits of medications with patient.  Discussed follow up with Primary care physician this week. Discussed follow up and return parameters including no resolution or any worsening concerns. Patient verbalized understanding and agreed to plan.   ____________________________________________   FINAL CLINICAL IMPRESSION(S) / ED DIAGNOSES  Final diagnoses:  Wound check, abscess     ED Discharge Orders  Ordered    doxycycline (VIBRAMYCIN) 100 MG capsule  2 times daily     10/20/18 1428           Note: This dictation was prepared with Dragon dictation along with smaller phrase technology. Any transcriptional errors that result from this process are unintentional.         Marylene Land, NP 10/20/18 1523

## 2018-10-20 NOTE — Discharge Instructions (Signed)
Continue medication as prescribed.  Warm compresses.. Drink plenty of fluids.   Follow-up with surgery this week as discussed.  Follow up with your primary care physician this week as needed. Return to Urgent care for new or worsening concerns.

## 2018-10-20 NOTE — ED Triage Notes (Signed)
Patient in today for recheck of wound from 10/15/17 where she had a cyst I&D.

## 2018-10-20 NOTE — ED Triage Notes (Signed)
Scheduled an appointment for 10/25/18 @ 1:30pm at [facility] with Dr. Rosana Hoes.

## 2018-10-21 LAB — AEROBIC/ANAEROBIC CULTURE (SURGICAL/DEEP WOUND)

## 2018-10-25 ENCOUNTER — Ambulatory Visit: Payer: Self-pay | Admitting: Surgery

## 2018-10-25 ENCOUNTER — Other Ambulatory Visit: Payer: Self-pay

## 2018-10-25 ENCOUNTER — Encounter: Payer: Self-pay | Admitting: Surgery

## 2018-10-25 ENCOUNTER — Ambulatory Visit (INDEPENDENT_AMBULATORY_CARE_PROVIDER_SITE_OTHER): Payer: BLUE CROSS/BLUE SHIELD | Admitting: Surgery

## 2018-10-25 DIAGNOSIS — L089 Local infection of the skin and subcutaneous tissue, unspecified: Secondary | ICD-10-CM

## 2018-10-25 DIAGNOSIS — E890 Postprocedural hypothyroidism: Secondary | ICD-10-CM | POA: Diagnosis not present

## 2018-10-25 DIAGNOSIS — L723 Sebaceous cyst: Secondary | ICD-10-CM | POA: Diagnosis not present

## 2018-10-25 NOTE — Patient Instructions (Signed)
Pick up your medication at the pharmacy and begin taking it today. You may remove packing material from the wound, shower as usual and place new packing into wound. Cover the area with a dry dressing.  You will need to pack the wound once daily.    Please see your follow up appointment listed below.

## 2018-10-26 ENCOUNTER — Telehealth: Payer: Self-pay | Admitting: Surgery

## 2018-10-26 ENCOUNTER — Encounter: Payer: Self-pay | Admitting: Surgery

## 2018-10-26 DIAGNOSIS — L089 Local infection of the skin and subcutaneous tissue, unspecified: Secondary | ICD-10-CM | POA: Insufficient documentation

## 2018-10-26 DIAGNOSIS — L723 Sebaceous cyst: Principal | ICD-10-CM

## 2018-10-26 HISTORY — DX: Local infection of the skin and subcutaneous tissue, unspecified: L08.9

## 2018-10-26 NOTE — Progress Notes (Signed)
10/26/2018  Reason for Visit:  Infected sebaceous cyst  Referring Provider:  Marylene Land, NP  History of Present Illness: Leah Rogers is a 58 y.o. female presenting for evaluation of infected sebaceous cyst.  She has had a cyst on her back for many years without any issues, but two weeks ago it got infected.  She presented to Urgent Care on 1/25 and had an I&D procedure with packing of the wound with 1/4 inch gauze.  She was given course of Doxycycline.  She presented again on 1/30 and was having persistent purulent drainage, so was referred to me for evaluation.  The packing has only been changed twice since th procedure, once on 1/27 and once on 1/30.  She has only been doing external gauze dressing change.  Denies any fevers, chills, chest pain, shortness of breath.  Reports the area is still swollen though better than before.  Reports it is also tender though better than before the I&D.  Past Medical History: Past Medical History:  Diagnosis Date  . Arthritis   . Cancer ([place])    Thyroid  . Heart murmur      Past Surgical History: Past Surgical History:  Procedure Laterality Date  . COLONOSCOPY WITH PROPOFOL N/A 03/20/2016   Procedure: COLONOSCOPY WITH PROPOFOL;  Surgeon: Lucilla Lame, MD;  Location: [facility];  Service: Endoscopy;  Laterality: N/A;  . ENDOMETRIAL BIOPSY  2013   benign- post menopausal bleeding  . THYROIDECTOMY N/A 10/14/2015   Procedure: THYROIDECTOMY;  Surgeon: Margaretha Sheffield, MD;  Location: [facility];  Service: ENT;  Laterality: N/A;  . TUBAL LIGATION      Home Medications: Prior to Admission medications   Medication Sig Start Date End Date Taking? Authorizing Provider  calcium gluconate 650 MG tablet Take 650 mg by mouth daily.   Yes [provider]  Cholecalciferol (VITAMIN D3) 1.25 MG (50000 UT) CAPS Vitamin D3   Yes [provider]  Cholecalciferol (VITAMIN D3) 2000 units CHEW Chew by mouth.   Yes [provider]   cyanocobalamin 500 MCG tablet Take 500 mcg by mouth daily.   Yes [provider]  ferrous sulfate 325 (65 FE) MG tablet Take 325 mg by mouth daily with breakfast.   Yes [provider]  levothyroxine (SYNTHROID, LEVOTHROID) 125 MCG tablet Take 125 mcg by mouth daily before breakfast.  11/30/15  Yes [provider]  mupirocin ointment (BACTROBAN) 2 % Apply 1 application topically 3 (three) times daily. 10/15/18  Yes Lorin Picket, PA-C    Allergies: No Known Allergies  Social History:  reports that she quit smoking about 13 years ago. Her smoking use included cigarettes. She has a 9.00 pack-year smoking history. She has never used smokeless tobacco. She reports that she does not drink alcohol or use drugs.   Family History: Family History  Problem Relation Age of Onset  . Hypertension Mother   . Lung cancer Mother   . Heart attack Father   . Heart attack Brother     Review of Systems: Review of Systems  Constitutional: Negative for chills and fever.  Respiratory: Negative for shortness of breath.   Cardiovascular: Negative for chest pain.  Gastrointestinal: Negative for abdominal pain, nausea and vomiting.  Genitourinary: Negative for dysuria.  Musculoskeletal: Negative for myalgias.  Skin:       Draining cyst on her upper back, with swelling.  Neurological: Negative for dizziness.  Psychiatric/Behavioral: Negative for depression.    Physical Exam BP (!) 146/97  Pulse 72   Temp (!) 96.2 F (35.7 C) (Oral)   Resp 16   Ht 5\' 5"  (1.651 m)   Wt 252 lb (114.3 kg)   SpO2 96%   BMI 41.93 kg/m  CONSTITUTIONAL: No acute distress HEENT:  Normocephalic, atraumatic, extraocular motion intact. NECK: Trachea is midline, and there is no jugular venous distension.  RESPIRATORY:  Lungs are clear, and breath sounds are equal bilaterally. Normal respiratory effort without pathologic use of accessory muscles. CARDIOVASCULAR: Heart is regular without  murmurs, gallops, or rubs. GI: The abdomen is soft, non-distended, non-tender.  MUSCULOSKELETAL:  Normal muscle strength and tone in all four extremities.  No peripheral edema or cyanosis. SKIN: On the left upper back, there is a sebaceous cyst about 3 cm length, with surrounding inflammation about 1 cm width, with a 5 mm incision with 1/4 gauze packing.  This was removed.  Unable to probe with qtip, so had to use back of qtip to assess the size of cyst.  Some seropurulent fluid drained. NEUROLOGIC:  Motor and sensation is grossly normal.  Cranial nerves are grossly intact. PSYCH:  Alert and oriented to person, place and time. Affect is normal.  Laboratory Analysis: No results found for this or any previous visit (from the past 24 hour(s)).  Imaging: No results found.  Assessment and Plan: This is a 58 y.o. female with an infected sebaceous cyst of the upper back.  Discussed with the patient that my concern is that the incision is too small to allow good drainage of the cyst, and this could be why it's not improving.  This could also be related to the antibiotic not being as effective.  Recommended that we do a repeat I&D to extend the incision and allow for better drainage, and then give a new course of antibiotic to help with the infection.  Discussed the risks of bleeding, infection, and injury to surrounding structures, and she's willing to proceed.   Procedure Date:  10/26/2018  Pre-operative Diagnosis:  Infected sebaceous cyst of left upper back  Post-operative Diagnosis:  Infected sebaceous cyst of left upper back  Procedure:  Incision and Drainage of infected sebaceous cyst of left upper back  Surgeon:  Melvyn Neth, MD  Anesthesia:  5 ml 1% lidocaine with epi  Estimated Blood Loss:  2 ml  Specimens:  None  Complications:  None  Indications for Procedure:  This is a 58 y.o. female with diagnosis of infected sebaceous cyst, requiring repeat drainage procedure.  The  risks of bleeding, abscess or infection, injury to surrounding structures, and need for further procedures were all discussed with the patient and was willing to proceed.  Description of Procedure: The patient was correctly identified at bedside.  Appropriate time-outs were performed prior to procedure.  The patient's left upper back was prepped and draped in usual sterile fashion.  Local anesthetic was infused intradermally.  The previous incision was extended superiorly for a total wound length of 2.5 cm.  Small Kelly forceps were used to dissect around the abscess tissue to open any remaining pockets of purulent fluid.  After drainage was completed, the cavity was irrigated and cleaned.  The wound was packed with 1/2 inch gauze and covered with dry gauze and tape.  The patient tolerated the procedure well and all sharps were appropriately disposed of at the end of the case.   --Patient will get a new prescription for Bactrim DS for 1 week course. --Follow up in one week --Instructed to  change packing on 2/6 and then daily.   Melvyn Neth, [facility]

## 2018-10-26 NOTE — Telephone Encounter (Signed)
Patient said she was seen yesterday and was suppose to receive an antibiotic, said it was suppose to be sent to [facility], but they never received it, the patient stated she uses  [facility] in [place]. Please call patient and advise.

## 2018-10-27 ENCOUNTER — Other Ambulatory Visit: Payer: Self-pay

## 2018-10-27 MED ORDER — SULFAMETHOXAZOLE-TRIMETHOPRIM 800-160 MG PO TABS: 1.0000 | ORAL_TABLET | Freq: Two times a day (BID) | ORAL | 0 refills | Status: DC

## 2018-10-27 NOTE — Telephone Encounter (Signed)
Tried reaching out to patient, unable to leave message, mailbox full. I have resent the prescription to [facility].

## 2018-10-31 NOTE — Telephone Encounter (Signed)
Patient instructed to pick up medicine at pharmacy.

## 2018-11-02 ENCOUNTER — Encounter: Payer: Self-pay | Admitting: Surgery

## 2018-11-02 ENCOUNTER — Ambulatory Visit (INDEPENDENT_AMBULATORY_CARE_PROVIDER_SITE_OTHER): Payer: BLUE CROSS/BLUE SHIELD | Admitting: Surgery

## 2018-11-02 ENCOUNTER — Other Ambulatory Visit: Payer: Self-pay

## 2018-11-02 VITALS — BP 135/92 | HR 79 | Temp 97.5°F | Resp 16 | Ht 65.0 in | Wt 251.8 lb

## 2018-11-02 DIAGNOSIS — L089 Local infection of the skin and subcutaneous tissue, unspecified: Secondary | ICD-10-CM

## 2018-11-02 DIAGNOSIS — Z09 Encounter for follow-up examination after completed treatment for conditions other than malignant neoplasm: Secondary | ICD-10-CM

## 2018-11-02 DIAGNOSIS — L723 Sebaceous cyst: Secondary | ICD-10-CM

## 2018-11-02 NOTE — Patient Instructions (Addendum)
We will see you back in 1 month.    Excision of Skin Cysts or Lesions Excision of a skin lesion refers to the removal of a section of skin by making small cuts (incisions) in the skin. This procedure may be done to remove a cancerous (malignant) or noncancerous (benign) growth on the skin. It is typically done to treat or prevent cancer or infection. It may also be done to improve cosmetic appearance. The procedure may be done to remove:  Cancerous growths, such as basal cell carcinoma, squamous cell carcinoma, or melanoma.  Noncancerous growths, such as a cyst or lipoma.  Growths, such as moles or skin tags, which may be removed for cosmetic reasons.  Various excision or surgical techniques may be used depending on your condition, the location of the lesion, and your overall health. Tell a health care provider about:  Any allergies you have.  All medicines you are taking, including vitamins, herbs, eye drops, creams, and over-the-counter medicines.  Any problems you or family members have had with anesthetic medicines.  Any blood disorders you have.  Any surgeries you have had.  Any medical conditions you have.  Whether you are pregnant or may be pregnant. What are the risks? Generally, this is a safe procedure. However, problems may occur, including:  Bleeding.  Infection.  Scarring.  Recurrence of the cyst, lipoma, or cancer.  Changes in skin sensation or appearance, such as discoloration or swelling.  Reaction to the anesthetics.  Allergic reaction to surgical materials or ointments.  Damage to nerves, blood vessels, muscles, or other structures.  Continued pain.  What happens before the procedure?  Ask your health care provider about: ? Changing or stopping your regular medicines. This is especially important if you are taking diabetes medicines or blood thinners. ? Taking medicines such as aspirin and ibuprofen. These medicines can thin your blood. Do not  take these medicines before your procedure if your health care provider instructs you not to.  You may be asked to take certain medicines.  You may be asked to stop smoking.  You may have an exam or testing.  Plan to have someone take you home after the procedure.  Plan to have someone help you with activities during recovery. What happens during the procedure?  To reduce your risk of infection: ? Your health care team will wash or sanitize their hands. ? Your skin will be washed with soap.  You will be given a medicine to numb the area (local anesthetic).  One of the following excision techniques will be performed.  At the end of any of these procedures, antibiotic ointment will be applied as needed. Each of the following techniques may vary among health care providers and hospitals. Complete Surgical Excision The area of skin that needs to be removed will be marked with a pen. Using a small scalpel or scissors, the surgeon will gently cut around and under the lesion until it is completely removed. The lesion will be placed in a fluid and sent to the lab for examination. If necessary, bleeding will be controlled with a device that delivers heat (electrocautery). The edges of the wound may be stitched (sutured) together, and a bandage (dressing) will be applied. This procedure may be performed to treat a cancerous growth or a noncancerous cyst or lesion. Excision of a Cyst The surgeon will make an incision on the cyst. The entire cyst will be removed through the incision. The incision may be closed with sutures. Shave Excision  During shave excision, the surgeon will use a small blade or an electrically heated loop instrument to shave off the lesion. This may be done to remove a mole or a skin tag. The wound will usually be left to heal on its own without sutures. Punch Excision During punch excision, the surgeon will use a small tool that is like a cookie cutter or a hole punch to cut a  circle shape out of the skin. The outer edges of the skin will be sutured together. This may be done to remove a mole or a scar or to perform a biopsy of the lesion. Mohs Micrographic Surgery During Mohs micrographic surgery, layers of the lesion will be removed with a scalpel or a loop instrument and will be examined right away under a microscope. Layers will be removed until all of the abnormal or cancerous tissue has been removed. This procedure is minimally invasive, and it ensures the best cosmetic outcome. It involves the removal of as little normal tissue as possible. Mohs is usually done to treat skin cancer, such as basal cell carcinoma or squamous cell carcinoma, particularly on the face and ears. Depending on the size of the surgical wound, it may be sutured closed. What happens after the procedure?  Return to your normal activities as told by your health care provider.  Talk with your health care provider to discuss any test results, treatment options, and if necessary, the need for more tests. This information is not intended to replace advice given to you by your health care provider. Make sure you discuss any questions you have with your health care provider. Document Released: 12/02/2009 Document Revised: 02/13/2016 Document Reviewed: 10/24/2014 Elsevier Interactive Patient Education  [employer].

## 2018-11-02 NOTE — Progress Notes (Signed)
11/02/2018  HPI: Leah Rogers is a 58 y.o. female s/p I&D of infected sebaceous cyst on 10/25/18.  She presents for follow up.  Denies having any pain on her back where the cyst is, and her wound is healing well.  Continues doing packing dressing changes.  Vital signs: BP (!) 135/92   Pulse 79   Temp (!) 97.5 F (36.4 C) (Temporal)   Resp 16   Ht 5\' 5"  (1.651 m)   Wt 251 lb 12.8 oz (114.2 kg)   SpO2 97%   BMI 41.90 kg/m    Physical Exam: Constitutional: No acute distress Skin:  Left back I&D site is clean, dry, without any purulence.  There is no erythema or induration.  Wound is about 5 mm deep by 5 mm length.  Assessment/Plan: This is a 57 y.o. female s/p I&D of infected sebaceous cyst.  --Discussed with patient that her wound is superficial enough that she does not need any further packing.  She can place gauze dressing daily or bandaid. --We will schedule the patient for cyst excision next month so this wont get reinfected.  She will come back 3/20 for excision.   Melvyn Neth, [facility]

## 2018-11-04 DIAGNOSIS — Z85850 Personal history of malignant neoplasm of thyroid: Secondary | ICD-10-CM | POA: Diagnosis not present

## 2018-11-04 DIAGNOSIS — E890 Postprocedural hypothyroidism: Secondary | ICD-10-CM | POA: Diagnosis not present

## 2018-11-25 DIAGNOSIS — E890 Postprocedural hypothyroidism: Secondary | ICD-10-CM | POA: Diagnosis not present

## 2018-12-09 ENCOUNTER — Ambulatory Visit (INDEPENDENT_AMBULATORY_CARE_PROVIDER_SITE_OTHER): Payer: BLUE CROSS/BLUE SHIELD | Admitting: Surgery

## 2018-12-09 ENCOUNTER — Encounter: Payer: Self-pay | Admitting: Surgery

## 2018-12-09 ENCOUNTER — Other Ambulatory Visit: Payer: Self-pay

## 2018-12-09 VITALS — BP 128/82 | HR 82 | Temp 98.1°F | Resp 16 | Ht 65.0 in | Wt 250.0 lb

## 2018-12-09 DIAGNOSIS — L720 Epidermal cyst: Secondary | ICD-10-CM | POA: Diagnosis not present

## 2018-12-09 DIAGNOSIS — L089 Local infection of the skin and subcutaneous tissue, unspecified: Secondary | ICD-10-CM

## 2018-12-09 DIAGNOSIS — L723 Sebaceous cyst: Secondary | ICD-10-CM

## 2018-12-09 MED ORDER — OXYCODONE HCL 5 MG PO TABS: 5.0000 mg | ORAL_TABLET | Freq: Three times a day (TID) | ORAL | 0 refills | Status: DC | PRN

## 2018-12-09 NOTE — Progress Notes (Signed)
  Procedure Date:  12/09/2018  Pre-operative Diagnosis:  Left back sebaceous cyst  Post-operative Diagnosis:  Left back sebaceous cyst  Procedure:  Excision of left back sebaceous cyst  Surgeon:  Melvyn Neth, MD  Anesthesia:  General endotracheal  Estimated Blood Loss:  5 ml  Specimens:  Sebaceous cyst  Complications:  None  Indications for Procedure:  This is a 58 y.o. female with diagnosis of a symptomatic left back sebaceous cyst, which had been recently infected, s/p I&D.  The patient wishes to have this excised. The risks of bleeding, abscess or infection, injury to surrounding structures, and need for further procedures were all discussed with the patient and she was willing to proceed.  Description of Procedure: The patient was correctly identified at bedside.  The patient was placed prone.  Appropriate time-outs were performed.    The patient's left upper back was prepped and draped in usual sterile fashion.  Local anesthetic was infused intradermally.  A diagonal 4 cm elliptical incision was made over the cyst, including the area of previous I&D.  Sharp dissection was used to core out the sebaceous cyst, intact.  Skin flaps were created sharply.  The wound was then closed in two layers using 3-0 Vicryl and 3-0 Nylon for the skin.  The incision was cleaned and dressed with Telfa gauze and TegaDerm.  The patient tolerated the procedure well and all sharps were appropriately disposed of at the end of the case.  --Patient may remove dressing in 48 hours and may shower then. --Daily dry gauze dressing change. --Follow up in 10 days for suture removal.   Melvyn Neth, MD

## 2018-12-09 NOTE — Patient Instructions (Addendum)
Please see your appointment listed below.   We have removed a Cyst in our office today.  You have sutures under the skin that will dissolve and also dermabond (skin glue) on top of your skin which will come off on it's own in 10-14 days.  You may shower in 48 hours, this is on 12/11/2018  Avoid Strenuous activities that will make you sweat during the next 48 hours to avoid the glue coming off prematurely. Avoid activities that will place pressure to this area of the body for 1-2 weeks to avoid re-injury to incision site.  Please see your follow-up appointment provided. We will see you back in office to make sure this area is healed and to review the final pathology. If you have any questions or concerns prior to this appointment, call our office and speak with a nurse.    Excision of Skin Cysts or Lesions Excision of a skin lesion refers to the removal of a section of skin by making small cuts (incisions) in the skin. This procedure may be done to remove a cancerous (malignant) or noncancerous (benign) growth on the skin. It is typically done to treat or prevent cancer or infection. It may also be done to improve cosmetic appearance. The procedure may be done to remove:  Cancerous growths, such as basal cell carcinoma, squamous cell carcinoma, or melanoma.  Noncancerous growths, such as a cyst or lipoma.  Growths, such as moles or skin tags, which may be removed for cosmetic reasons.  Various excision or surgical techniques may be used depending on your condition, the location of the lesion, and your overall health. Tell a health care provider about:  Any allergies you have.  All medicines you are taking, including vitamins, herbs, eye drops, creams, and over-the-counter medicines.  Any problems you or family members have had with anesthetic medicines.  Any blood disorders you have.  Any surgeries you have had.  Any medical conditions you have.  Whether you are pregnant or may  be pregnant. What are the risks? Generally, this is a safe procedure. However, problems may occur, including:  Bleeding.  Infection.  Scarring.  Recurrence of the cyst, lipoma, or cancer.  Changes in skin sensation or appearance, such as discoloration or swelling.  Reaction to the anesthetics.  Allergic reaction to surgical materials or ointments.  Damage to nerves, blood vessels, muscles, or other structures.  Continued pain.  What happens before the procedure?  Ask your health care provider about: ? Changing or stopping your regular medicines. This is especially important if you are taking diabetes medicines or blood thinners. ? Taking medicines such as aspirin and ibuprofen. These medicines can thin your blood. Do not take these medicines before your procedure if your health care provider instructs you not to.  You may be asked to take certain medicines.  You may be asked to stop smoking.  You may have an exam or testing.  Plan to have someone take you home after the procedure.  Plan to have someone help you with activities during recovery. What happens during the procedure?  To reduce your risk of infection: ? Your health care team will wash or sanitize their hands. ? Your skin will be washed with soap.  You will be given a medicine to numb the area (local anesthetic).  One of the following excision techniques will be performed.  At the end of any of these procedures, antibiotic ointment will be applied as needed. Each of the following techniques may  vary among health care providers and hospitals. Complete Surgical Excision The area of skin that needs to be removed will be marked with a pen. Using a small scalpel or scissors, the surgeon will gently cut around and under the lesion until it is completely removed. The lesion will be placed in a fluid and sent to the lab for examination. If necessary, bleeding will be controlled with a device that delivers heat  (electrocautery). The edges of the wound may be stitched (sutured) together, and a bandage (dressing) will be applied. This procedure may be performed to treat a cancerous growth or a noncancerous cyst or lesion. Excision of a Cyst The surgeon will make an incision on the cyst. The entire cyst will be removed through the incision. The incision may be closed with sutures. Shave Excision During shave excision, the surgeon will use a small blade or an electrically heated loop instrument to shave off the lesion. This may be done to remove a mole or a skin tag. The wound will usually be left to heal on its own without sutures. Punch Excision During punch excision, the surgeon will use a small tool that is like a cookie cutter or a hole punch to cut a circle shape out of the skin. The outer edges of the skin will be sutured together. This may be done to remove a mole or a scar or to perform a biopsy of the lesion. Mohs Micrographic Surgery During Mohs micrographic surgery, layers of the lesion will be removed with a scalpel or a loop instrument and will be examined right away under a microscope. Layers will be removed until all of the abnormal or cancerous tissue has been removed. This procedure is minimally invasive, and it ensures the best cosmetic outcome. It involves the removal of as little normal tissue as possible. Mohs is usually done to treat skin cancer, such as basal cell carcinoma or squamous cell carcinoma, particularly on the face and ears. Depending on the size of the surgical wound, it may be sutured closed. What happens after the procedure?  Return to your normal activities as told by your health care provider.  Talk with your health care provider to discuss any test results, treatment options, and if necessary, the need for more tests. This information is not intended to replace advice given to you by your health care provider. Make sure you discuss any questions you have with your health  care provider. Document Released: 12/02/2009 Document Revised: 02/13/2016 Document Reviewed: 10/24/2014 Elsevier Interactive Patient Education  [employer].

## 2018-12-16 ENCOUNTER — Other Ambulatory Visit: Payer: Self-pay

## 2018-12-16 ENCOUNTER — Inpatient Hospital Stay: Payer: BLUE CROSS/BLUE SHIELD | Attending: Hematology and Oncology

## 2018-12-16 DIAGNOSIS — C73 Malignant neoplasm of thyroid gland: Secondary | ICD-10-CM | POA: Diagnosis not present

## 2018-12-17 LAB — THYROID PANEL WITH TSH
Free Thyroxine Index: 1.7 (ref 1.2–4.9)
T3 Uptake Ratio: 23 % — ABNORMAL LOW (ref 24–39)
T4, Total: 7.4 ug/dL (ref 4.5–12.0)
TSH: 14.46 u[IU]/mL — ABNORMAL HIGH (ref 0.450–4.500)

## 2018-12-19 LAB — THYROGLOBULIN ANTIBODY: Thyroglobulin Antibody: <1 IU/mL (ref 0.0–0.9)

## 2018-12-21 ENCOUNTER — Ambulatory Visit (INDEPENDENT_AMBULATORY_CARE_PROVIDER_SITE_OTHER): Payer: BLUE CROSS/BLUE SHIELD | Admitting: Surgery

## 2018-12-21 ENCOUNTER — Encounter: Payer: Self-pay | Admitting: Surgery

## 2018-12-21 ENCOUNTER — Other Ambulatory Visit: Payer: Self-pay

## 2018-12-21 VITALS — BP 145/104 | HR 88 | Temp 97.3°F | Resp 16 | Ht 65.0 in | Wt 249.6 lb

## 2018-12-21 DIAGNOSIS — L089 Local infection of the skin and subcutaneous tissue, unspecified: Secondary | ICD-10-CM

## 2018-12-21 DIAGNOSIS — L723 Sebaceous cyst: Secondary | ICD-10-CM | POA: Diagnosis not present

## 2018-12-21 DIAGNOSIS — Z09 Encounter for follow-up examination after completed treatment for conditions other than malignant neoplasm: Secondary | ICD-10-CM | POA: Diagnosis not present

## 2018-12-21 NOTE — Patient Instructions (Addendum)
Patient will need to return to the office as needed. It is okay to use vitamin e for the surgical area to help with healing.   Call the office with any questions or concerns.

## 2018-12-21 NOTE — Progress Notes (Signed)
12/21/2018  HPI: Leah Rogers is a 58 y.o. female s/p excision of previously infected sebaceous cyst of the left upper back on 12/09/2018.  She presents today for follow-up.  She reports that she has been doing very well with no drainage from the wound.  She only reports that the incision is itching.  Vital signs: BP (!) 145/104   Pulse 88   Temp (!) 97.3 F (36.3 C) (Temporal)   Resp 16   Ht 5\' 5"  (1.651 m)   Wt 249 lb 9.6 oz (113.2 kg)   SpO2 98%   BMI 41.54 kg/m    Physical Exam: Constitutional: No acute distress Skin: Left upper back incision is healing very well and is almost starting to heal over the sutures.  All sutures were removed with no complications and the wound remains intact.  Dry gauze dressing applied.  There is minimal soreness to palpation and there is no evidence of wound infection or breakdown.  Assessment/Plan: This is a 58 y.o. female s/p excision of previously infected sebaceous cyst of the left upper back.  - Pathology reviewed with the patient.  this was a benign sebaceous cyst.  There was no evidence of malignancy or dysplasia. - Patient may apply lotion over to the wound to moisturize and prevent any itching. - Patient may follow-up as needed   Melvyn Neth, [facility]

## 2018-12-23 ENCOUNTER — Ambulatory Visit: Payer: BLUE CROSS/BLUE SHIELD | Admitting: Oncology

## 2018-12-23 ENCOUNTER — Ambulatory Visit: Payer: BLUE CROSS/BLUE SHIELD | Admitting: Hematology and Oncology

## 2019-01-19 ENCOUNTER — Ambulatory Visit: Payer: BLUE CROSS/BLUE SHIELD | Admitting: Hematology and Oncology

## 2019-02-16 ENCOUNTER — Ambulatory Visit: Payer: BLUE CROSS/BLUE SHIELD | Admitting: Hematology and Oncology

## 2019-03-08 ENCOUNTER — Other Ambulatory Visit: Payer: Self-pay

## 2019-03-09 ENCOUNTER — Other Ambulatory Visit: Payer: Self-pay | Admitting: Hematology and Oncology

## 2019-03-09 ENCOUNTER — Inpatient Hospital Stay: Payer: BC Managed Care – PPO | Attending: Hematology and Oncology

## 2019-03-09 ENCOUNTER — Telehealth: Payer: Self-pay

## 2019-03-09 DIAGNOSIS — C73 Malignant neoplasm of thyroid gland: Secondary | ICD-10-CM

## 2019-03-09 DIAGNOSIS — C173 Meckel's diverticulum, malignant: Secondary | ICD-10-CM | POA: Insufficient documentation

## 2019-03-09 DIAGNOSIS — Z801 Family history of malignant neoplasm of trachea, bronchus and lung: Secondary | ICD-10-CM | POA: Insufficient documentation

## 2019-03-09 DIAGNOSIS — Z79899 Other long term (current) drug therapy: Secondary | ICD-10-CM | POA: Insufficient documentation

## 2019-03-09 DIAGNOSIS — M1990 Unspecified osteoarthritis, unspecified site: Secondary | ICD-10-CM | POA: Diagnosis not present

## 2019-03-09 DIAGNOSIS — Z87891 Personal history of nicotine dependence: Secondary | ICD-10-CM | POA: Insufficient documentation

## 2019-03-09 LAB — CBC WITH DIFFERENTIAL/PLATELET
Abs Immature Granulocytes: 0.02 10*3/uL (ref 0.00–0.07)
Basophils Absolute: 0 10*3/uL (ref 0.0–0.1)
Basophils Relative: 0 %
Eosinophils Absolute: 0.1 10*3/uL (ref 0.0–0.5)
Eosinophils Relative: 1 %
HCT: 37.7 % (ref 36.0–46.0)
Hemoglobin: 12.8 g/dL (ref 12.0–15.0)
Immature Granulocytes: 0 %
Lymphocytes Relative: 31 %
Lymphs Abs: 1.5 10*3/uL (ref 0.7–4.0)
MCH: 31.8 pg (ref 26.0–34.0)
MCHC: 34 g/dL (ref 30.0–36.0)
MCV: 93.5 fL (ref 80.0–100.0)
Monocytes Absolute: 0.7 10*3/uL (ref 0.1–1.0)
Monocytes Relative: 15 %
Neutro Abs: 2.5 10*3/uL (ref 1.7–7.7)
Neutrophils Relative %: 53 %
Platelets: 171 10*3/uL (ref 150–400)
RBC: 4.03 MIL/uL (ref 3.87–5.11)
RDW: 14.6 % (ref 11.5–15.5)
WBC: 4.8 10*3/uL (ref 4.0–10.5)
nRBC: 0 % (ref 0.0–0.2)

## 2019-03-09 LAB — COMPREHENSIVE METABOLIC PANEL
ALT: 24 U/L (ref 0–44)
AST: 24 U/L (ref 15–41)
Albumin: 3.7 g/dL (ref 3.5–5.0)
Alkaline Phosphatase: 60 U/L (ref 38–126)
Anion gap: 7 (ref 5–15)
BUN: 20 mg/dL (ref 6–20)
CO2: 26 mmol/L (ref 22–32)
Calcium: 8.3 mg/dL — ABNORMAL LOW (ref 8.9–10.3)
Chloride: 102 mmol/L (ref 98–111)
Creatinine, Ser: 0.63 mg/dL (ref 0.44–1.00)
GFR calc Af Amer: >60 mL/min (ref >60)
GFR calc non Af Amer: >60 mL/min (ref >60)
Glucose, Bld: 93 mg/dL (ref 70–99)
Potassium: 3.3 mmol/L — ABNORMAL LOW (ref 3.5–5.1)
Sodium: 135 mmol/L (ref 135–145)
Total Bilirubin: 0.4 mg/dL (ref 0.3–1.2)
Total Protein: 7.5 g/dL (ref 6.5–8.1)

## 2019-03-09 LAB — TSH: TSH: 1.125 u[IU]/mL (ref 0.350–4.500)

## 2019-03-09 LAB — T4, FREE: Free T4: 0.82 ng/dL (ref 0.61–1.12)

## 2019-03-09 NOTE — Telephone Encounter (Signed)
-----   Message from Lequita Asal, MD sent at 03/09/2019 11:42 AM EDT ----- Regarding: Please call patient  Calcium and potassium are a little low.  M  ----- Message ----- From: Buel Ream, [facility] Sent: 03/09/2019   9:01 AM EDT To: Lequita Asal, MD

## 2019-03-09 NOTE — Telephone Encounter (Signed)
Spoke with the patient to infrom her that her potassium and calcium was a little low . The patient was understanding and agreeable.

## 2019-03-10 LAB — THYROGLOBULIN ANTIBODY: Thyroglobulin Antibody: <1 IU/mL (ref 0.0–0.9)

## 2019-03-14 ENCOUNTER — Other Ambulatory Visit: Payer: Self-pay

## 2019-03-14 LAB — THYROGLOBULIN LEVEL: Thyroglobulin: <2 ng/mL

## 2019-03-14 NOTE — Progress Notes (Signed)
[facility]  [address] [place], [place] [address] Phone: [phone]  Fax: [phone]   Telemedicine Office Visit:  03/15/2019  Referring physician: Glean Hess, MD  I connected with Leah Rogers on 03/15/2019 at 3:18 PM by videoconferencing and verified that I was speaking with the correct person using 2 identifiers.  The patient was at home.  I discussed the limitations, risk, security and privacy concerns of performing an evaluation and management service by videoconferencing and the availability of in person appointments.  I also discussed with the patient that there may be a patient responsible charge related to this service.  The patient expressed understanding and agreed to proceed.   Chief Complaint: Leah Rogers is a 58 y.o. female with stage II papillary thyroid carcinoma who is seen for a 1 year assessment.  HPI:   The patient was diagnosed with thyroid cancer in 2017.  She presented with symptoms of difficult swallowing.  Thyroid ultrasound on 09/09/2015 revealed dominant nodules bilaterally measuring 3.7 cm on the right and 2 cm on the left.   FNA of the left thyroid nodule on 09/25/2015 was positive for malignancy (Bethesda category VI), papillary thyroid carcinoma.  She underwent total thyroidectomy on 10/14/2015 by Dr Kathyrn Sheriff.  Pathology revealed papillary thyroid carcinoma, focally cystic.  There was a 3.5 cm follicular adenomatoid nodule in the right lobe.  There was a 0.3 cm calcified follicular nodule in the superior left lobe.  Pathologic stage was pT2Nx.  She was initially seen by Dr Grayland Ormond on 11/03/2015.  PET scan on 11/07/2015 revealed postsurgical changes identified within the thyroid bed.  There were no specific findings identified to suggest FDG avid thyroid cancer metastases.  She received RAI (70.2 mCi I-131) with Thyrogen stimulation on 11/18/2015.  Whole body I-131 scan on 11/28/2015 revealed I-131 uptake  with thyroid remnant.  Otherwise negative whole-body I-131 scan without evidence of iodine-avid metastatic disease.  The patient was last seen in the medical oncology clinic on 12/24/2017 by Dr. Grayland Ormond. At that time, she continued to feel well and was asymptomatic. She continued Synthroid 125 mcg/day.   She went to the ED for a cyst on her left upper back she had for years. It was larger, red, and draining.  She underwent incision and drainage on 10/15/2018.  She saw Dr. Hampton Abbot for an infected sebaceous cyst on 10/25/2018.  She underwent incision and drainage on 10/26/2018. She was prescribed Bactrim.  She underwent excision of the left back sebaceous cyst on 12/09/2018.   Last screening mammogram on 01/22/2016 revealed no suspicious findings for malignancy.  Labs followed on 03/09/2019 included a TSH 1.125 (normal), free T4 0.82, thyroglobulin < 2.0, and thyroglobulin antibody < 1.0.  During the interim, She has felt "good." She has pain in her knees. She denies any other symptoms. She denies any health concerns. She reports taking two calcium pills, and 125 mg Synthroid daily. She is interested in having a mammogram.    Past Medical History:  Diagnosis Date  . Arthritis   . Cancer ([place])    Thyroid  . Heart murmur     Past Surgical History:  Procedure Laterality Date  . COLONOSCOPY WITH PROPOFOL N/A 03/20/2016   Procedure: COLONOSCOPY WITH PROPOFOL;  Surgeon: Lucilla Lame, MD;  Location: [facility];  Service: Endoscopy;  Laterality: N/A;  . ENDOMETRIAL BIOPSY  2013   benign- post menopausal bleeding  . THYROIDECTOMY N/A 10/14/2015   Procedure: THYROIDECTOMY;  Surgeon: Margaretha Sheffield,  MD;  Location: [facility];  Service: ENT;  Laterality: N/A;  . TUBAL LIGATION      Family History  Problem Relation Age of Onset  . Hypertension Mother   . Lung cancer Mother   . Heart attack Father   . Heart attack Brother     Social History:  reports that she quit smoking about 13 years  ago. Her smoking use included cigarettes. She has a 9.00 pack-year smoking history. She has never used smokeless tobacco. She reports that she does not drink alcohol or use drugs.  She lives in [place]. She works at [employer] in [place], [place].  The patient is alone today.   Participants in the patient's visit and their role in the encounter included the patient, and Vito Berger, CMA, today.  The intake visit was provided by Vito Berger, CMA.   Allergies: No Known Allergies  Current Medications: Current Outpatient Medications  Medication Sig Dispense Refill  . calcium gluconate 650 MG tablet Take 650 mg by mouth daily.    . Cholecalciferol (VITAMIN D3) 1.25 MG (50000 UT) CAPS Vitamin D3    . cyanocobalamin 500 MCG tablet Take 500 mcg by mouth daily.    . ferrous sulfate 325 (65 FE) MG tablet Take 325 mg by mouth daily with breakfast.    . levothyroxine (SYNTHROID, LEVOTHROID) 125 MCG tablet Take 125 mcg by mouth daily before breakfast.     . mupirocin ointment (BACTROBAN) 2 % Apply 1 application topically 3 (three) times daily. (Patient not taking: Reported on 03/15/2019) 22 g 0   No current facility-administered medications for this visit.      Review of Systems  Constitutional: Negative for chills, fever, malaise/fatigue and weight loss.       Feels "good."  HENT: Negative for hearing loss, nosebleeds, sinus pain, sore throat and tinnitus.   Eyes: Negative for blurred vision.  Respiratory: Negative for cough, hemoptysis and shortness of breath.   Cardiovascular: Negative for chest pain, palpitations and leg swelling.  Gastrointestinal: Negative for abdominal pain, constipation, diarrhea, heartburn, nausea and vomiting.  Genitourinary: Negative for dysuria, hematuria and urgency.  Musculoskeletal: Positive for joint pain (knee). Negative for back pain and myalgias.  Skin: Negative for itching and rash.  Neurological: Negative for dizziness, tingling, sensory change, weakness  and headaches.  Endo/Heme/Allergies: Does not bruise/bleed easily.  Psychiatric/Behavioral: Negative for depression and memory loss. The patient is not nervous/anxious and does not have insomnia.   All other systems reviewed and are negative.   Performance status (ECOG): 0  Physical Exam  Constitutional: She is oriented to person, place, and time. She appears well-developed and well-nourished. No distress.  HENT:  Head: Normocephalic and atraumatic.  Short brown hair.  Eyes: Conjunctivae and EOM are normal. No scleral icterus.  Glasses.  Brown eyes.  Neurological: She is alert and oriented to person, place, and time. She has normal reflexes.  Skin: She is not diaphoretic.  Psychiatric: She has a normal mood and affect. Her behavior is normal. Judgment and thought content normal.    No visits with results within 3 Day(s) from this visit.  Latest known visit with results is:  Appointment on 03/09/2019  Component Date Value Ref Range Status  . Thyroglobulin 03/09/2019 <2.0  ng/mL Final   Comment: (NOTE) This test was developed and its performance characteristics determined by [facility]. It has not been cleared or approved by the Food and Drug Administration. Reference Range: Pubertal Children and Adults: <40 According to the [facility]  of Clinical Biochemistry, the reference interval for Thyroglobulin (TG) should be related to euthyroid patients and not for patients who underwent thyroidectomy.  TG reference intervals for these patients depend on the residual mass of the thyroid tissue left after surgery.  Establishing a post-operative baseline is recommended.  The assay quantitation limit is 2.0 ng/mL. Performed At: [facility] [place], [place] [id number] [place] Sheral Apley MD [id number]   . Thyroglobulin Antibody 03/09/2019 <1.0  0.0 - 0.9 IU/mL Final   Comment: (NOTE) Thyroglobulin Antibody measured by [facility] Methodology  Performed At: [facility] [place], [place] [id number] Rush Farmer MD [id number]   . Free T4 03/09/2019 0.82  0.61 - 1.12 ng/dL Final   Comment: (NOTE) Biotin ingestion may interfere with free T4 tests. If the results are inconsistent with the TSH level, previous test results, or the clinical presentation, then consider biotin interference. If needed, order repeat testing after stopping biotin. Performed at [facility], [address] [place], [place] [address]   . TSH 03/09/2019 1.125  0.350 - 4.500 uIU/mL Final   Comment: Performed by a 3rd Generation assay with a functional sensitivity of <=0.01 uIU/mL. Performed at [facility], [address], [place], [place] [address]   . Sodium 03/09/2019 135  135 - 145 mmol/L Final  . Potassium 03/09/2019 3.3* 3.5 - 5.1 mmol/L Final  . Chloride 03/09/2019 102  98 - 111 mmol/L Final  . CO2 03/09/2019 26  22 - 32 mmol/L Final  . Glucose, Bld 03/09/2019 93  70 - 99 mg/dL Final  . BUN 03/09/2019 20  6 - 20 mg/dL Final  . Creatinine, Ser 03/09/2019 0.63  0.44 - 1.00 mg/dL Final  . Calcium 03/09/2019 8.3* 8.9 - 10.3 mg/dL Final  . Total Protein 03/09/2019 7.5  6.5 - 8.1 g/dL Final  . Albumin 03/09/2019 3.7  3.5 - 5.0 g/dL Final  . AST 03/09/2019 24  15 - 41 U/L Final  . ALT 03/09/2019 24  0 - 44 U/L Final  . Alkaline Phosphatase 03/09/2019 60  38 - 126 U/L Final  . Total Bilirubin 03/09/2019 0.4  0.3 - 1.2 mg/dL Final  . GFR calc non Af Amer 03/09/2019 >60  >60 mL/min Final  . GFR calc Af Amer 03/09/2019 >60  >60 mL/min Final  . Anion gap 03/09/2019 7  5 - 15 Final   Performed at [facility], [address] [place], [place] [address]  . WBC 03/09/2019 4.8  4.0 - 10.5 K/uL Final  . RBC 03/09/2019 4.03  3.87 - 5.11 MIL/uL Final  . Hemoglobin 03/09/2019 12.8  12.0 - 15.0 g/dL Final  . HCT 03/09/2019 37.7  36.0 - 46.0 % Final  . MCV 03/09/2019 93.5  80.0 - 100.0 fL Final  . MCH  03/09/2019 31.8  26.0 - 34.0 pg Final  . MCHC 03/09/2019 34.0  30.0 - 36.0 g/dL Final  . RDW 03/09/2019 14.6  11.5 - 15.5 % Final  . Platelets 03/09/2019 171  150 - 400 K/uL Final  . nRBC 03/09/2019 0.0  0.0 - 0.2 % Final  . Neutrophils Relative % 03/09/2019 53  % Final  . Neutro Abs 03/09/2019 2.5  1.7 - 7.7 K/uL Final  . Lymphocytes Relative 03/09/2019 31  % Final  . Lymphs Abs 03/09/2019 1.5  0.7 - 4.0 K/uL Final  . Monocytes Relative 03/09/2019 15  % Final  . Monocytes Absolute 03/09/2019 0.7  0.1 -  1.0 K/uL Final  . Eosinophils Relative 03/09/2019 1  % Final  . Eosinophils Absolute 03/09/2019 0.1  0.0 - 0.5 K/uL Final  . Basophils Relative 03/09/2019 0  % Final  . Basophils Absolute 03/09/2019 0.0  0.0 - 0.1 K/uL Final  . Immature Granulocytes 03/09/2019 0  % Final  . Abs Immature Granulocytes 03/09/2019 0.02  0.00 - 0.07 K/uL Final   Performed at [facility], [address], [place], [place] [address]    Assessment:  Leah Rogers is a 58 y.o. female with stage II papillary thyroid carcinoma s/p total thyroidectomy on 10/14/2015.  Pathology revealed papillary thyroid carcinoma, focally cystic.  There was a 3.5 cm follicular adenomatoid nodule in the right lobe.  There was a 0.3 cm calcified follicular nodule in the superior left lobe.  Pathologic stage was pT2Nx.  PET scan on 11/07/2015 revealed postsurgical changes identified within the thyroid bed.  There were no specific findings identified to suggest FDG avid thyroid cancer metastases.  She received RAI (70.2 mCi I-131) with Thyrogen stimulation on 11/18/2015.  Whole body I-131 scan on 11/28/2015 revealed I-131 uptake with thyroid remnant.  Otherwise negative whole-body I-131 scan without evidence of iodine-avid metastatic disease.  Symptomatically, she feels fine.  She notes knee pain secondary to arthritis.  Plan: 1.   Review labs from 03/09/2019. 2.   Stage II papillary thyroid carcinoma  Review entire  medical history, diagnosis and management of thyroid carcinoma.  Patient status post thyroidectomy on 10/14/2015.    She received radioactive iodine on  11/18/2015.    Currently on Synthroid 125 mcg a day.  Continue surveillance. 3.   Heath maintenenace  Bilateral screening mammogram. 4.   RTC in 6 months for labs (TSH and free T4). 5.   RTC in 1 year for MD assess, labs (CBC with diff, CMP, TSH, free T4, thyroglobulin, thyroglobulin antibody).  I discussed the assessment and treatment plan with the patient.  The patient was provided an opportunity to ask questions and all were answered.  The patient agreed with the plan and demonstrated an understanding of the instructions.  The patient was advised to call back or seek an in person evaluation if the symptoms worsen or if the condition fails to improve as anticipated.   Nolon Stalls, MD, PhD  03/15/2019, 3:18 PM  I, Selena Batten, am acting as scribe for [employer]. Mike Gip, MD, PhD.  I, Melissa C. Mike Gip, MD, have reviewed the above documentation for accuracy and completeness, and I agree with the above.

## 2019-03-15 ENCOUNTER — Encounter: Payer: Self-pay | Admitting: Hematology and Oncology

## 2019-03-15 ENCOUNTER — Inpatient Hospital Stay (HOSPITAL_BASED_OUTPATIENT_CLINIC_OR_DEPARTMENT_OTHER): Payer: BC Managed Care – PPO | Admitting: Hematology and Oncology

## 2019-03-15 DIAGNOSIS — C73 Malignant neoplasm of thyroid gland: Secondary | ICD-10-CM

## 2019-03-15 DIAGNOSIS — Z0000 Encounter for general adult medical examination without abnormal findings: Secondary | ICD-10-CM | POA: Diagnosis not present

## 2019-03-15 NOTE — Progress Notes (Signed)
The patient c/o constant pain noted to bilateral knees/ Pain level today 8. The patient Name and DOB has been verified by phone today.

## 2019-03-21 DIAGNOSIS — E890 Postprocedural hypothyroidism: Secondary | ICD-10-CM | POA: Diagnosis not present

## 2019-03-28 DIAGNOSIS — E890 Postprocedural hypothyroidism: Secondary | ICD-10-CM | POA: Diagnosis not present

## 2019-03-28 DIAGNOSIS — Z85850 Personal history of malignant neoplasm of thyroid: Secondary | ICD-10-CM | POA: Diagnosis not present

## 2019-04-05 ENCOUNTER — Other Ambulatory Visit: Payer: Self-pay

## 2019-04-05 ENCOUNTER — Ambulatory Visit
Admission: RE | Admit: 2019-04-05 | Discharge: 2019-04-05 | Disposition: A | Discharge status: Home / Self Care | Payer: BC Managed Care – PPO | Source: Ambulatory Visit | Attending: Hematology and Oncology | Admitting: Hematology and Oncology

## 2019-04-05 DIAGNOSIS — Z0000 Encounter for general adult medical examination without abnormal findings: Secondary | ICD-10-CM

## 2019-04-05 DIAGNOSIS — Z1231 Encounter for screening mammogram for malignant neoplasm of breast: Secondary | ICD-10-CM | POA: Insufficient documentation

## 2019-05-17 DIAGNOSIS — M1712 Unilateral primary osteoarthritis, left knee: Secondary | ICD-10-CM | POA: Diagnosis not present

## 2019-06-01 ENCOUNTER — Other Ambulatory Visit: Payer: Self-pay

## 2019-06-01 ENCOUNTER — Encounter: Payer: Self-pay | Admitting: Internal Medicine

## 2019-06-01 ENCOUNTER — Ambulatory Visit (INDEPENDENT_AMBULATORY_CARE_PROVIDER_SITE_OTHER): Payer: BC Managed Care – PPO | Admitting: Internal Medicine

## 2019-06-01 VITALS — BP 128/78 | HR 72 | Ht 65.0 in | Wt 252.0 lb

## 2019-06-01 DIAGNOSIS — E876 Hypokalemia: Secondary | ICD-10-CM

## 2019-06-01 DIAGNOSIS — Z01818 Encounter for other preprocedural examination: Secondary | ICD-10-CM

## 2019-06-01 DIAGNOSIS — Z23 Encounter for immunization: Secondary | ICD-10-CM

## 2019-06-01 NOTE — Progress Notes (Signed)
Date:  06/01/2019   Name:  Leah Rogers   DOB:  03-10-1961   MRN:  [id number]   Chief Complaint: surgery clearance (Dr Tanda Rockers- Left Knee replacement. )  HPI  Pt is scheduled for a left TKA.  She is here for pre-op clearance.  She has never had any problems with anesthesia - last surgery for thyroid 3 years ago.  She generally feels well.  She has limited exercise ability due to her knee OA.   Labs done recently showed low potassium but normal CBC and thyroid.  She is not on a potassium supplement. She denies chest pain, SOB on exertion, PND.  She has very mild intermittent ankle edema.  Review of Systems  Constitutional: Negative for chills, fatigue, fever and unexpected weight change.  HENT: Negative for congestion, sinus pressure and trouble swallowing.   Eyes: Negative for visual disturbance.  Respiratory: Negative for chest tightness, shortness of breath and wheezing.   Cardiovascular: Negative for chest pain, palpitations and leg swelling.  Gastrointestinal: Negative for abdominal pain, constipation and diarrhea.  Musculoskeletal: Positive for arthralgias and gait problem.  Skin: Negative for rash.  Neurological: Negative for dizziness, light-headedness and headaches.  Psychiatric/Behavioral: Positive for sleep disturbance (knee pain). Negative for dysphoric mood. The patient is not nervous/anxious.     Patient Active Problem List   Diagnosis Date Noted  . Infected sebaceous cyst 10/26/2018  . Screen for colon cancer 04/01/2017  . S/P total thyroidectomy 10/14/2015  . Papillary thyroid carcinoma ([place]) 10/01/2015  . Osteoarthritis of left knee 09/02/2015  . Hidradenitis axillaris 09/02/2015    No Known Allergies  Past Surgical History:  Procedure Laterality Date  . COLONOSCOPY WITH PROPOFOL N/A 03/20/2016   Procedure: COLONOSCOPY WITH PROPOFOL;  Surgeon: Lucilla Lame, MD;  Location: [facility];  Service: Endoscopy;  Laterality: N/A;  . ENDOMETRIAL BIOPSY  2013    benign- post menopausal bleeding  . THYROIDECTOMY N/A 10/14/2015   Procedure: THYROIDECTOMY;  Surgeon: Margaretha Sheffield, MD;  Location: [facility];  Service: ENT;  Laterality: N/A;  . TUBAL LIGATION      Social History   Tobacco Use  . Smoking status: Former Smoker    Packs/day: 0.50    Years: 18.00    Pack years: 9.00    Types: Cigarettes    Quit date: 03/21/2005    Years since quitting: 14.2  . Smokeless tobacco: Never Used  Substance Use Topics  . Alcohol use: No    Alcohol/week: 0.0 standard drinks  . Drug use: No     Medication list has been reviewed and updated.  Current Meds  Medication Sig  . calcium gluconate 650 MG tablet Take 650 mg by mouth daily.  . Cholecalciferol (VITAMIN D3) 1.25 MG (50000 UT) CAPS Vitamin D3  . cyanocobalamin 500 MCG tablet Take 500 mcg by mouth daily.  . ferrous sulfate 325 (65 FE) MG tablet Take 325 mg by mouth daily with breakfast.  . levothyroxine (SYNTHROID, LEVOTHROID) 125 MCG tablet Take 125 mcg by mouth daily before breakfast.   . mupirocin ointment (BACTROBAN) 2 % Apply 1 application topically 3 (three) times daily.    PHQ 2/9 Scores 06/01/2019  PHQ - 2 Score 0    BP Readings from Last 3 Encounters:  06/01/19 128/78  12/21/18 (!) 145/104  12/09/18 128/82    Physical Exam Vitals signs and nursing note reviewed.  Constitutional:      General: She is not in acute distress.    Appearance: She  is well-developed. She is obese.  HENT:     Head: Normocephalic and atraumatic.  Neck:     Musculoskeletal: Normal range of motion.     Vascular: No carotid bruit.  Cardiovascular:     Rate and Rhythm: Normal rate and regular rhythm.     Pulses: Normal pulses.     Heart sounds: No murmur.  Pulmonary:     Effort: Pulmonary effort is normal. No respiratory distress.     Breath sounds: No wheezing or rhonchi.  Musculoskeletal: Normal range of motion.     Right lower leg: Edema (trace) present.     Left lower leg: Edema (trace) present.   Lymphadenopathy:     Cervical: No cervical adenopathy.  Skin:    General: Skin is warm and dry.     Capillary Refill: Capillary refill takes less than 2 seconds.     Findings: No rash.  Neurological:     General: No focal deficit present.     Mental Status: She is alert and oriented to person, place, and time.  Psychiatric:        Behavior: Behavior normal.        Thought Content: Thought content normal.     Wt Readings from Last 3 Encounters:  06/01/19 252 lb (114.3 kg)  12/21/18 249 lb 9.6 oz (113.2 kg)  12/09/18 250 lb (113.4 kg)    BP 128/78   Pulse 72   Ht 5\' 5"  (1.651 m)   Wt 252 lb (114.3 kg)   SpO2 98%   BMI 41.93 kg/m   Assessment and Plan: 1. Pre-op evaluation Normal exam except for weight Cleared for surgery - form to be faxed - EKG 12-Lead - NSR @  63; WNL  2. Hypokalemia Recheck labs and supplement if needed - Basic metabolic panel  3. Need for immunization against influenza - Flu Vaccine QUAD 36+ mos IM   Partially dictated using Editor, commissioning. Any errors are unintentional.  Halina Maidens, MD [facility]  06/01/2019

## 2019-06-02 LAB — BASIC METABOLIC PANEL
BUN/Creatinine Ratio: 33 — ABNORMAL HIGH (ref 9–23)
BUN: 24 mg/dL (ref 6–24)
CO2: 24 mmol/L (ref 20–29)
Calcium: 9.3 mg/dL (ref 8.7–10.2)
Chloride: 104 mmol/L (ref 96–106)
Creatinine, Ser: 0.73 mg/dL (ref 0.57–1.00)
GFR calc Af Amer: 104 mL/min/{1.73_m2} (ref >59)
GFR calc non Af Amer: 90 mL/min/{1.73_m2} (ref >59)
Glucose: 90 mg/dL (ref 65–99)
Potassium: 4 mmol/L (ref 3.5–5.2)
Sodium: 143 mmol/L (ref 134–144)

## 2019-06-06 DIAGNOSIS — M1712 Unilateral primary osteoarthritis, left knee: Secondary | ICD-10-CM | POA: Diagnosis not present

## 2019-06-06 DIAGNOSIS — M25662 Stiffness of left knee, not elsewhere classified: Secondary | ICD-10-CM | POA: Diagnosis not present

## 2019-06-06 DIAGNOSIS — M25562 Pain in left knee: Secondary | ICD-10-CM | POA: Diagnosis not present

## 2019-06-20 DIAGNOSIS — M1712 Unilateral primary osteoarthritis, left knee: Secondary | ICD-10-CM | POA: Diagnosis not present

## 2019-07-10 DIAGNOSIS — M1712 Unilateral primary osteoarthritis, left knee: Secondary | ICD-10-CM | POA: Diagnosis not present

## 2019-08-16 DIAGNOSIS — Z96652 Presence of left artificial knee joint: Secondary | ICD-10-CM | POA: Diagnosis not present

## 2019-08-23 DIAGNOSIS — Z471 Aftercare following joint replacement surgery: Secondary | ICD-10-CM | POA: Diagnosis not present

## 2019-08-23 DIAGNOSIS — Z96652 Presence of left artificial knee joint: Secondary | ICD-10-CM | POA: Diagnosis not present

## 2019-08-29 DIAGNOSIS — Z96652 Presence of left artificial knee joint: Secondary | ICD-10-CM | POA: Diagnosis not present

## 2019-08-31 DIAGNOSIS — Z96652 Presence of left artificial knee joint: Secondary | ICD-10-CM | POA: Diagnosis not present

## 2019-09-13 DIAGNOSIS — Z96652 Presence of left artificial knee joint: Secondary | ICD-10-CM | POA: Diagnosis not present

## 2019-09-18 ENCOUNTER — Inpatient Hospital Stay: Payer: BC Managed Care – PPO | Attending: Hematology and Oncology

## 2019-09-18 DIAGNOSIS — Z96652 Presence of left artificial knee joint: Secondary | ICD-10-CM | POA: Diagnosis not present

## 2019-09-20 DIAGNOSIS — Z96652 Presence of left artificial knee joint: Secondary | ICD-10-CM | POA: Diagnosis not present

## 2019-09-27 DIAGNOSIS — Z96652 Presence of left artificial knee joint: Secondary | ICD-10-CM | POA: Diagnosis not present

## 2019-09-29 DIAGNOSIS — Z96652 Presence of left artificial knee joint: Secondary | ICD-10-CM | POA: Diagnosis not present

## 2019-10-04 DIAGNOSIS — Z96652 Presence of left artificial knee joint: Secondary | ICD-10-CM | POA: Diagnosis not present

## 2019-10-06 DIAGNOSIS — Z96652 Presence of left artificial knee joint: Secondary | ICD-10-CM | POA: Diagnosis not present

## 2019-10-10 DIAGNOSIS — Z96652 Presence of left artificial knee joint: Secondary | ICD-10-CM | POA: Diagnosis not present

## 2019-10-11 DIAGNOSIS — Z96652 Presence of left artificial knee joint: Secondary | ICD-10-CM | POA: Diagnosis not present

## 2019-10-11 DIAGNOSIS — Z471 Aftercare following joint replacement surgery: Secondary | ICD-10-CM | POA: Diagnosis not present

## 2019-10-30 DIAGNOSIS — E890 Postprocedural hypothyroidism: Secondary | ICD-10-CM | POA: Diagnosis not present

## 2019-10-31 DIAGNOSIS — Z85850 Personal history of malignant neoplasm of thyroid: Secondary | ICD-10-CM | POA: Diagnosis not present

## 2019-10-31 DIAGNOSIS — E890 Postprocedural hypothyroidism: Secondary | ICD-10-CM | POA: Diagnosis not present

## 2019-12-02 ENCOUNTER — Ambulatory Visit: Payer: BC Managed Care – PPO | Attending: Internal Medicine

## 2019-12-02 DIAGNOSIS — Z23 Encounter for immunization: Secondary | ICD-10-CM

## 2019-12-02 NOTE — Progress Notes (Signed)
   Covid-19 Vaccination Clinic  Name:  DAILYNN MANERA    MRN: [id number] DOB: 01-Mar-1961  12/02/2019  Ms. Washam was observed post Covid-19 immunization for 15 minutes without incident. She was provided with Vaccine Information Sheet and instruction to access the V-Safe system.   Ms. Rodrigues was instructed to call 911 with any severe reactions post vaccine: Marland Kitchen Difficulty breathing  . Swelling of face and throat  . A fast heartbeat  . A bad rash all over body  . Dizziness and weakness   Immunizations Administered    Name Date Dose VIS Date Route   Pfizer COVID-19 Vaccine 12/02/2019 10:55 AM 0.3 mL 09/01/2019 Intramuscular   Manufacturer: [place]   Lot: [id number]   [place]: [id number]

## 2019-12-27 ENCOUNTER — Ambulatory Visit: Payer: BC Managed Care – PPO | Attending: Internal Medicine

## 2019-12-27 DIAGNOSIS — Z23 Encounter for immunization: Secondary | ICD-10-CM

## 2019-12-27 NOTE — Progress Notes (Signed)
   Covid-19 Vaccination Clinic  Name:  Leah Rogers    MRN: [id number] DOB: [birth year]  12/27/2019  Leah Rogers was observed post Covid-19 immunization for 15 minutes without incident. She was provided with Vaccine Information Sheet and instruction to access the V-Safe system.   Leah Rogers was instructed to call 911 with any severe reactions post vaccine: Marland Kitchen Difficulty breathing  . Swelling of face and throat  . A fast heartbeat  . A bad rash all over body  . Dizziness and weakness   Immunizations Administered    Name Date Dose VIS Date Route   Pfizer COVID-19 Vaccine 12/27/2019  4:35 PM 0.3 mL 09/01/2019 Intramuscular   Manufacturer: [place]   Lot: [id number]   [place]: [id number]

## 2020-02-06 ENCOUNTER — Inpatient Hospital Stay
Admit: 2020-02-06 | Discharge: 2020-02-06 | Disposition: A | Discharge status: Home / Self Care | Payer: BC Managed Care – PPO | Attending: Internal Medicine | Admitting: Internal Medicine

## 2020-02-06 ENCOUNTER — Inpatient Hospital Stay: Payer: BC Managed Care – PPO

## 2020-02-06 ENCOUNTER — Emergency Department: Payer: BC Managed Care – PPO

## 2020-02-06 ENCOUNTER — Inpatient Hospital Stay
Admission: EM | Admit: 2020-02-06 | Discharge: 2020-02-08 | DRG: 065 | Disposition: A | Discharge status: Home / Self Care | Payer: BC Managed Care – PPO | Attending: Internal Medicine | Admitting: Internal Medicine

## 2020-02-06 ENCOUNTER — Other Ambulatory Visit: Payer: Self-pay

## 2020-02-06 DIAGNOSIS — Z6838 Body mass index (BMI) 38.0-38.9, adult: Secondary | ICD-10-CM

## 2020-02-06 DIAGNOSIS — R531 Weakness: Secondary | ICD-10-CM | POA: Diagnosis not present

## 2020-02-06 DIAGNOSIS — Z8673 Personal history of transient ischemic attack (TIA), and cerebral infarction without residual deficits: Secondary | ICD-10-CM

## 2020-02-06 DIAGNOSIS — G8191 Hemiplegia, unspecified affecting right dominant side: Secondary | ICD-10-CM | POA: Diagnosis present

## 2020-02-06 DIAGNOSIS — M1990 Unspecified osteoarthritis, unspecified site: Secondary | ICD-10-CM | POA: Diagnosis present

## 2020-02-06 DIAGNOSIS — R4781 Slurred speech: Secondary | ICD-10-CM | POA: Diagnosis not present

## 2020-02-06 DIAGNOSIS — Z87891 Personal history of nicotine dependence: Secondary | ICD-10-CM | POA: Diagnosis not present

## 2020-02-06 DIAGNOSIS — I6381 Other cerebral infarction due to occlusion or stenosis of small artery: Secondary | ICD-10-CM | POA: Diagnosis not present

## 2020-02-06 DIAGNOSIS — R29707 NIHSS score 7: Secondary | ICD-10-CM | POA: Diagnosis present

## 2020-02-06 DIAGNOSIS — E890 Postprocedural hypothyroidism: Secondary | ICD-10-CM

## 2020-02-06 DIAGNOSIS — Z79890 Hormone replacement therapy (postmenopausal): Secondary | ICD-10-CM

## 2020-02-06 DIAGNOSIS — I639 Cerebral infarction, unspecified: Secondary | ICD-10-CM

## 2020-02-06 DIAGNOSIS — Z79899 Other long term (current) drug therapy: Secondary | ICD-10-CM | POA: Diagnosis not present

## 2020-02-06 DIAGNOSIS — I6359 Cerebral infarction due to unspecified occlusion or stenosis of other cerebral artery: Secondary | ICD-10-CM | POA: Diagnosis not present

## 2020-02-06 DIAGNOSIS — I779 Disorder of arteries and arterioles, unspecified: Secondary | ICD-10-CM

## 2020-02-06 DIAGNOSIS — Z20822 Contact with and (suspected) exposure to covid-19: Secondary | ICD-10-CM | POA: Diagnosis not present

## 2020-02-06 DIAGNOSIS — R29810 Facial weakness: Secondary | ICD-10-CM | POA: Diagnosis present

## 2020-02-06 DIAGNOSIS — I739 Peripheral vascular disease, unspecified: Secondary | ICD-10-CM | POA: Diagnosis present

## 2020-02-06 DIAGNOSIS — I6602 Occlusion and stenosis of left middle cerebral artery: Secondary | ICD-10-CM | POA: Diagnosis not present

## 2020-02-06 DIAGNOSIS — Z85850 Personal history of malignant neoplasm of thyroid: Secondary | ICD-10-CM

## 2020-02-06 DIAGNOSIS — I6930 Unspecified sequelae of cerebral infarction: Secondary | ICD-10-CM | POA: Diagnosis present

## 2020-02-06 DIAGNOSIS — R29704 NIHSS score 4: Secondary | ICD-10-CM | POA: Diagnosis not present

## 2020-02-06 DIAGNOSIS — I671 Cerebral aneurysm, nonruptured: Secondary | ICD-10-CM | POA: Diagnosis present

## 2020-02-06 DIAGNOSIS — R29702 NIHSS score 2: Secondary | ICD-10-CM | POA: Diagnosis not present

## 2020-02-06 HISTORY — DX: Disorder of thyroid, unspecified: E07.9

## 2020-02-06 HISTORY — DX: Personal history of transient ischemic attack (TIA), and cerebral infarction without residual deficits: Z86.73

## 2020-02-06 LAB — COMPREHENSIVE METABOLIC PANEL
ALT: 21 U/L (ref 0–44)
AST: 23 U/L (ref 15–41)
Albumin: 4.2 g/dL (ref 3.5–5.0)
Alkaline Phosphatase: 75 U/L (ref 38–126)
Anion gap: 11 (ref 5–15)
BUN: 23 mg/dL — ABNORMAL HIGH (ref 6–20)
CO2: 25 mmol/L (ref 22–32)
Calcium: 8.7 mg/dL — ABNORMAL LOW (ref 8.9–10.3)
Chloride: 101 mmol/L (ref 98–111)
Creatinine, Ser: 0.8 mg/dL (ref 0.44–1.00)
GFR calc Af Amer: >60 mL/min (ref >60)
GFR calc non Af Amer: >60 mL/min (ref >60)
Glucose, Bld: 112 mg/dL — ABNORMAL HIGH (ref 70–99)
Potassium: 3.8 mmol/L (ref 3.5–5.1)
Sodium: 137 mmol/L (ref 135–145)
Total Bilirubin: 0.4 mg/dL (ref 0.3–1.2)
Total Protein: 8.4 g/dL — ABNORMAL HIGH (ref 6.5–8.1)

## 2020-02-06 LAB — GLUCOSE, CAPILLARY: Glucose-Capillary: 94 mg/dL (ref 70–99)

## 2020-02-06 LAB — DIFFERENTIAL
Abs Immature Granulocytes: 0.02 10*3/uL (ref 0.00–0.07)
Basophils Absolute: 0 10*3/uL (ref 0.0–0.1)
Basophils Relative: 0 %
Eosinophils Absolute: 0 10*3/uL (ref 0.0–0.5)
Eosinophils Relative: 1 %
Immature Granulocytes: 0 %
Lymphocytes Relative: 20 %
Lymphs Abs: 1.3 10*3/uL (ref 0.7–4.0)
Monocytes Absolute: 0.7 10*3/uL (ref 0.1–1.0)
Monocytes Relative: 12 %
Neutro Abs: 4.4 10*3/uL (ref 1.7–7.7)
Neutrophils Relative %: 67 %

## 2020-02-06 LAB — CBC
HCT: 39.3 % (ref 36.0–46.0)
Hemoglobin: 13.6 g/dL (ref 12.0–15.0)
MCH: 31.4 pg (ref 26.0–34.0)
MCHC: 34.6 g/dL (ref 30.0–36.0)
MCV: 90.8 fL (ref 80.0–100.0)
Platelets: 195 10*3/uL (ref 150–400)
RBC: 4.33 MIL/uL (ref 3.87–5.11)
RDW: 15.1 % (ref 11.5–15.5)
WBC: 6.5 10*3/uL (ref 4.0–10.5)
nRBC: 0 % (ref 0.0–0.2)

## 2020-02-06 LAB — SARS CORONAVIRUS 2 BY RT PCR (HOSPITAL ORDER, PERFORMED IN ~~LOC~~ HOSPITAL LAB): SARS Coronavirus 2: NEGATIVE

## 2020-02-06 LAB — PROTIME-INR
INR: 1 (ref 0.8–1.2)
Prothrombin Time: 12.4 seconds (ref 11.4–15.2)

## 2020-02-06 LAB — APTT: aPTT: 27 seconds (ref 24–36)

## 2020-02-06 LAB — ECHOCARDIOGRAM COMPLETE
Height: 66 in
Weight: 3840 oz

## 2020-02-06 MED ORDER — ACETAMINOPHEN 160 MG/5ML PO SOLN
650.0000 mg | ORAL | Status: DC | PRN
  Filled 2020-02-06: qty 20.3

## 2020-02-06 MED ORDER — VITAMIN D 25 MCG (1000 UNIT) PO TABS: 1000.0000 [IU] | ORAL_TABLET | Freq: Every day | ORAL | Status: DC

## 2020-02-06 MED ORDER — CALCIUM GLUCONATE 500 MG PO TABS: 500.0000 mg | ORAL_TABLET | Freq: Every day | ORAL | Status: DC

## 2020-02-06 MED ORDER — ACETAMINOPHEN 650 MG RE SUPP: 650.0000 mg | RECTAL | Status: DC | PRN

## 2020-02-06 MED ORDER — ASPIRIN EC 325 MG PO TBEC: 325.0000 mg | DELAYED_RELEASE_TABLET | Freq: Every day | ORAL | Status: DC

## 2020-02-06 MED ORDER — SODIUM CHLORIDE 0.9% FLUSH: 3.0000 mL | Freq: Once | INTRAVENOUS | Status: DC

## 2020-02-06 MED ORDER — STROKE: EARLY STAGES OF RECOVERY BOOK: Freq: Once | Status: DC

## 2020-02-06 MED ORDER — SENNOSIDES-DOCUSATE SODIUM 8.6-50 MG PO TABS: 1.0000 | ORAL_TABLET | Freq: Every evening | ORAL | Status: DC | PRN

## 2020-02-06 MED ORDER — SODIUM CHLORIDE 0.9 % IV SOLN: INTRAVENOUS | Status: DC

## 2020-02-06 MED ORDER — ASPIRIN 300 MG RE SUPP: 300.0000 mg | Freq: Every day | RECTAL | Status: DC

## 2020-02-06 MED ORDER — ASPIRIN 300 MG RE SUPP
300.0000 mg | Freq: Once | RECTAL | Status: AC
  Administered 2020-02-06: 300 mg via RECTAL
  Filled 2020-02-06: qty 1

## 2020-02-06 MED ORDER — ASPIRIN 81 MG PO CHEW: 324.0000 mg | CHEWABLE_TABLET | Freq: Once | ORAL | Status: DC

## 2020-02-06 MED ORDER — ACETAMINOPHEN 325 MG PO TABS: 650.0000 mg | ORAL_TABLET | ORAL | Status: DC | PRN

## 2020-02-06 MED ORDER — IOHEXOL 350 MG/ML SOLN
75.0000 mL | Freq: Once | INTRAVENOUS | Status: AC | PRN
  Administered 2020-02-06: 75 mL via INTRAVENOUS

## 2020-02-06 MED ORDER — LEVOTHYROXINE SODIUM 25 MCG PO TABS
125.0000 ug | ORAL_TABLET | Freq: Every day | ORAL | Status: DC
  Administered 2020-02-08: 125 ug via ORAL
  Filled 2020-02-06 (×2): qty 1

## 2020-02-06 MED ORDER — FERROUS SULFATE 325 (65 FE) MG PO TABS: 325.0000 mg | ORAL_TABLET | Freq: Every day | ORAL | Status: DC

## 2020-02-06 NOTE — ED Notes (Signed)
Pt episode of urinary incontinence, clothing soiled. Pt clothes and bedding changed and suction cath placed

## 2020-02-06 NOTE — ED Triage Notes (Signed)
Pt states while at work last night she had sudden onset right sided weakness with numbness, pt has noted facial droop. Denies any other sx. Denies any difficulty swallowing or eating since it started.

## 2020-02-06 NOTE — Progress Notes (Signed)
OT Cancellation Note  Patient Details Name: Leah Rogers MRN: [id number] DOB: [birth year]   Cancelled Treatment:    Reason Eval/Treat Not Completed: Other (comment). Order received, chart reviewed. Pt out of room for testing upon earlier attempt. Will re-attempt OT evaluation at later date/time as pt is available and medically appropriate.  Jeni Salles, MPH, MS, OTR/L ascom [phone] 02/06/20, 4:00 PM

## 2020-02-06 NOTE — Evaluation (Addendum)
Clinical/Bedside Swallow Evaluation Patient Details  Name: SPOORTHI TRIMBOLI MRN: [id number] Date of Birth: 13-Sep-1961  Today's Date: 02/06/2020 Time: SLP Start Time (ACUTE ONLY): 1150 SLP Stop Time (ACUTE ONLY): 1250 SLP Time Calculation (min) (ACUTE ONLY): 60 min  Past Medical History:  Past Medical History:  Diagnosis Date  . Arthritis   . Cancer ([place])    Thyroid  . Heart murmur   . Thyroid disease    Past Surgical History:  Past Surgical History:  Procedure Laterality Date  . COLONOSCOPY WITH PROPOFOL N/A 03/20/2016   Procedure: COLONOSCOPY WITH PROPOFOL;  Surgeon: Lucilla Lame, MD;  Location: [facility];  Service: Endoscopy;  Laterality: N/A;  . ENDOMETRIAL BIOPSY  2013   benign- post menopausal bleeding  . THYROIDECTOMY N/A 10/14/2015   Procedure: THYROIDECTOMY;  Surgeon: Margaretha Sheffield, MD;  Location: [facility];  Service: ENT;  Laterality: N/A;  . TUBAL LIGATION     HPI:  Pt is a 59 y.o. female with medical history significant for arthritis, thyroid cancer who presents to the emergency room for evaluation of sudden onset right-sided weakness and numbness with associated facial droop.  She denies having any headaches, blurry vision, dizziness or lightheadedness.  She denies any difficulty swallowing.  Onset of symptoms was about 1 AM while at work and patient presented to the emergency room at about 9:15 AM.  Since this morning, pt has c/o difficulty w/ her speech; more effortful swallowing. Facial asymmetry is noted on R side; RU weakness reported by pt as well.    Assessment / Plan / Recommendation Clinical Impression  Pt exhibited Moderate oral phase dysphagia w/ decreased lingual movement, coordination, and strength(suspicion of hypoglossal nerve dammage). With the decreased lingual strength and coordination for bolus management and A-P transfer, pt's risk for pharyngeal phase dysphagia is increased. Also noted decreased coordination and min slower pharyngeal swallowing --  it seemed effortful for pt to generate and complete a pharyngeal swallow w/ trials of ice chip(residue water) as well as own saliva(noted a min increased amount of oral saliva). Noted Head CT report indicating possible "recent pontine infarction" -- MRI results pending. W/ verbal cues and Time, pt appeared to adequately manage Single ice chips held orally as she took her time to manipulate them orally during preparation to transfer for swallowing. This oral phase time was prolonged and min effortful. Pharyngeal swallows were slow to engage/complete; audible and effortful at times. Pt required Time to use lingual sweeping to clear any remaining saliva/residue and f/u w/ 2nd swallow. With the presenting oropharyngeal phase dysphagia and increased risk for aspiration of oral intake, no further po trial conistencies were attempted/assessed. OM exam revealed R labial/facial asymmetry; slow, deliberate lingual movements w/ min decreased strength both posteriorly/protrusion. RUE weakness noted. Dysarthric speech+.  Recommend continue NPO status w/ therapeutic Single ice chip trials to engage oropharyngeal swallowing. Strict aspiration precautions and oral care Prior to Single ice chips. ST services will f/u tomorrow w/ ongoing assessmnet of swallowing in hopes to establish leas restrictive po diet consistency; MBSS TBD. Recommend altenative means for all Medications. Frequent oral care for hygiene and stimulation of swallowing.  The above was discussed w/ pt, husband and MD/NSG.  SLP Visit Diagnosis: Dysphagia, oropharyngeal phase (R13.12)    Aspiration Risk  Moderate aspiration risk;Severe aspiration risk;Risk for inadequate nutrition/hydration    Diet Recommendation  NPO w/ frequent oral care for hygiene and stimulation of swallowing; aspiration precautions  Medication Administration: Via alternative means    Other  Recommendations Recommended  Consults: (Neurology following) Oral Care Recommendations: Oral  care QID;Patient independent with oral care Other Recommendations: (TBD)   Follow up Recommendations (TBD)      Frequency and Duration min 3x week  2 weeks       Prognosis Prognosis for Safe Diet Advancement: Fair Barriers to Reach Goals: (n/a)      Swallow Study   General Date of Onset: 02/06/20 HPI: Pt is a 59 y.o. female with medical history significant for arthritis, thyroid cancer who presents to the emergency room for evaluation of sudden onset right-sided weakness and numbness with associated facial droop.  She denies having any headaches, blurry vision, dizziness or lightheadedness.  She denies any difficulty swallowing.  Onset of symptoms was about 1 AM while at work and patient presented to the emergency room at about 9:15 AM.  Since this morning, pt has c/o difficulty w/ her speech; more effortful swallowing. Facial asymmetry is noted on R side; RU weakness reported by pt as well.  Type of Study: Bedside Swallow Evaluation Previous Swallow Assessment: none Diet Prior to this Study: NPO Temperature Spikes Noted: No(wbc 6.5) Respiratory Status: Room air History of Recent Intubation: No Behavior/Cognition: Alert;Cooperative;Pleasant mood Oral Cavity Assessment: Excessive secretions Oral Care Completed by SLP: Yes Oral Cavity - Dentition: Adequate natural dentition Vision: Functional for self-feeding Self-Feeding Abilities: Able to feed self;Needs assist;Needs set up;Total assist(RUE weakness) Patient Positioning: Upright in bed(needed positioning support) Baseline Vocal Quality: Normal;Low vocal intensity(adequate) Volitional Cough: Strong(slow to deliver) Volitional Swallow: Able to elicit(slow to deliver)    Oral/Motor/Sensory Function Overall Oral Motor/Sensory Function: Moderate impairment Facial ROM: Reduced right Facial Symmetry: Abnormal symmetry right Facial Strength: Reduced right Facial Sensation: Reduced right Lingual ROM: Reduced right(slight) Lingual  Symmetry: Within Functional Limits(grossly) Lingual Strength: Reduced;Suspected CN XII (hypoglossal) dysfunction Lingual Sensation: Within Functional Limits Velum: Within Functional Limits(grossly) Mandible: Within Functional Limits   Ice Chips Ice chips: Impaired Presentation: Spoon(fed; 10 trials) Oral Phase Impairments: Reduced labial seal;Reduced lingual movement/coordination;Impaired mastication Oral Phase Functional Implications: Prolonged oral transit(significant) Pharyngeal Phase Impairments: Suspected delayed Swallow;Decreased hyoid-laryngeal movement(slow to initiate/complete pharyngeal swallows) Other Comments: verbal cues/time given w/ each trial   Thin Liquid Thin Liquid: Not tested    Nectar Thick Nectar Thick Liquid: Not tested   Honey Thick Honey Thick Liquid: Not tested   Puree Puree: Not tested   Solid     Solid: Not tested       Orinda Kenner, MS, CCC-SLP Darianny Momon 02/06/2020,3:49 PM

## 2020-02-06 NOTE — H&P (Signed)
History and Physical    Leah Rogers [id number] DOB: 04/18/1961 DOA: 02/06/2020  PCP: Glean Hess, MD   Patient coming from: Home  I have personally briefly reviewed patient's old medical records in [place]  Chief Complaint: Weakness and facial droop  HPI: Leah Rogers is a 59 y.o. female with medical history significant for arthritis, thyroid cancer who presents to the emergency room for evaluation of sudden onset right-sided weakness and numbness with associated facial droop.  She denies having any headaches, blurry vision, dizziness or lightheadedness.  She denies any difficulty swallowing.  Onset of symptoms was about 1 AM while at work and patient presented to the emergency room at about 9:15 AM. CT scan of the head without contrast shows indeterminate low-density in the right side of the pons that could be an old or recent pontine infarction. Old appearing infarction in the right basal ganglia and deep white matter tracts. Twelve-lead EKG shows sinus rhythm with and LVH    ED Course: Patient who presents to the emergency room for evaluation of sudden onset right-sided weakness with numbness and right facial droop.  Onset was about 1 AM and patient presented to the emergency room at about 9:15 AM.  Review of Systems: As per HPI otherwise 10 point review of systems negative.    Past Medical History:  Diagnosis Date  . Arthritis   . Cancer ([place])    Thyroid  . Heart murmur   . Thyroid disease     Past Surgical History:  Procedure Laterality Date  . COLONOSCOPY WITH PROPOFOL N/A 03/20/2016   Procedure: COLONOSCOPY WITH PROPOFOL;  Surgeon: Lucilla Lame, MD;  Location: [place];  Service: Endoscopy;  Laterality: N/A;  . ENDOMETRIAL BIOPSY  2013   benign- post menopausal bleeding  . THYROIDECTOMY N/A 10/14/2015   Procedure: THYROIDECTOMY;  Surgeon: Margaretha Sheffield, MD;  Location: [facility];  Service: ENT;  Laterality: N/A;  . TUBAL LIGATION        reports that she quit smoking about 14 years ago. Her smoking use included cigarettes. She has a 9.00 pack-year smoking history. She has never used smokeless tobacco. She reports that she does not drink alcohol or use drugs.  No Known Allergies  Family History  Problem Relation Age of Onset  . Hypertension Mother   . Lung cancer Mother   . Heart attack Father   . Heart attack Brother   . Breast cancer Neg Hx      Prior to Admission medications   Medication Sig Start Date End Date Taking? Authorizing Provider  levothyroxine (SYNTHROID, LEVOTHROID) 125 MCG tablet Take 125 mcg by mouth daily before breakfast.  11/30/15  Yes [provider]  calcium gluconate 650 MG tablet Take 650 mg by mouth daily.    [provider]  Cholecalciferol (VITAMIN D3) 1.25 MG (50000 UT) CAPS Vitamin D3    [provider]  cyanocobalamin 500 MCG tablet Take 500 mcg by mouth daily.    [provider]  ferrous sulfate 325 (65 FE) MG tablet Take 325 mg by mouth daily with breakfast.    [provider]    Physical Exam: Vitals:   02/06/20 0917 02/06/20 0919 02/06/20 0947  BP: (!) 139/92  137/88  Pulse: 80  69  Resp: 18  12  Temp: 98.4 F (36.9 C)    TempSrc: Oral    SpO2: 98%  95%  Weight:  108.9 kg   Height:  5\' 6"  (1.676 m)  Vitals:   02/06/20 0917 02/06/20 0919 02/06/20 0947  BP: (!) 139/92  137/88  Pulse: 80  69  Resp: 18  12  Temp: 98.4 F (36.9 C)    TempSrc: Oral    SpO2: 98%  95%  Weight:  108.9 kg   Height:  5\' 6"  (1.676 m)     Constitutional: NAD, alert and oriented x 3, Eyes: PERRL, lids and conjunctivae normal ENMT: Mucous membranes are moist.  Neck: normal, supple, no masses, no thyromegaly Respiratory: clear to auscultation bilaterally, no wheezing, no crackles. Normal respiratory effort. No accessory muscle use.  Cardiovascular: Regular rate and rhythm, no murmurs / rubs / gallops. No extremity edema. 2+ pedal pulses. No  carotid bruits.  Abdomen: no tenderness, no masses palpated. No hepatosplenomegaly. Bowel sounds positive.  Musculoskeletal: no clubbing / cyanosis. No joint deformity upper and lower extremities.  Skin: no rashes, lesions, ulcers.  Neurologic: No gross focal neurologic deficit.  Able to move her extremities Psychiatric: Normal mood and affect.   Labs on Admission: I have personally reviewed following labs and imaging studies  CBC: Recent Labs  Lab 02/06/20 0931  WBC 6.5  NEUTROABS 4.4  HGB 13.6  HCT 39.3  MCV 90.8  PLT 0000000   Basic Metabolic Panel: Recent Labs  Lab 02/06/20 0931  NA 137  K 3.8  CL 101  CO2 25  GLUCOSE 112*  BUN 23*  CREATININE 0.80  CALCIUM 8.7*   GFR: Estimated Creatinine Clearance: 93.4 mL/min (by C-G formula based on SCr of 0.8 mg/dL). Liver Function Tests: Recent Labs  Lab 02/06/20 0931  AST 23  ALT 21  ALKPHOS 75  BILITOT 0.4  PROT 8.4*  ALBUMIN 4.2   No results for input(s): LIPASE, AMYLASE in the last 168 hours. No results for input(s): AMMONIA in the last 168 hours. Coagulation Profile: Recent Labs  Lab 02/06/20 0931  INR 1.0   Cardiac Enzymes: No results for input(s): CKTOTAL, CKMB, CKMBINDEX, TROPONINI in the last 168 hours. BNP (last 3 results) No results for input(s): PROBNP in the last 8760 hours. HbA1C: No results for input(s): HGBA1C in the last 72 hours. CBG: No results for input(s): GLUCAP in the last 168 hours. Lipid Profile: No results for input(s): CHOL, HDL, LDLCALC, TRIG, CHOLHDL, LDLDIRECT in the last 72 hours. Thyroid Function Tests: No results for input(s): TSH, T4TOTAL, FREET4, T3FREE, THYROIDAB in the last 72 hours. Anemia Panel: No results for input(s): VITAMINB12, FOLATE, FERRITIN, TIBC, IRON, RETICCTPCT in the last 72 hours. Urine analysis:    Component Value Date/Time   BILIRUBINUR neg 01/02/2016 1217   PROTEINUR neg 01/02/2016 1217   UROBILINOGEN 0.2 01/02/2016 1217   NITRITE neg 01/02/2016  1217   LEUKOCYTESUR Negative 01/02/2016 1217    Radiological Exams on Admission: CT HEAD WO CONTRAST  Result Date: 02/06/2020 CLINICAL DATA:  Sudden onset of right-sided weakness and numbness with facial droop beginning last night. Slurred speech. EXAM: CT HEAD WITHOUT CONTRAST TECHNIQUE: Contiguous axial images were obtained from the base of the skull through the vertex without intravenous contrast. COMPARISON:  None. FINDINGS: Brain: Focus of low-density in the right side of the pons that could represent a recent or old infarction. No focal cerebellar finding. Right cerebral hemisphere shows an old infarction in the basal ganglia and radiating white matter tracts. No sign of acute cortical infarction on the right. On the left, there is no CT evidence of old or acute infarction. There is some physiologic calcification in both basal ganglia regions.  No hydrocephalus, hemorrhage or extra-axial collection. Vascular: There is atherosclerotic calcification of the major vessels at the base of the brain. Skull: Negative Sinuses/Orbits: Clear/normal Other: None IMPRESSION: Indeterminate low-density in the right side of the pons that could be an old or recent pontine infarction. Old appearing infarction in the right basal ganglia and deep white matter tracts. No focal abnormality seen affecting the left hemisphere. There is atherosclerotic calcification of the major vessels at the base of the brain. Electronically Signed   By: Nelson Chimes M.D.   On: 02/06/2020 09:41    EKG: Independently reviewed.  Sinus rhythm with sinus arrhythmia LVH  Assessment/Plan Principal Problem:   CVA (cerebral vascular accident) ([place]) Active Problems:   S/P total thyroidectomy     CVA  (Acute versus subacute) Patient presents for evaluation of right-sided numbness and weakness with associated right facial droop. She had a CT scan of the head done without contrast which was negative for an acute bleed but showed  indeterminate low-density in the right side of the pons that could be an old or recent pontine infarction. Old appearing infarction in the right basal ganglia and deep white matter tracts. We will place patient on aspirin We will request speech therapy, physical therapy and occupational therapy consult Obtain MRI of the brain to rule out an acute infarct Obtain 2D echocardiogram to rule out cardiac thrombus Obtain carotid Doppler Allow for permissive hypertension Consult neurology     History of thyroid cancer status post total thyroidectomy Continue Synthroid    DVT prophylaxis: SCD Code Status: Full code  Family Communication: Greater than 50% of time was spent discussing patient's condition and plan of care with her husband at the bedside.  All questions and concerns have Disposition Plan: Back to previous home environment Consults called: Neurology    Collier Bullock MD [facility]     02/06/2020, 10:31 AM

## 2020-02-06 NOTE — Progress Notes (Signed)
*  PRELIMINARY RESULTS* Echocardiogram 2D Echocardiogram has been performed.  Sherrie Sport 02/06/2020, 1:27 PM

## 2020-02-06 NOTE — Consult Note (Signed)
Reason for Consult: R side weakness  Requesting Physician: Dr. Francine Graven  CC: R sided weakness   HPI: Leah Rogers is an 59 y.o. female  with medical history significant for arthritis, thyroid cancer who presents to the emergency room for evaluation of sudden onset right-sided weakness and numbness with associated facial droop.  She denies having any headaches, blurry vision, dizziness or lightheadedness.  She denies any difficulty swallowing.  Onset of symptoms about 1 AM and patient presented to the emergency room at about 9:15 AM. CT scan of the head without contrast shows indeterminate low-density in the right side of the pons that could be an old or recent pontine infarction. Old appearing infarction in the right basal ganglia and deep white matter tracts.   Past Medical History:  Diagnosis Date  . Arthritis   . Cancer ([place])    Thyroid  . Heart murmur   . Thyroid disease     Past Surgical History:  Procedure Laterality Date  . COLONOSCOPY WITH PROPOFOL N/A 03/20/2016   Procedure: COLONOSCOPY WITH PROPOFOL;  Surgeon: Lucilla Lame, MD;  Location: [place];  Service: Endoscopy;  Laterality: N/A;  . ENDOMETRIAL BIOPSY  2013   benign- post menopausal bleeding  . THYROIDECTOMY N/A 10/14/2015   Procedure: THYROIDECTOMY;  Surgeon: Margaretha Sheffield, MD;  Location: [facility];  Service: ENT;  Laterality: N/A;  . TUBAL LIGATION      Family History  Problem Relation Age of Onset  . Hypertension Mother   . Lung cancer Mother   . Heart attack Father   . Heart attack Brother   . Breast cancer Neg Hx     Social History:  reports that she quit smoking about 14 years ago. Her smoking use included cigarettes. She has a 9.00 pack-year smoking history. She has never used smokeless tobacco. She reports that she does not drink alcohol or use drugs.  No Known Allergies  Medications: I have reviewed the patient's current medications.  ROS: History obtained from the patient  General ROS:  negative for - chills, fatigue, fever, night sweats, weight gain or weight loss Psychological ROS: negative for - behavioral disorder, hallucinations, memory difficulties, mood swings or suicidal ideation Ophthalmic ROS: negative for - blurry vision, double vision, eye pain or loss of vision ENT ROS: negative for - epistaxis, nasal discharge, oral lesions, sore throat, tinnitus or vertigo Allergy and Immunology ROS: negative for - hives or itchy/watery eyes Hematological and Lymphatic ROS: negative for - bleeding problems, bruising or swollen lymph nodes Endocrine ROS: negative for - galactorrhea, hair pattern changes, polydipsia/polyuria or temperature intolerance Respiratory ROS: negative for - cough, hemoptysis, shortness of breath or wheezing Cardiovascular ROS: negative for - chest pain, dyspnea on exertion, edema or irregular heartbeat Gastrointestinal ROS: negative for - abdominal pain, diarrhea, hematemesis, nausea/vomiting or stool incontinence Genito-Urinary ROS: negative for - dysuria, hematuria, incontinence or urinary frequency/urgency Musculoskeletal ROS: negative for - joint swelling or muscular weakness Neurological ROS: as noted in HPI Dermatological ROS: negative for rash and skin lesion changes  Physical Examination: Blood pressure (!) 141/88, pulse 78, temperature 98.4 F (36.9 C), temperature source Oral, resp. rate 15, height 5\' 6"  (1.676 m), weight 108.9 kg, SpO2 100 %.  Neurological Examination   Mental Status: Alert, oriented Cranial Nerves: II: Discs flat bilaterally; Visual fields grossly normal, pupils equal, round, reactive to light and accommodation III,IV, VI: ptosis not present, extra-ocular motions intact bilaterally V,VII: smile symmetric, facial light touch sensation normal bilaterally VIII: hearing normal bilaterally  IX,X: gag reflex present XI: bilateral shoulder shrug XII: midline tongue extension Motor: Right : Upper extremity   4/5    Left:      Upper extremity   5/5  Lower extremity   5/5     Lower extremity   5/5 Tone and bulk:normal tone throughout; no atrophy noted Sensory: Pinprick and light touch intact throughout, bilaterally Deep Tendon Reflexes: 1+ and symmetric throughout Plantars: Right: downgoing   Left: downgoing Cerebellar: normal finger-to-nose    Laboratory Studies:   Basic Metabolic Panel: Recent Labs  Lab 02/06/20 0931  NA 137  K 3.8  CL 101  CO2 25  GLUCOSE 112*  BUN 23*  CREATININE 0.80  CALCIUM 8.7*    Liver Function Tests: Recent Labs  Lab 02/06/20 0931  AST 23  ALT 21  ALKPHOS 75  BILITOT 0.4  PROT 8.4*  ALBUMIN 4.2   No results for input(s): LIPASE, AMYLASE in the last 168 hours. No results for input(s): AMMONIA in the last 168 hours.  CBC: Recent Labs  Lab 02/06/20 0931  WBC 6.5  NEUTROABS 4.4  HGB 13.6  HCT 39.3  MCV 90.8  PLT 195    Cardiac Enzymes: No results for input(s): CKTOTAL, CKMB, CKMBINDEX, TROPONINI in the last 168 hours.  BNP: Invalid input(s): POCBNP  CBG: No results for input(s): GLUCAP in the last 168 hours.  Microbiology: Results for orders placed or performed during the hospital encounter of 02/06/20  SARS Coronavirus 2 by RT PCR (hospital order, performed in [facility] hospital lab) Nasopharyngeal Nasopharyngeal Swab     Status: None   Collection Time: 02/06/20 10:06 AM   Specimen: Nasopharyngeal Swab  Result Value Ref Range Status   SARS Coronavirus 2 NEGATIVE NEGATIVE Final    Comment: (NOTE) SARS-CoV-2 target nucleic acids are NOT DETECTED. The SARS-CoV-2 RNA is generally detectable in upper and lower respiratory specimens during the acute phase of infection. The lowest concentration of SARS-CoV-2 viral copies this assay can detect is 250 copies / mL. A negative result does not preclude SARS-CoV-2 infection and should not be used as the sole basis for treatment or other patient management decisions.  A negative result may occur  with improper specimen collection / handling, submission of specimen other than nasopharyngeal swab, presence of viral mutation(s) within the areas targeted by this assay, and inadequate number of viral copies (<250 copies / mL). A negative result must be combined with clinical observations, patient history, and epidemiological information. Fact Sheet for Patients:   StrictlyIdeas.no Fact Sheet for Healthcare Providers: BankingDealers.co.za This test is not yet approved or cleared  by the [place] FDA and has been authorized for detection and/or diagnosis of SARS-CoV-2 by FDA under an Emergency Use Authorization (EUA).  This EUA will remain in effect (meaning this test can be used) for the duration of the COVID-19 declaration under Section 564(b)(1) of the Act, 21 U.S.C. section 360bbb-3(b)(1), unless the authorization is terminated or revoked sooner. Performed at [facility], [address] [place], [place] [address]     Coagulation Studies: Recent Labs    02/06/20 0931  LABPROT 12.4  INR 1.0    Urinalysis: No results for input(s): COLORURINE, LABSPEC, PHURINE, GLUCOSEU, HGBUR, BILIRUBINUR, KETONESUR, PROTEINUR, UROBILINOGEN, NITRITE, LEUKOCYTESUR in the last 168 hours.  Invalid input(s): APPERANCEUR  Lipid Panel:     Component Value Date/Time   CHOL 191 01/02/2016 1131   TRIG 75 01/02/2016 1131   HDL 58 01/02/2016 1131   CHOLHDL 3.3 01/02/2016  1131   LDLCALC 118 (H) 01/02/2016 1131    HgbA1C: No results found for: HGBA1C  Urine Drug Screen:  No results found for: LABOPIA, COCAINSCRNUR, LABBENZ, AMPHETMU, THCU, LABBARB  Alcohol Level: No results for input(s): ETH in the last 168 hours.  Other results: EKG: normal EKG, normal sinus rhythm, unchanged from previous tracings.  Imaging: CT HEAD WO CONTRAST  Result Date: 02/06/2020 CLINICAL DATA:  Sudden onset of right-sided weakness and numbness with  facial droop beginning last night. Slurred speech. EXAM: CT HEAD WITHOUT CONTRAST TECHNIQUE: Contiguous axial images were obtained from the base of the skull through the vertex without intravenous contrast. COMPARISON:  None. FINDINGS: Brain: Focus of low-density in the right side of the pons that could represent a recent or old infarction. No focal cerebellar finding. Right cerebral hemisphere shows an old infarction in the basal ganglia and radiating white matter tracts. No sign of acute cortical infarction on the right. On the left, there is no CT evidence of old or acute infarction. There is some physiologic calcification in both basal ganglia regions. No hydrocephalus, hemorrhage or extra-axial collection. Vascular: There is atherosclerotic calcification of the major vessels at the base of the brain. Skull: Negative Sinuses/Orbits: Clear/normal Other: None IMPRESSION: Indeterminate low-density in the right side of the pons that could be an old or recent pontine infarction. Old appearing infarction in the right basal ganglia and deep white matter tracts. No focal abnormality seen affecting the left hemisphere. There is atherosclerotic calcification of the major vessels at the base of the brain. Electronically Signed   By: Nelson Chimes M.D.   On: 02/06/2020 09:41     Assessment/Plan:  59 y.o. female  with medical history significant for arthritis, thyroid cancer who presents to the emergency room for evaluation of sudden onset right-sided weakness and numbness with associated facial droop.  She denies having any headaches, blurry vision, dizziness or lightheadedness.  She denies any difficulty swallowing.  Onset of symptoms about 1 AM and patient presented to the emergency room at about 9:15 AM. CT scan of the head without contrast shows indeterminate low-density in the right side of the pons that could be an old or recent pontine infarction. Old appearing infarction in the right basal ganglia and deep  white matter tracts.  - CTA pending of head - stroke work up with MRI brian  - on ASA 325 - pt/ ot  - 02/06/2020, 1:47 PM

## 2020-02-06 NOTE — Evaluation (Signed)
Physical Therapy Evaluation Patient Details Name: Leah Rogers MRN: [id number] DOB: May 19, 1961 Today's Date: 02/06/2020   History of Present Illness  Leah Rogers is a 57yoF who [facility] c acute onset facial droop, RUE weakness. Pt had Lt TKA in October with good outcome, was back to wrok. PTA pt wa s afully independent community dwelling adult.  Clinical Impression  Pt admitted with above diagnosis. Pt currently with functional limitations due to the deficits listed below (see "PT Problem List"). Upon entry, pt in bed, awake and agreeable to participate. The pt is somewhat drowsy, but oriented x4, pleasant, conversational, and generally a good historian, many questions are directed to her husband. Examination revealing of ataxia of the RUE, mild weakness (~25%), intact sensation. RLE appears to be largely uninvolved. Pt has dysarthria and coughs frequently in session. Line bisection test is normal. Mobility in general is modified independent, but appears to be the victim of generalized fatigue and mild drowsiness. Balance and stability of gait are near baseline. Functional mobility assessment demonstrates increased effort/time requirements, poor tolerance, and need for physical assistance, whereas the patient performed these at a higher level of independence PTA. Pt will benefit from skilled PT intervention to increase independence and safety with basic mobility in preparation for discharge to the venue listed below.       Follow Up Recommendations Supervision for mobility/OOB;No PT follow up    Equipment Recommendations  None recommended by PT    Recommendations for Other Services       Precautions / Restrictions Precautions Precautions: None Restrictions Weight Bearing Restrictions: No      Mobility  Bed Mobility Overal bed mobility: Needs Assistance Bed Mobility: Supine to Sit;Sit to Supine     Supine to sit: Modified independent (Device/Increase time) Sit to  supine: Supervision   General bed mobility comments: difficulty with repositioning, but likely at baseline/generalized fatigue  Transfers Overall transfer level: Needs assistance Equipment used: None Transfers: Sit to/from Stand Sit to Stand: From elevated surface         General transfer comment: No LOB or abnormality  Ambulation/Gait Ambulation/Gait assistance: Min guard Gait Distance (Feet): 60 Feet Assistive device: None Gait Pattern/deviations: WFL(Within Functional Limits)     General Gait Details: no acute abnormality, no asymetry, no LOB  Stairs            Wheelchair Mobility    Modified Rankin (Stroke Patients Only)       Balance Overall balance assessment: Mild deficits observed, not formally tested;Independent                                           Pertinent Vitals/Pain Pain Assessment: No/denies pain    Home Living Family/patient expects to be discharged to:: Private residence Living Arrangements: Children;Spouse/significant other(22yoSon and Husband both work 1st shift) Available Help at Discharge: Family Type of Home: Mobile home Home Access: Stairs to enter Entrance Stairs-Rails: None Entrance Stairs-Number of Steps: 2 Home Layout: One level Home Equipment: Environmental consultant - 2 wheels;Kasandra Knudsen - single point Additional Comments: from TKA in October c Dr. 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 RUE Sensation: WNL RUE Coordination: decreased gross motor;decreased fine motor    Lower Extremity Assessment Lower  Extremity Assessment: Overall WFL for tasks assessed;RLE deficits/detail RLE Deficits / Details: strong RLE Sensation: WNL RLE Coordination: WNL    Cervical / Trunk Assessment Cervical / Trunk Assessment: Normal  Communication      Cognition Arousal/Alertness: Awake/alert Behavior During Therapy: WFL for tasks assessed/performed Overall Cognitive Status: Within Functional Limits for tasks assessed                                        General Comments      Exercises     Assessment/Plan    PT Assessment Patient needs continued PT services  PT Problem List Decreased strength;Decreased coordination;Decreased knowledge of precautions       PT Treatment Interventions DME instruction;Therapeutic exercise;Gait training;Stair training;Functional mobility training;Therapeutic activities;Patient/family education    PT Goals (Current goals can be found in the Care Plan section)  Acute Rehab PT Goals Patient Stated Goal: regain RUE strength PT Goal Formulation: With patient Time For Goal Achievement: 02/20/20 Potential to Achieve Goals: Good    Frequency 7X/week   Barriers to discharge        Co-evaluation               AM-PAC PT "6 Clicks" Mobility  Outcome Measure Help needed turning from your back to your side while in a flat bed without using bedrails?: A Little Help needed moving from lying on your back to sitting on the side of a flat bed without using bedrails?: A Little Help needed moving to and from a bed to a chair (including a wheelchair)?: A Little Help needed standing up from a chair using your arms (e.g., wheelchair or bedside chair)?: A Little Help needed to walk in hospital room?: A Little Help needed climbing 3-5 steps with a railing? : A Little 6 Click Score: 18    End of Session Equipment Utilized During Treatment: Gait belt Activity Tolerance: Patient tolerated treatment well;No increased pain Patient left: in bed;with  family/visitor present;with call bell/phone within reach Nurse Communication: Mobility status PT Visit Diagnosis: Muscle weakness (generalized) (M62.81);Other symptoms and signs involving the nervous system (R29.898);Hemiplegia and hemiparesis Hemiplegia - Right/Left: Right Hemiplegia - dominant/non-dominant: Dominant Hemiplegia - caused by: Cerebral infarction    Time: 1540-1602 PT Time Calculation (min) (ACUTE ONLY): 22 min   Charges:   PT Evaluation $PT Eval Low Complexity: 1 Low          5:25 PM, 02/06/20 Etta Grandchild, PT, DPT Physical Therapist - [facility]  [phone] ([place])    [place] C 02/06/2020, 5:14 PM

## 2020-02-06 NOTE — ED Notes (Signed)
Assigned @ 1705, spoke with RN Lorrie.

## 2020-02-06 NOTE — ED Provider Notes (Signed)
[facility] Emergency Department Provider Note       Time seen: ----------------------------------------- 9:33 AM on 02/06/2020 -----------------------------------------   I have reviewed the vital signs and the nursing notes.  HISTORY   Chief Complaint Weakness and Facial Droop    HPI Leah Rogers is a 59 y.o. female with a history of arthritis, thyroid cancer, heart murmur, thyroid disease who presents today for sudden onset right-sided weakness with numbness and facial droop.  Denies any other symptoms.  Denies any difficulty swallowing or eating since it started.  She has no pain.  Past Medical History:  Diagnosis Date  . Arthritis   . Cancer ([place])    Thyroid  . Heart murmur   . Thyroid disease     Past Surgical History:  Procedure Laterality Date  . COLONOSCOPY WITH PROPOFOL N/A 03/20/2016   Procedure: COLONOSCOPY WITH PROPOFOL;  Surgeon: Lucilla Lame, MD;  Location: [place];  Service: Endoscopy;  Laterality: N/A;  . ENDOMETRIAL BIOPSY  2013   benign- post menopausal bleeding  . THYROIDECTOMY N/A 10/14/2015   Procedure: THYROIDECTOMY;  Surgeon: Margaretha Sheffield, MD;  Location: [facility];  Service: ENT;  Laterality: N/A;  . TUBAL LIGATION      Allergies Patient has no known allergies.   Review of Systems Constitutional: Negative for fever. Cardiovascular: Negative for chest pain. Respiratory: Negative for shortness of breath. Gastrointestinal: Negative for abdominal pain, vomiting and diarrhea. Musculoskeletal: Negative for back pain. Skin: Negative for rash. Neurological: Positive for numbness, weakness and facial droop  All systems negative/normal/unremarkable except as stated in the HPI  ____________________________________________   PHYSICAL EXAM:  VITAL SIGNS: ED Triage Vitals  Enc Vitals Group     BP 02/06/20 0917 (!) 139/92     Pulse Rate 02/06/20 0917 80     Resp 02/06/20 0917 18     Temp 02/06/20 0917 98.4 F (36.9 C)    Temp Source 02/06/20 0917 Oral     SpO2 02/06/20 0917 98 %     Weight 02/06/20 0919 240 lb (108.9 kg)     Height 02/06/20 0919 5\' 6"  (1.676 m)     Head Circumference --      Peak Flow --      Pain Score 02/06/20 0917 0     Pain Loc --      Pain Edu? --      Excl. in [place]? --     Constitutional: Alert and oriented. Well appearing and in no distress. Eyes: Conjunctivae are normal. Normal extraocular movements. ENT      Head: Normocephalic and atraumatic.      Nose: No congestion/rhinnorhea.      Mouth/Throat: Mucous membranes are moist.      Neck: No stridor. Cardiovascular: Normal rate, regular rhythm. No murmurs, rubs, or gallops. Respiratory: Normal respiratory effort without tachypnea nor retractions. Breath sounds are clear and equal bilaterally. No wheezes/rales/rhonchi. Gastrointestinal: Soft and nontender. Normal bowel sounds Musculoskeletal: Nontender with normal range of motion in extremities. No lower extremity tenderness nor edema. Neurologic: Right-sided facial droop as well as right arm and right leg weakness.  No obvious sensory deficits.  Tongue deviates to the right side. Skin:  Skin is warm, dry and intact. No rash noted. Psychiatric: Speech and behavior are normal.  ____________________________________________  EKG: Interpreted by me.  Sinus rhythm with sinus arrhythmia, rate 73 bpm, LVH, normal QT  ____________________________________________   LABS (pertinent positives/negatives)  Labs Reviewed  CBC  DIFFERENTIAL  PROTIME-INR  APTT  COMPREHENSIVE  METABOLIC PANEL  CBG MONITORING, ED   CRITICAL CARE Performed by: Laurence Aly   Total critical care time: 30 minutes  Critical care time was exclusive of separately billable procedures and treating other patients.  Critical care was necessary to treat or prevent imminent or life-threatening deterioration.  Critical care was time spent personally by me on the following activities: development of  treatment plan with patient and/or surrogate as well as nursing, discussions with consultants, evaluation of patient's response to treatment, examination of patient, obtaining history from patient or surrogate, ordering and performing treatments and interventions, ordering and review of laboratory studies, ordering and review of radiographic studies, pulse oximetry and re-evaluation of patient's condition.  RADIOLOGY  Images were viewed by me CT head IMPRESSION: Indeterminate low-density in the right side of the pons that could be an old or recent pontine infarction.  Old appearing infarction in the right basal ganglia and deep white matter tracts.  No focal abnormality seen affecting the left hemisphere.  There is atherosclerotic calcification of the major vessels at the base of the brain.  DIFFERENTIAL DIAGNOSIS  CVA, TIA, Bell's palsy, dehydration, occult infection  ASSESSMENT AND PLAN  CVA   Plan: The patient had presented for strokelike symptoms. Patient's labs to this point have been unremarkable. Patient's imaging did reveal indeterminate low-density lesion in the right side of the pons.  There are also old appearing infarcts in the right basal ganglia and deep white matter.  Patient will be given full dose aspirin.  I will discuss with neurology.  I will discuss with hospitalist for admission.  Lenise Arena MD    Note: This note was generated in part or whole with voice recognition software. Voice recognition is usually quite accurate but there are transcription errors that can and very often do occur. I apologize for any typographical errors that were not detected and corrected.     Earleen Newport, MD 02/06/20 [phone]

## 2020-02-07 ENCOUNTER — Encounter: Payer: Self-pay | Admitting: Internal Medicine

## 2020-02-07 DIAGNOSIS — I639 Cerebral infarction, unspecified: Secondary | ICD-10-CM | POA: Diagnosis not present

## 2020-02-07 DIAGNOSIS — E890 Postprocedural hypothyroidism: Secondary | ICD-10-CM | POA: Diagnosis not present

## 2020-02-07 LAB — LIPID PANEL
Cholesterol: 203 mg/dL — ABNORMAL HIGH (ref 0–200)
HDL: 52 mg/dL (ref >40)
LDL Cholesterol: 141 mg/dL — ABNORMAL HIGH (ref 0–99)
Total CHOL/HDL Ratio: 3.9 RATIO
Triglycerides: 51 mg/dL (ref <150)
VLDL: 10 mg/dL (ref 0–40)

## 2020-02-07 LAB — HIV ANTIBODY (ROUTINE TESTING W REFLEX): HIV Screen 4th Generation wRfx: NONREACTIVE

## 2020-02-07 LAB — HEMOGLOBIN A1C
Hgb A1c MFr Bld: 5.8 % — ABNORMAL HIGH (ref 4.8–5.6)
Mean Plasma Glucose: 120 mg/dL

## 2020-02-07 MED ORDER — ENOXAPARIN SODIUM 40 MG/0.4ML ~~LOC~~ SOLN
40.0000 mg | SUBCUTANEOUS | Status: DC
  Administered 2020-02-07 – 2020-02-08 (×2): 40 mg via SUBCUTANEOUS
  Filled 2020-02-07 (×2): qty 0.4

## 2020-02-07 MED ORDER — ASPIRIN 325 MG PO TABS
325.0000 mg | ORAL_TABLET | Freq: Every day | ORAL | Status: DC
  Administered 2020-02-07 – 2020-02-08 (×2): 325 mg via ORAL
  Filled 2020-02-07 (×2): qty 1

## 2020-02-07 MED ORDER — ATORVASTATIN CALCIUM 20 MG PO TABS
80.0000 mg | ORAL_TABLET | Freq: Every day | ORAL | Status: DC
  Administered 2020-02-07 – 2020-02-08 (×2): 80 mg via ORAL
  Filled 2020-02-07 (×2): qty 4

## 2020-02-07 MED ORDER — FERROUS SULFATE 325 (65 FE) MG PO TABS
325.0000 mg | ORAL_TABLET | Freq: Every day | ORAL | Status: DC
  Administered 2020-02-08: 325 mg via ORAL
  Filled 2020-02-07: qty 1

## 2020-02-07 NOTE — Progress Notes (Signed)
Progress Note    Leah Rogers  [id number] DOB: 1961-09-15  DOA: 02/06/2020 PCP: Glean Hess, MD      Brief Narrative:    Medical records reviewed and are as summarized below:  Leah Rogers is an 59 y.o. female female with medical history significant for arthritis, thyroid cancer who presented to the emergency room for evaluation of sudden onset right-sided weakness and numbness with associated right facial droop. Work-up revealed acute left basal ganglia infarct.     Assessment/Plan:   Principal Problem:   CVA (cerebral vascular accident) ([place]) Active Problems:   S/P total thyroidectomy   Acute CVA (cerebrovascular accident) ([place])   Acute left basal ganglia infarct/stroke/chronic right basal ganglia infarct: Continue aspirin. Start Lipitor.  Continue PT, OT and ST.  No large vessel occlusion on CTA head and neck.  2D echo showed EF estimated at 50 to [id number] and LV diastolic parameters were normal.  Follow-up with neurologist.  Discontinue IV fluids.  Dysphagia: Speech therapist recommended dysphagia 2 diet and nectar thick liquids.  Incidental finding of 2 mm anterior communicating artery aneurysm: Outpatient follow-up with neurologist.   Body mass index is 38.74 kg/m.  (Morbid obesity)   Family Communication/Anticipated D/C date and plan/Code Status   DVT prophylaxis: Lovenox Code Status: Full code Family Communication: Plan discussed with patient Disposition Plan:    Status is: Inpatient  Remains inpatient appropriate because:Inpatient level of care appropriate due to severity of illness   Dispo: The patient is from: Home              Anticipated d/c is to: Home              Anticipated d/c date is: 1 day              Patient currently is not medically stable to d/c.            Subjective:   No complaints.  She feels better today.  Her swallowing is better and weakness in right upper extremity has improved.  Objective:     Vitals:   02/07/20 0021 02/07/20 0219 02/07/20 0408 02/07/20 0616  BP: 134/86 128/88 122/79 130/86  Pulse: (!) 55 (!) 51 (!) 59   Resp: 18 10    Temp: 98 F (36.7 C)  (!) 97.3 F (36.3 C) 98.1 F (36.7 C)  TempSrc: Oral  Oral Oral  SpO2: 99% 98% 99% 96%  Weight:      Height:       No data found.   Intake/Output Summary (Last 24 hours) at 02/07/2020 1228 Last data filed at 02/07/2020 0404 Gross per 24 hour  Intake 744.95 ml  Output 825 ml  Net -80.05 ml   Filed Weights   02/06/20 0919  Weight: 108.9 kg    Exam:  GEN: NAD SKIN: No rash EYES: EOMI ENT: MMM CV: RRR PULM: CTA B ABD: soft, obese, NT, +BS CNS: AAO x 3, slurred speech, right facial droop, power is 4+/5 in the right upper extremity.  No other focal findings. EXT: No edema or tenderness   Data Reviewed:   I have personally reviewed following labs and imaging studies:  Labs: Labs show the following:   Basic Metabolic Panel: Recent Labs  Lab 02/06/20 0931  NA 137  K 3.8  CL 101  CO2 25  GLUCOSE 112*  BUN 23*  CREATININE 0.80  CALCIUM 8.7*   GFR Estimated Creatinine Clearance: 93.4 mL/min (by C-G formula based  on SCr of 0.8 mg/dL). Liver Function Tests: Recent Labs  Lab 02/06/20 0931  AST 23  ALT 21  ALKPHOS 75  BILITOT 0.4  PROT 8.4*  ALBUMIN 4.2   No results for input(s): LIPASE, AMYLASE in the last 168 hours. No results for input(s): AMMONIA in the last 168 hours. Coagulation profile Recent Labs  Lab 02/06/20 0931  INR 1.0    CBC: Recent Labs  Lab 02/06/20 0931  WBC 6.5  NEUTROABS 4.4  HGB 13.6  HCT 39.3  MCV 90.8  PLT 195   Cardiac Enzymes: No results for input(s): CKTOTAL, CKMB, CKMBINDEX, TROPONINI in the last 168 hours. BNP (last 3 results) No results for input(s): PROBNP in the last 8760 hours. CBG: Recent Labs  Lab 02/06/20 0919  GLUCAP 94   D-Dimer: No results for input(s): DDIMER in the last 72 hours. Hgb A1c: No results for input(s): HGBA1C  in the last 72 hours. Lipid Profile: Recent Labs    02/07/20 0504  CHOL 203*  HDL 52  LDLCALC 141*  TRIG 51  CHOLHDL 3.9   Thyroid function studies: No results for input(s): TSH, T4TOTAL, T3FREE, THYROIDAB in the last 72 hours.  Invalid input(s): FREET3 Anemia work up: No results for input(s): VITAMINB12, FOLATE, FERRITIN, TIBC, IRON, RETICCTPCT in the last 72 hours. Sepsis Labs: Recent Labs  Lab 02/06/20 0931  WBC 6.5    Microbiology Recent Results (from the past 240 hour(s))  SARS Coronavirus 2 by RT PCR (hospital order, performed in [facility] hospital lab) Nasopharyngeal Nasopharyngeal Swab     Status: None   Collection Time: 02/06/20 10:06 AM   Specimen: Nasopharyngeal Swab  Result Value Ref Range Status   SARS Coronavirus 2 NEGATIVE NEGATIVE Final    Comment: (NOTE) SARS-CoV-2 target nucleic acids are NOT DETECTED. The SARS-CoV-2 RNA is generally detectable in upper and lower respiratory specimens during the acute phase of infection. The lowest concentration of SARS-CoV-2 viral copies this assay can detect is 250 copies / mL. A negative result does not preclude SARS-CoV-2 infection and should not be used as the sole basis for treatment or other patient management decisions.  A negative result may occur with improper specimen collection / handling, submission of specimen other than nasopharyngeal swab, presence of viral mutation(s) within the areas targeted by this assay, and inadequate number of viral copies (<250 copies / mL). A negative result must be combined with clinical observations, patient history, and epidemiological information. Fact Sheet for Patients:   StrictlyIdeas.no Fact Sheet for Healthcare Providers: BankingDealers.co.za This test is not yet approved or cleared  by the [place] FDA and has been authorized for detection and/or diagnosis of SARS-CoV-2 by FDA under an Emergency Use Authorization  (EUA).  This EUA will remain in effect (meaning this test can be used) for the duration of the COVID-19 declaration under Section 564(b)(1) of the Act, 21 U.S.C. section 360bbb-3(b)(1), unless the authorization is terminated or revoked sooner. Performed at [facility], [address] [place], [place] [address]     Procedures and diagnostic studies:  CT ANGIO HEAD W OR WO CONTRAST  Result Date: 02/06/2020 CLINICAL DATA:  Neuro deficit, acute, stroke suspected. Additional history provided: Sudden onset right-sided weakness with numbness. EXAM: CT ANGIOGRAPHY HEAD AND NECK TECHNIQUE: Multidetector CT imaging of the head and neck was performed using the standard protocol during bolus administration of intravenous contrast. Multiplanar CT image reconstructions and MIPs were obtained to evaluate the vascular anatomy. Carotid stenosis measurements (when applicable) are  obtained utilizing NASCET criteria, using the distal internal carotid diameter as the denominator. CONTRAST:  65mL OMNIPAQUE IOHEXOL 350 MG/ML SOLN COMPARISON:  Noncontrast head CT performed earlier the same day 02/06/2020. FINDINGS: CT HEAD FINDINGS Brain: Again demonstrated is a subcentimeter focus of low density within the paramedian right pons (series 3, image 9). Redemonstrated chronic lacunar infarct within the right basal ganglia and radiating white matter tracts. Unchanged bilateral basal ganglia mineralization. Cerebral volume is normal for age. There is no acute intracranial hemorrhage. No demarcated cortical infarct is identified. No extra-axial fluid collection. No evidence of intracranial mass. No midline shift. Vascular: Reported below. Skull: Normal. Negative for fracture or focal lesion. Sinuses: No significant paranasal sinus disease or mastoid effusion. Orbits: No acute abnormality. Review of the MIP images confirms the above findings CTA NECK FINDINGS Aortic arch: Standard aortic branching. No hemodynamically  significant innominate or proximal subclavian artery stenosis. Right carotid system: CCA and ICA patent within the neck without measurable stenosis. Minimal mixed plaque at the carotid bifurcation. Left carotid system: CCA and ICA patent within the neck without measurable stenosis. No significant atherosclerotic disease. Vertebral arteries: Codominant. The right vertebral arteries patent within the neck without significant stenosis. Mild atherosclerotic narrowing at the origin of the left vertebral artery. Skeleton: No acute bony abnormality or aggressive appearing osseous lesion. Cervical spondylosis. Most notably at C5-C6 there is moderate/advanced disc space narrowing with a posterior disc osteophyte complex and uncovertebral hypertrophy. Other neck: Prior thyroidectomy.  No cervical lymphadenopathy. Upper chest: No consolidation within the imaged lung apices. Review of the MIP images confirms the above findings CTA HEAD FINDINGS Anterior circulation: The intracranial internal carotid arteries are patent. Mild calcified plaque within these vessels without significant stenosis. The M1 middle cerebral arteries are patent. Minimal atherosclerotic narrowing of the proximal left M1 segment (series 14, image 50). No M2 proximal branch occlusion or high-grade proximal stenosis is identified. The anterior cerebral arteries are patent without significant proximal stenosis. 2 mm inferiorly projecting vascular protrusion arising from the region of the anterior communicating artery consistent with small aneurysm (series 16, image 23). Posterior circulation: The intracranial vertebral arteries are patent without significant stenosis, as is the basilar artery. The posterior cerebral arteries are patent proximally without significant stenosis. Posterior communicating arteries are hypoplastic or absent bilaterally. Venous sinuses: Within limitations of contrast timing, no convincing thrombus. Anatomic variants: As described.  Review of the MIP images confirms the above findings IMPRESSION: CT head: 1. Unchanged appearance of an indeterminate subcentimeter low-density focus within the paramedian right pons which could reflect an old or recent pontine infarction. 2. No acute demarcated cortical infarct is identified. 3. Redemonstrated chronic infarct within the right basal ganglia and radiating white matter tracks. CTA neck: 1. The common and internal carotid arteries are patent within the neck without measurable stenosis. Minimal mixed plaque within the right carotid bifurcation. 2. The vertebral arteries are patent within the neck. Mild atherosclerotic narrowing at the origin of the left vertebral artery. CTA head: 1. No intracranial large vessel occlusion or proximal high-grade arterial stenosis. 2. Mild atherosclerotic narrowing of the M1 left middle cerebral artery. 3. 2 mm anterior communicating artery aneurysm. Electronically Signed   By: Kellie Simmering DO   On: 02/06/2020 12:25   CT HEAD WO CONTRAST  Result Date: 02/06/2020 CLINICAL DATA:  Sudden onset of right-sided weakness and numbness with facial droop beginning last night. Slurred speech. EXAM: CT HEAD WITHOUT CONTRAST TECHNIQUE: Contiguous axial images were obtained from the base of the skull  through the vertex without intravenous contrast. COMPARISON:  None. FINDINGS: Brain: Focus of low-density in the right side of the pons that could represent a recent or old infarction. No focal cerebellar finding. Right cerebral hemisphere shows an old infarction in the basal ganglia and radiating white matter tracts. No sign of acute cortical infarction on the right. On the left, there is no CT evidence of old or acute infarction. There is some physiologic calcification in both basal ganglia regions. No hydrocephalus, hemorrhage or extra-axial collection. Vascular: There is atherosclerotic calcification of the major vessels at the base of the brain. Skull: Negative Sinuses/Orbits:  Clear/normal Other: None IMPRESSION: Indeterminate low-density in the right side of the pons that could be an old or recent pontine infarction. Old appearing infarction in the right basal ganglia and deep white matter tracts. No focal abnormality seen affecting the left hemisphere. There is atherosclerotic calcification of the major vessels at the base of the brain. Electronically Signed   By: Nelson Chimes M.D.   On: 02/06/2020 09:41   CT ANGIO NECK W OR WO CONTRAST  Result Date: 02/06/2020 CLINICAL DATA:  Neuro deficit, acute, stroke suspected. Additional history provided: Sudden onset right-sided weakness with numbness. EXAM: CT ANGIOGRAPHY HEAD AND NECK TECHNIQUE: Multidetector CT imaging of the head and neck was performed using the standard protocol during bolus administration of intravenous contrast. Multiplanar CT image reconstructions and MIPs were obtained to evaluate the vascular anatomy. Carotid stenosis measurements (when applicable) are obtained utilizing NASCET criteria, using the distal internal carotid diameter as the denominator. CONTRAST:  82mL OMNIPAQUE IOHEXOL 350 MG/ML SOLN COMPARISON:  Noncontrast head CT performed earlier the same day 02/06/2020. FINDINGS: CT HEAD FINDINGS Brain: Again demonstrated is a subcentimeter focus of low density within the paramedian right pons (series 3, image 9). Redemonstrated chronic lacunar infarct within the right basal ganglia and radiating white matter tracts. Unchanged bilateral basal ganglia mineralization. Cerebral volume is normal for age. There is no acute intracranial hemorrhage. No demarcated cortical infarct is identified. No extra-axial fluid collection. No evidence of intracranial mass. No midline shift. Vascular: Reported below. Skull: Normal. Negative for fracture or focal lesion. Sinuses: No significant paranasal sinus disease or mastoid effusion. Orbits: No acute abnormality. Review of the MIP images confirms the above findings CTA NECK  FINDINGS Aortic arch: Standard aortic branching. No hemodynamically significant innominate or proximal subclavian artery stenosis. Right carotid system: CCA and ICA patent within the neck without measurable stenosis. Minimal mixed plaque at the carotid bifurcation. Left carotid system: CCA and ICA patent within the neck without measurable stenosis. No significant atherosclerotic disease. Vertebral arteries: Codominant. The right vertebral arteries patent within the neck without significant stenosis. Mild atherosclerotic narrowing at the origin of the left vertebral artery. Skeleton: No acute bony abnormality or aggressive appearing osseous lesion. Cervical spondylosis. Most notably at C5-C6 there is moderate/advanced disc space narrowing with a posterior disc osteophyte complex and uncovertebral hypertrophy. Other neck: Prior thyroidectomy.  No cervical lymphadenopathy. Upper chest: No consolidation within the imaged lung apices. Review of the MIP images confirms the above findings CTA HEAD FINDINGS Anterior circulation: The intracranial internal carotid arteries are patent. Mild calcified plaque within these vessels without significant stenosis. The M1 middle cerebral arteries are patent. Minimal atherosclerotic narrowing of the proximal left M1 segment (series 14, image 50). No M2 proximal branch occlusion or high-grade proximal stenosis is identified. The anterior cerebral arteries are patent without significant proximal stenosis. 2 mm inferiorly projecting vascular protrusion arising from the region of  the anterior communicating artery consistent with small aneurysm (series 16, image 23). Posterior circulation: The intracranial vertebral arteries are patent without significant stenosis, as is the basilar artery. The posterior cerebral arteries are patent proximally without significant stenosis. Posterior communicating arteries are hypoplastic or absent bilaterally. Venous sinuses: Within limitations of contrast  timing, no convincing thrombus. Anatomic variants: As described. Review of the MIP images confirms the above findings IMPRESSION: CT head: 1. Unchanged appearance of an indeterminate subcentimeter low-density focus within the paramedian right pons which could reflect an old or recent pontine infarction. 2. No acute demarcated cortical infarct is identified. 3. Redemonstrated chronic infarct within the right basal ganglia and radiating white matter tracks. CTA neck: 1. The common and internal carotid arteries are patent within the neck without measurable stenosis. Minimal mixed plaque within the right carotid bifurcation. 2. The vertebral arteries are patent within the neck. Mild atherosclerotic narrowing at the origin of the left vertebral artery. CTA head: 1. No intracranial large vessel occlusion or proximal high-grade arterial stenosis. 2. Mild atherosclerotic narrowing of the M1 left middle cerebral artery. 3. 2 mm anterior communicating artery aneurysm. Electronically Signed   By: Kellie Simmering DO   On: 02/06/2020 12:25   MR BRAIN WO CONTRAST  Result Date: 02/06/2020 CLINICAL DATA:  Sudden onset right side weakness and facial droop EXAM: MRI HEAD WITHOUT CONTRAST TECHNIQUE: Multiplanar, multiecho pulse sequences of the brain and surrounding structures were obtained without intravenous contrast. COMPARISON:  Correlation made with same day CT imaging FINDINGS: Brain: There is mildly reduced diffusion spanning the body of the left caudate, corona radiata, and putamen. Chronic infarct of the right basal ganglia and adjacent white matter with chronic blood products. Chronic infarct of the parasagittal right pons. Additional patchy T2 hyperintensity in the supratentorial white matter is nonspecific but may reflect mild chronic microvascular ischemic changes. Ventricles and sulci are normal in size and configuration. There is no intracranial mass, mass effect, or edema. There is no hydrocephalus or extra-axial  fluid collection. Vascular: Major vessel flow voids at the skull base are preserved. Skull and upper cervical spine: Normal marrow signal is preserved. Sinuses/Orbits: Paranasal sinuses are aerated. Orbits are unremarkable. Other: Sella is unremarkable.  Mastoid air cells are clear. IMPRESSION: Acute small vessel infarct involving the left basal ganglia and adjacent white matter. Chronic infarcts and chronic microvascular ischemic changes as detailed above. Electronically Signed   By: Macy Mis M.D.   On: 02/06/2020 14:08   ECHOCARDIOGRAM COMPLETE  Result Date: 02/06/2020    ECHOCARDIOGRAM REPORT   Patient Name:   KRISTIANE BHANDARI Date of Exam: 02/06/2020 Medical Rec #:  [id number]       Height:       66.0 in Accession #:    [id number]      Weight:       240.0 lb Date of Birth:  May 27, 1961       BSA:          2.161 m Patient Age:    68 years        BP:           141/88 mmHg Patient Gender: F               HR:           78 bpm. Exam Location:  [facility] Procedure: 2D Echo, Color Doppler and Cardiac Doppler Indications:     Stroke 434.91  History:         Patient has no  prior history of Echocardiogram examinations.                  Signs/Symptoms:Murmur.  Sonographer:     Sherrie Sport RDCS (AE) Referring Phys:  [id number] Collier Bullock Diagnosing Phys: Serafina Royals MD  Sonographer Comments: No parasternal window, no subcostal window and Technically challenging study due to limited acoustic windows. IMPRESSIONS  1. Left ventricular ejection fraction, by estimation, is 50 to 55%. The left ventricle has low normal function. The left ventricle has no regional wall motion abnormalities. Left ventricular diastolic parameters were normal.  2. Right ventricular systolic function is normal. The right ventricular size is normal. There is normal pulmonary artery systolic pressure.  3. The mitral valve is normal in structure. Mild mitral valve regurgitation.  4. The aortic valve is normal in structure. Aortic valve regurgitation  is not visualized. FINDINGS  Left Ventricle: Left ventricular ejection fraction, by estimation, is 50 to 55%. The left ventricle has low normal function. The left ventricle has no regional wall motion abnormalities. The left ventricular internal cavity size was normal in size. There is no left ventricular hypertrophy. Left ventricular diastolic parameters were normal. Right Ventricle: The right ventricular size is normal. No increase in right ventricular wall thickness. Right ventricular systolic function is normal. There is normal pulmonary artery systolic pressure. The tricuspid regurgitant velocity is 1.40 m/s, and  with an assumed right atrial pressure of 10 mmHg, the estimated right ventricular systolic pressure is 99991111 mmHg. Left Atrium: Left atrial size was normal in size. Right Atrium: Right atrial size was normal in size. Pericardium: There is no evidence of pericardial effusion. Mitral Valve: The mitral valve is normal in structure. Mild mitral valve regurgitation. Tricuspid Valve: The tricuspid valve is normal in structure. Tricuspid valve regurgitation is trivial. Aortic Valve: The aortic valve is normal in structure. Aortic valve regurgitation is not visualized. Aortic valve mean gradient measures 2.8 mmHg. Aortic valve peak gradient measures 5.6 mmHg. Aortic valve area, by VTI measures 1.87 cm. Pulmonic Valve: The pulmonic valve was normal in structure. Pulmonic valve regurgitation is not visualized. Aorta: The aortic root and ascending aorta are structurally normal, with no evidence of dilitation. IAS/Shunts: No atrial level shunt detected by color flow Doppler.  LEFT VENTRICLE PLAX 2D LVIDd:         4.27 cm  Diastology LVIDs:         3.14 cm  LV e' lateral:   4.90 cm/s LV PW:         1.57 cm  LV E/e' lateral: 12.2 LV IVS:        1.01 cm  LV e' medial:    6.20 cm/s LVOT diam:     2.00 cm  LV E/e' medial:  9.7 LV SV:         42 LV SV Index:   20 LVOT Area:     3.14 cm  LEFT ATRIUM           Index        RIGHT ATRIUM           Index LA diam:      3.70 cm 1.71 cm/m  RA Area:     18.00 cm LA Vol (A2C): 33.9 ml 15.69 ml/m RA Volume:   44.80 ml  20.73 ml/m LA Vol (A4C): 49.2 ml 22.77 ml/m  AORTIC VALVE AV Area (Vmax):    1.68 cm AV Area (Vmean):   1.64 cm AV Area (VTI):     1.87  cm AV Vmax:           118.40 cm/s AV Vmean:          80.280 cm/s AV VTI:            0.227 m AV Peak Grad:      5.6 mmHg AV Mean Grad:      2.8 mmHg LVOT Vmax:         63.30 cm/s LVOT Vmean:        41.800 cm/s LVOT VTI:          0.135 m LVOT/AV VTI ratio: 0.60 MITRAL VALVE               TRICUSPID VALVE MV Area (PHT): 2.77 cm    TR Peak grad:   7.8 mmHg MV Decel Time: 274 msec    TR Vmax:        140.00 cm/s MV E velocity: 60.00 cm/s MV A velocity: 75.40 cm/s  SHUNTS MV E/A ratio:  0.80        Systemic VTI:  0.14 m                            Systemic Diam: 2.00 cm Serafina Royals MD Electronically signed by Serafina Royals MD Signature Date/Time: 02/06/2020/3:54:33 PM    Final     Medications:   .  stroke: mapping our early stages of recovery book   Does not apply Once  . aspirin  325 mg Oral Daily  . atorvastatin  80 mg Oral Daily  . enoxaparin (LOVENOX) injection  40 mg Subcutaneous Q24H  . levothyroxine  125 mcg Oral QAC breakfast   Continuous Infusions:    LOS: 1 day   Zephaniah Lubrano  [facility]     02/07/2020, 12:28 PM

## 2020-02-07 NOTE — Evaluation (Signed)
Speech Language Pathology Evaluation Patient Details Name: Leah Rogers MRN: [id number] DOB: 1961/02/24 Today's Date: 02/07/2020 Time: 1100-1140 SLP Time Calculation (min) (ACUTE ONLY): 40 min  Problem List:  Patient Active Problem List   Diagnosis Date Noted  . CVA (cerebral vascular accident) ([place]) 02/06/2020  . Acute CVA (cerebrovascular accident) ([place]) 02/06/2020  . Infected sebaceous cyst 10/26/2018  . Screen for colon cancer 04/01/2017  . S/P total thyroidectomy 10/14/2015  . Papillary thyroid carcinoma ([place]) 10/01/2015  . Osteoarthritis of left knee 09/02/2015  . Hidradenitis axillaris 09/02/2015   Past Medical History:  Past Medical History:  Diagnosis Date  . Arthritis   . Cancer ([place])    Thyroid  . Heart murmur   . Thyroid disease    Past Surgical History:  Past Surgical History:  Procedure Laterality Date  . COLONOSCOPY WITH PROPOFOL N/A 03/20/2016   Procedure: COLONOSCOPY WITH PROPOFOL;  Surgeon: Lucilla Lame, MD;  Location: [place];  Service: Endoscopy;  Laterality: N/A;  . ENDOMETRIAL BIOPSY  2013   benign- post menopausal bleeding  . THYROIDECTOMY N/A 10/14/2015   Procedure: THYROIDECTOMY;  Surgeon: Margaretha Sheffield, MD;  Location: [facility];  Service: ENT;  Laterality: N/A;  . TUBAL LIGATION     HPI:  Pt is a 59 y.o. female with medical history significant for arthritis, thyroid cancer who presents to the emergency room for evaluation of sudden onset right-sided weakness and numbness with associated facial droop.  She denies having any headaches, blurry vision, dizziness or lightheadedness.  She denies any difficulty swallowing.  Onset of symptoms was about 1 AM while at work and patient presented to the emergency room at about 9:15 AM.  Since this morning, pt has c/o difficulty w/ her speech; more effortful swallowing. Facial asymmetry is noted on R side; RU weakness reported by pt as well.  MRI: "Acute small vessel infarct involving the left basal  ganglia and adjacent white matter".    Assessment / Plan / Recommendation Clinical Impression  Pt appears to present w/ Dysarthria s/p MRI: "Acute small vessel infarct involving the left basal ganglia and adjacent white matter". This Dysarthria impacts her verbal communication at the word-phrase level. Receptive and Expressive language tasks appeared [facility]; No Cognitive deficits noted in general orientation and awarness tasks. OM exam revealed Moderate lingual weakness(r lateral, protrusion) and R deviation. Pt instructed on lingual sweeping and Dry swallow to clear any saliva pooling. When pt utilized strategies of slowing rate of speech, increasing volume of speech, and over-articulating(effort!) speech sounds in words, pt's Intelligibility improved at the word-phrase level. Practiced this w/ pt; encouraged her to pause and relax during conversation as well and to monitor breath support to complete words/sentences w/ good clarity. Discussed the importance of sitting fully upright to support diahpragmatic breathing and breath support for speech. Handouts given. Recommend pt f/u w/ Outpatient skilled ST servcies for continued assessment and Dysarthria tx working on strategies and to receive further education/support to improve communication in ADLs. CM/NSG/MD updated. Pt agreed.    SLP Assessment  SLP Recommendation/Assessment: All further Speech Lanaguage Pathology  needs can be addressed in the next venue of care SLP Visit Diagnosis: Dysarthria and anarthria (R47.1)(dysarthria)    Follow Up Recommendations  Outpatient SLP    Frequency and Duration (tbd outpt)  (tbd outpt)      SLP Evaluation Cognition  Overall Cognitive Status: Within Functional Limits for tasks assessed Arousal/Alertness: Awake/alert Orientation Level: Oriented X4 Attention: Focused;Sustained Focused Attention: Appears intact Sustained Attention: Appears intact  Awareness: Appears intact Problem Solving: Appears  intact Executive Function: Decision Making Decision Making: Appears intact Behaviors: (n/a) Safety/Judgment: Appears intact Comments: she nodded she needed help sitting up [place] of Cognitive Functioning: (n/a)       Comprehension  Auditory Comprehension Overall Auditory Comprehension: Appears within functional limits for tasks assessed Yes/No Questions: Within Functional Limits Commands: Within Functional Limits Conversation: Simple Other Conversation Comments: dysarthria Visual Recognition/Discrimination Discrimination: Not tested Reading Comprehension Reading Status: Within funtional limits(word-phrase level; clock(time))    Expression Expression Primary Mode of Expression: Verbal Verbal Expression Overall Verbal Expression: Appears within functional limits for tasks assessed(but impacted by Dysarthria) Initiation: No impairment Automatic Speech: Name;Social Response;Counting;Day of week Level of Generative/Spontaneous Verbalization: Word;Phrase Repetition: No impairment Naming: No impairment Pragmatics: No impairment Interfering Components: Speech intelligibility Effective Techniques: Articulatory cues Non-Verbal Means of Communication: Not applicable Written Expression Dominant Hand: Right Written Expression: Not tested   Oral / Motor  Oral Motor/Sensory Function Overall Oral Motor/Sensory Function: Moderate impairment Facial ROM: Reduced right Facial Symmetry: Abnormal symmetry right Facial Strength: Reduced right Facial Sensation: Reduced right(slight) Lingual ROM: Reduced right(slight-min) Lingual Symmetry: Abnormal symmetry right Lingual Strength: Reduced;Suspected CN XII (hypoglossal) dysfunction(moreso protrusion) Lingual Sensation: Within Functional Limits Velum: Within Functional Limits(grossly) Mandible: Within Functional Limits Motor Speech Overall Motor Speech: Impaired Respiration: Within functional limits Phonation: Low vocal  intensity(min) Resonance: Within functional limits Articulation: Impaired Level of Impairment: Word Intelligibility: Intelligibility reduced Word: 25-49% accurate Phrase: 25-49% accurate Effective Techniques: Slow rate;Increased vocal intensity;Over-articulate   GO                     Orinda Kenner, [place], CCC-SLP Lydell Moga 02/07/2020, 1:02 PM

## 2020-02-07 NOTE — Progress Notes (Signed)
Physical Therapy Treatment Patient Details Name: Leah Rogers MRN: [id number] DOB: June 30, 1961 Today's Date: 02/07/2020    History of Present Illness Leah Rogers is a 34yoF who comes [facility] on 5[facility]18 c acute onset facial droop, RUE weakness. Pt had Lt TKA in October with good outcome, was back to work. PTA pt was a fully independent community dwelling adult.    PT Comments    Pt in be dupon entry finishing up with SLP-CCC. Pt agreeable to participate, reports mild improvement in RUE function. Dysarthria seems similar to previous day, will defer to SLP, however pt remain reluctant to speak ad lib. Pt still looks somewhat drowsy, but improve from yesterday. Pt reports a good night's rest last night. Pt performs all mobility with modified independence for additional time. Author assists with diaper tightening upon standing. Unclear why pt has diaper and purewick in place: pt denies baseline incontinence or baseline diaper use. Pt has no acute abnormality to weakness, AMB function or RLE. Pt declines authors offer to doff. Pt AMB 4 laps around the unit with mild fatigue after lap 2 and motivation needed for 4th lap. Pt has baseline orthopedic gait abnormalities, but is at baseline for AMB mobility. Unclear why pt is listed as 'High Falls Risk' in system, especially with a NSG score of 7. Author concerned that this will become a deterrent to mobility with NSG. Best evidence suggests great benefit from moderate cardiovascular activity after stroke. Will continue to follow.       Follow Up Recommendations  No PT follow up     Equipment Recommendations  None recommended by PT    Recommendations for Other Services       Precautions / Restrictions Precautions Precautions: None Restrictions Weight Bearing Restrictions: No    Mobility  Bed Mobility Overal bed mobility: Modified Independent Bed Mobility: Supine to Sit     Supine to sit: Modified independent (Device/Increase time)      General bed mobility comments: MOD I for increased time + rails sup<>sit from near flat bed   Transfers Overall transfer level: Modified independent Equipment used: None Transfers: Sit to/from Stand Sit to Stand: Modified independent (Device/Increase time)         General transfer comment: No LOB or abnormality  Ambulation/Gait Ambulation/Gait assistance: Modified independent (Device/Increase time) Gait Distance (Feet): 700 Feet Assistive device: None Gait Pattern/deviations: WFL(Within Functional Limits) Gait velocity: 0.82m/s   General Gait Details: no acute abnormality, no asymetry, no LOB   Stairs             Wheelchair Mobility    Modified Rankin (Stroke Patients Only)       Balance Overall balance assessment: Independent                                          Cognition Arousal/Alertness: Awake/alert Behavior During Therapy: WFL for tasks assessed/performed Overall Cognitive Status: Within Functional Limits for tasks assessed                                        Exercises Other Exercises Other Exercises: Pt educated re: OT role, DME recs, d/c recs, home/routines modifications, theraputty (provided) HEP education, falls prevention, energy conservation Other Exercises: Oral hygiene, LBD, sup<>sit, sit<>stand, bed mobility, sitting/standing balance/tolerance, theraputty (roll, pinch, ball)  General Comments        Pertinent Vitals/Pain Pain Assessment: No/denies pain    Home Living Family/patient expects to be discharged to:: Private residence Living Arrangements: Spouse/significant other;Children Available Help at Discharge: Family Type of Home: Mobile home Home Access: Stairs to enter Entrance Stairs-Rails: None Home Layout: One level Home Equipment: Environmental consultant - 2 wheels;Kasandra Knudsen - single point Additional Comments: from TKA in October c Dr. 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 [employer]   PT Goals (current goals can now be found in the care plan section) Acute Rehab PT Goals Patient Stated Goal: regain RUE strength PT Goal Formulation: With patient Time For Goal Achievement: 02/20/20 Potential to Achieve Goals: Good Progress towards PT goals: Progressing toward goals    Frequency    7X/week      PT Plan Current plan remains appropriate    Co-evaluation              AM-PAC PT "6 Clicks" Mobility   Outcome Measure  Help needed turning from your back to your side while in a flat bed without using bedrails?: None Help needed moving from lying on your back to sitting on the side of a flat bed without using bedrails?: None Help needed moving to and from a bed to a chair (including a wheelchair)?: None Help needed standing up from a chair using your arms (e.g., wheelchair or bedside chair)?: None Help needed to walk in hospital room?: None Help needed climbing 3-5 steps with a railing? : A Little 6 Click Score: 23    End of Session Equipment Utilized During Treatment: Gait belt Activity Tolerance: Patient tolerated treatment well;No increased pain Patient left: with family/visitor present;with call bell/phone within reach;in chair Nurse Communication: Mobility status PT Visit Diagnosis: Muscle weakness (generalized) (M62.81);Other symptoms and signs involving the nervous system (R29.898);Hemiplegia and hemiparesis Hemiplegia - Right/Left: Right Hemiplegia - dominant/non-dominant: Dominant Hemiplegia - caused by: Cerebral infarction     Time: [id number] PT Time Calculation (min) (ACUTE ONLY): 15 min  Charges:  $Therapeutic Exercise: 8-22 mins                     11:16 AM, 02/07/20 Etta Grandchild, PT, DPT Physical Therapist - [facility]  [phone] ([place])    [place] C 02/07/2020, 11:07 AM

## 2020-02-07 NOTE — Progress Notes (Signed)
Subjective: States she is feeling better. R facial droop still present.    Past Medical History:  Diagnosis Date  . Arthritis   . Cancer ([place])    Thyroid  . Heart murmur   . Thyroid disease     Past Surgical History:  Procedure Laterality Date  . COLONOSCOPY WITH PROPOFOL N/A 03/20/2016   Procedure: COLONOSCOPY WITH PROPOFOL;  Surgeon: Lucilla Lame, MD;  Location: [facility];  Service: Endoscopy;  Laterality: N/A;  . ENDOMETRIAL BIOPSY  2013   benign- post menopausal bleeding  . THYROIDECTOMY N/A 10/14/2015   Procedure: THYROIDECTOMY;  Surgeon: Margaretha Sheffield, MD;  Location: [facility];  Service: ENT;  Laterality: N/A;  . TUBAL LIGATION      Family History  Problem Relation Age of Onset  . Hypertension Mother   . Lung cancer Mother   . Heart attack Father   . Heart attack Brother   . Breast cancer Neg Hx     Social History:  reports that she quit smoking about 14 years ago. Her smoking use included cigarettes. She has a 9.00 pack-year smoking history. She has never used smokeless tobacco. She reports that she does not drink alcohol or use drugs.  No Known Allergies  Medications: I have reviewed the patient's current medications.  ROS: History obtained from the patient  General ROS: negative for - chills, fatigue, fever, night sweats, weight gain or weight loss Psychological ROS: negative for - behavioral disorder, hallucinations, memory difficulties, mood swings or suicidal ideation Ophthalmic ROS: negative for - blurry vision, double vision, eye pain or loss of vision ENT ROS: negative for - epistaxis, nasal discharge, oral lesions, sore throat, tinnitus or vertigo Allergy and Immunology ROS: negative for - hives or itchy/watery eyes Hematological and Lymphatic ROS: negative for - bleeding problems, bruising or swollen lymph nodes Endocrine ROS: negative for - galactorrhea, hair pattern changes, polydipsia/polyuria or temperature intolerance Respiratory ROS:  negative for - cough, hemoptysis, shortness of breath or wheezing Cardiovascular ROS: negative for - chest pain, dyspnea on exertion, edema or irregular heartbeat Gastrointestinal ROS: negative for - abdominal pain, diarrhea, hematemesis, nausea/vomiting or stool incontinence Genito-Urinary ROS: negative for - dysuria, hematuria, incontinence or urinary frequency/urgency Musculoskeletal ROS: negative for - joint swelling or muscular weakness Neurological ROS: as noted in HPI Dermatological ROS: negative for rash and skin lesion changes  Physical Examination: Blood pressure 130/86, pulse (!) 59, temperature 98.1 F (36.7 C), temperature source Oral, resp. rate 10, height 5\' 6"  (1.676 m), weight 108.9 kg, SpO2 96 %.  Neurological Examination   Mental Status: Alert, oriented Cranial Nerves: II: Discs flat bilaterally; Visual fields grossly normal, pupils equal, round, reactive to light and accommodation III,IV, VI: ptosis not present, extra-ocular motions intact bilaterally V,VII: R facial drop XI: bilateral shoulder shrug XII: midline tongue extension Motor: Right : Upper extremity   4+/5    Left:     Upper extremity   5/5  Lower extremity   5/5     Lower extremity   5/5 Tone and bulk:normal tone throughout; no atrophy noted Sensory: Pinprick and light touch intact throughout, bilaterally Deep Tendon Reflexes: 1+ and symmetric throughout Plantars: Right: downgoing   Left: downgoing Cerebellar: normal finger-to-nose    Laboratory Studies:   Basic Metabolic Panel: Recent Labs  Lab 02/06/20 0931  NA 137  K 3.8  CL 101  CO2 25  GLUCOSE 112*  BUN 23*  CREATININE 0.80  CALCIUM 8.7*    Liver Function Tests: Recent Labs  Lab 02/06/20 0931  AST 23  ALT 21  ALKPHOS 75  BILITOT 0.4  PROT 8.4*  ALBUMIN 4.2   No results for input(s): LIPASE, AMYLASE in the last 168 hours. No results for input(s): AMMONIA in the last 168 hours.  CBC: Recent Labs  Lab 02/06/20 0931   WBC 6.5  NEUTROABS 4.4  HGB 13.6  HCT 39.3  MCV 90.8  PLT 195    Cardiac Enzymes: No results for input(s): CKTOTAL, CKMB, CKMBINDEX, TROPONINI in the last 168 hours.  BNP: Invalid input(s): POCBNP  CBG: Recent Labs  Lab 02/06/20 0919  GLUCAP 47    Microbiology: Results for orders placed or performed during the hospital encounter of 02/06/20  SARS Coronavirus 2 by RT PCR (hospital order, performed in [facility] hospital lab) Nasopharyngeal Nasopharyngeal Swab     Status: None   Collection Time: 02/06/20 10:06 AM   Specimen: Nasopharyngeal Swab  Result Value Ref Range Status   SARS Coronavirus 2 NEGATIVE NEGATIVE Final    Comment: (NOTE) SARS-CoV-2 target nucleic acids are NOT DETECTED. The SARS-CoV-2 RNA is generally detectable in upper and lower respiratory specimens during the acute phase of infection. The lowest concentration of SARS-CoV-2 viral copies this assay can detect is 250 copies / mL. A negative result does not preclude SARS-CoV-2 infection and should not be used as the sole basis for treatment or other patient management decisions.  A negative result may occur with improper specimen collection / handling, submission of specimen other than nasopharyngeal swab, presence of viral mutation(s) within the areas targeted by this assay, and inadequate number of viral copies (<250 copies / mL). A negative result must be combined with clinical observations, patient history, and epidemiological information. Fact Sheet for Patients:   StrictlyIdeas.no Fact Sheet for Healthcare Providers: BankingDealers.co.za This test is not yet approved or cleared  by the [place] FDA and has been authorized for detection and/or diagnosis of SARS-CoV-2 by FDA under an Emergency Use Authorization (EUA).  This EUA will remain in effect (meaning this test can be used) for the duration of the COVID-19 declaration under Section  564(b)(1) of the Act, 21 U.S.C. section 360bbb-3(b)(1), unless the authorization is terminated or revoked sooner. Performed at [facility], [address] [place], [place] [address]     Coagulation Studies: Recent Labs    02/06/20 0931  LABPROT 12.4  INR 1.0    Urinalysis: No results for input(s): COLORURINE, LABSPEC, PHURINE, GLUCOSEU, HGBUR, BILIRUBINUR, KETONESUR, PROTEINUR, UROBILINOGEN, NITRITE, LEUKOCYTESUR in the last 168 hours.  Invalid input(s): APPERANCEUR  Lipid Panel:     Component Value Date/Time   CHOL 203 (H) 02/07/2020 0504   CHOL 191 01/02/2016 1131   TRIG 51 02/07/2020 0504   HDL 52 02/07/2020 0504   HDL 58 01/02/2016 1131   CHOLHDL 3.9 02/07/2020 0504   VLDL 10 02/07/2020 0504   LDLCALC 141 (H) 02/07/2020 0504   LDLCALC 118 (H) 01/02/2016 1131    HgbA1C: No results found for: HGBA1C  Urine Drug Screen:  No results found for: LABOPIA, COCAINSCRNUR, LABBENZ, AMPHETMU, THCU, LABBARB  Alcohol Level: No results for input(s): ETH in the last 168 hours.  Other results: EKG: normal EKG, normal sinus rhythm, unchanged from previous tracings.  Imaging: CT ANGIO HEAD W OR WO CONTRAST  Result Date: 02/06/2020 CLINICAL DATA:  Neuro deficit, acute, stroke suspected. Additional history provided: Sudden onset right-sided weakness with numbness. EXAM: CT ANGIOGRAPHY HEAD AND NECK TECHNIQUE: Multidetector CT imaging of the head and neck  was performed using the standard protocol during bolus administration of intravenous contrast. Multiplanar CT image reconstructions and MIPs were obtained to evaluate the vascular anatomy. Carotid stenosis measurements (when applicable) are obtained utilizing NASCET criteria, using the distal internal carotid diameter as the denominator. CONTRAST:  24mL OMNIPAQUE IOHEXOL 350 MG/ML SOLN COMPARISON:  Noncontrast head CT performed earlier the same day 02/06/2020. FINDINGS: CT HEAD FINDINGS Brain: Again demonstrated is a  subcentimeter focus of low density within the paramedian right pons (series 3, image 9). Redemonstrated chronic lacunar infarct within the right basal ganglia and radiating white matter tracts. Unchanged bilateral basal ganglia mineralization. Cerebral volume is normal for age. There is no acute intracranial hemorrhage. No demarcated cortical infarct is identified. No extra-axial fluid collection. No evidence of intracranial mass. No midline shift. Vascular: Reported below. Skull: Normal. Negative for fracture or focal lesion. Sinuses: No significant paranasal sinus disease or mastoid effusion. Orbits: No acute abnormality. Review of the MIP images confirms the above findings CTA NECK FINDINGS Aortic arch: Standard aortic branching. No hemodynamically significant innominate or proximal subclavian artery stenosis. Right carotid system: CCA and ICA patent within the neck without measurable stenosis. Minimal mixed plaque at the carotid bifurcation. Left carotid system: CCA and ICA patent within the neck without measurable stenosis. No significant atherosclerotic disease. Vertebral arteries: Codominant. The right vertebral arteries patent within the neck without significant stenosis. Mild atherosclerotic narrowing at the origin of the left vertebral artery. Skeleton: No acute bony abnormality or aggressive appearing osseous lesion. Cervical spondylosis. Most notably at C5-C6 there is moderate/advanced disc space narrowing with a posterior disc osteophyte complex and uncovertebral hypertrophy. Other neck: Prior thyroidectomy.  No cervical lymphadenopathy. Upper chest: No consolidation within the imaged lung apices. Review of the MIP images confirms the above findings CTA HEAD FINDINGS Anterior circulation: The intracranial internal carotid arteries are patent. Mild calcified plaque within these vessels without significant stenosis. The M1 middle cerebral arteries are patent. Minimal atherosclerotic narrowing of the  proximal left M1 segment (series 14, image 50). No M2 proximal branch occlusion or high-grade proximal stenosis is identified. The anterior cerebral arteries are patent without significant proximal stenosis. 2 mm inferiorly projecting vascular protrusion arising from the region of the anterior communicating artery consistent with small aneurysm (series 16, image 23). Posterior circulation: The intracranial vertebral arteries are patent without significant stenosis, as is the basilar artery. The posterior cerebral arteries are patent proximally without significant stenosis. Posterior communicating arteries are hypoplastic or absent bilaterally. Venous sinuses: Within limitations of contrast timing, no convincing thrombus. Anatomic variants: As described. Review of the MIP images confirms the above findings IMPRESSION: CT head: 1. Unchanged appearance of an indeterminate subcentimeter low-density focus within the paramedian right pons which could reflect an old or recent pontine infarction. 2. No acute demarcated cortical infarct is identified. 3. Redemonstrated chronic infarct within the right basal ganglia and radiating white matter tracks. CTA neck: 1. The common and internal carotid arteries are patent within the neck without measurable stenosis. Minimal mixed plaque within the right carotid bifurcation. 2. The vertebral arteries are patent within the neck. Mild atherosclerotic narrowing at the origin of the left vertebral artery. CTA head: 1. No intracranial large vessel occlusion or proximal high-grade arterial stenosis. 2. Mild atherosclerotic narrowing of the M1 left middle cerebral artery. 3. 2 mm anterior communicating artery aneurysm. Electronically Signed   By: Kellie Simmering DO   On: 02/06/2020 12:25   CT HEAD WO CONTRAST  Result Date: 02/06/2020 CLINICAL DATA:  Sudden  onset of right-sided weakness and numbness with facial droop beginning last night. Slurred speech. EXAM: CT HEAD WITHOUT CONTRAST  TECHNIQUE: Contiguous axial images were obtained from the base of the skull through the vertex without intravenous contrast. COMPARISON:  None. FINDINGS: Brain: Focus of low-density in the right side of the pons that could represent a recent or old infarction. No focal cerebellar finding. Right cerebral hemisphere shows an old infarction in the basal ganglia and radiating white matter tracts. No sign of acute cortical infarction on the right. On the left, there is no CT evidence of old or acute infarction. There is some physiologic calcification in both basal ganglia regions. No hydrocephalus, hemorrhage or extra-axial collection. Vascular: There is atherosclerotic calcification of the major vessels at the base of the brain. Skull: Negative Sinuses/Orbits: Clear/normal Other: None IMPRESSION: Indeterminate low-density in the right side of the pons that could be an old or recent pontine infarction. Old appearing infarction in the right basal ganglia and deep white matter tracts. No focal abnormality seen affecting the left hemisphere. There is atherosclerotic calcification of the major vessels at the base of the brain. Electronically Signed   By: Nelson Chimes M.D.   On: 02/06/2020 09:41   CT ANGIO NECK W OR WO CONTRAST  Result Date: 02/06/2020 CLINICAL DATA:  Neuro deficit, acute, stroke suspected. Additional history provided: Sudden onset right-sided weakness with numbness. EXAM: CT ANGIOGRAPHY HEAD AND NECK TECHNIQUE: Multidetector CT imaging of the head and neck was performed using the standard protocol during bolus administration of intravenous contrast. Multiplanar CT image reconstructions and MIPs were obtained to evaluate the vascular anatomy. Carotid stenosis measurements (when applicable) are obtained utilizing NASCET criteria, using the distal internal carotid diameter as the denominator. CONTRAST:  31mL OMNIPAQUE IOHEXOL 350 MG/ML SOLN COMPARISON:  Noncontrast head CT performed earlier the same day  02/06/2020. FINDINGS: CT HEAD FINDINGS Brain: Again demonstrated is a subcentimeter focus of low density within the paramedian right pons (series 3, image 9). Redemonstrated chronic lacunar infarct within the right basal ganglia and radiating white matter tracts. Unchanged bilateral basal ganglia mineralization. Cerebral volume is normal for age. There is no acute intracranial hemorrhage. No demarcated cortical infarct is identified. No extra-axial fluid collection. No evidence of intracranial mass. No midline shift. Vascular: Reported below. Skull: Normal. Negative for fracture or focal lesion. Sinuses: No significant paranasal sinus disease or mastoid effusion. Orbits: No acute abnormality. Review of the MIP images confirms the above findings CTA NECK FINDINGS Aortic arch: Standard aortic branching. No hemodynamically significant innominate or proximal subclavian artery stenosis. Right carotid system: CCA and ICA patent within the neck without measurable stenosis. Minimal mixed plaque at the carotid bifurcation. Left carotid system: CCA and ICA patent within the neck without measurable stenosis. No significant atherosclerotic disease. Vertebral arteries: Codominant. The right vertebral arteries patent within the neck without significant stenosis. Mild atherosclerotic narrowing at the origin of the left vertebral artery. Skeleton: No acute bony abnormality or aggressive appearing osseous lesion. Cervical spondylosis. Most notably at C5-C6 there is moderate/advanced disc space narrowing with a posterior disc osteophyte complex and uncovertebral hypertrophy. Other neck: Prior thyroidectomy.  No cervical lymphadenopathy. Upper chest: No consolidation within the imaged lung apices. Review of the MIP images confirms the above findings CTA HEAD FINDINGS Anterior circulation: The intracranial internal carotid arteries are patent. Mild calcified plaque within these vessels without significant stenosis. The M1 middle  cerebral arteries are patent. Minimal atherosclerotic narrowing of the proximal left M1 segment (series 14, image 50).  No M2 proximal branch occlusion or high-grade proximal stenosis is identified. The anterior cerebral arteries are patent without significant proximal stenosis. 2 mm inferiorly projecting vascular protrusion arising from the region of the anterior communicating artery consistent with small aneurysm (series 16, image 23). Posterior circulation: The intracranial vertebral arteries are patent without significant stenosis, as is the basilar artery. The posterior cerebral arteries are patent proximally without significant stenosis. Posterior communicating arteries are hypoplastic or absent bilaterally. Venous sinuses: Within limitations of contrast timing, no convincing thrombus. Anatomic variants: As described. Review of the MIP images confirms the above findings IMPRESSION: CT head: 1. Unchanged appearance of an indeterminate subcentimeter low-density focus within the paramedian right pons which could reflect an old or recent pontine infarction. 2. No acute demarcated cortical infarct is identified. 3. Redemonstrated chronic infarct within the right basal ganglia and radiating white matter tracks. CTA neck: 1. The common and internal carotid arteries are patent within the neck without measurable stenosis. Minimal mixed plaque within the right carotid bifurcation. 2. The vertebral arteries are patent within the neck. Mild atherosclerotic narrowing at the origin of the left vertebral artery. CTA head: 1. No intracranial large vessel occlusion or proximal high-grade arterial stenosis. 2. Mild atherosclerotic narrowing of the M1 left middle cerebral artery. 3. 2 mm anterior communicating artery aneurysm. Electronically Signed   By: Kellie Simmering DO   On: 02/06/2020 12:25   MR BRAIN WO CONTRAST  Result Date: 02/06/2020 CLINICAL DATA:  Sudden onset right side weakness and facial droop EXAM: MRI HEAD  WITHOUT CONTRAST TECHNIQUE: Multiplanar, multiecho pulse sequences of the brain and surrounding structures were obtained without intravenous contrast. COMPARISON:  Correlation made with same day CT imaging FINDINGS: Brain: There is mildly reduced diffusion spanning the body of the left caudate, corona radiata, and putamen. Chronic infarct of the right basal ganglia and adjacent white matter with chronic blood products. Chronic infarct of the parasagittal right pons. Additional patchy T2 hyperintensity in the supratentorial white matter is nonspecific but may reflect mild chronic microvascular ischemic changes. Ventricles and sulci are normal in size and configuration. There is no intracranial mass, mass effect, or edema. There is no hydrocephalus or extra-axial fluid collection. Vascular: Major vessel flow voids at the skull base are preserved. Skull and upper cervical spine: Normal marrow signal is preserved. Sinuses/Orbits: Paranasal sinuses are aerated. Orbits are unremarkable. Other: Sella is unremarkable.  Mastoid air cells are clear. IMPRESSION: Acute small vessel infarct involving the left basal ganglia and adjacent white matter. Chronic infarcts and chronic microvascular ischemic changes as detailed above. Electronically Signed   By: Macy Mis M.D.   On: 02/06/2020 14:08   ECHOCARDIOGRAM COMPLETE  Result Date: 02/06/2020    ECHOCARDIOGRAM REPORT   Patient Name:   Leah Rogers Date of Exam: 02/06/2020 Medical Rec #:  [id number]       Height:       66.0 in Accession #:    [id number]      Weight:       240.0 lb Date of Birth:  08/04/1961       BSA:          2.161 m Patient Age:    58 years        BP:           141/88 mmHg Patient Gender: F               HR:           78 bpm. Exam  Location:  [facility] Procedure: 2D Echo, Color Doppler and Cardiac Doppler Indications:     Stroke 434.91  History:         Patient has no prior history of Echocardiogram examinations.                  Signs/Symptoms:Murmur.   Sonographer:     Sherrie Sport RDCS (AE) Referring Phys:  [id number] Collier Bullock Diagnosing Phys: Serafina Royals MD  Sonographer Comments: No parasternal window, no subcostal window and Technically challenging study due to limited acoustic windows. IMPRESSIONS  1. Left ventricular ejection fraction, by estimation, is 50 to 55%. The left ventricle has low normal function. The left ventricle has no regional wall motion abnormalities. Left ventricular diastolic parameters were normal.  2. Right ventricular systolic function is normal. The right ventricular size is normal. There is normal pulmonary artery systolic pressure.  3. The mitral valve is normal in structure. Mild mitral valve regurgitation.  4. The aortic valve is normal in structure. Aortic valve regurgitation is not visualized. FINDINGS  Left Ventricle: Left ventricular ejection fraction, by estimation, is 50 to 55%. The left ventricle has low normal function. The left ventricle has no regional wall motion abnormalities. The left ventricular internal cavity size was normal in size. There is no left ventricular hypertrophy. Left ventricular diastolic parameters were normal. Right Ventricle: The right ventricular size is normal. No increase in right ventricular wall thickness. Right ventricular systolic function is normal. There is normal pulmonary artery systolic pressure. The tricuspid regurgitant velocity is 1.40 m/s, and  with an assumed right atrial pressure of 10 mmHg, the estimated right ventricular systolic pressure is [id number] mmHg. Left Atrium: Left atrial size was normal in size. Right Atrium: Right atrial size was normal in size. Pericardium: There is no evidence of pericardial effusion. Mitral Valve: The mitral valve is normal in structure. Mild mitral valve regurgitation. Tricuspid Valve: The tricuspid valve is normal in structure. Tricuspid valve regurgitation is trivial. Aortic Valve: The aortic valve is normal in structure. Aortic valve regurgitation  is not visualized. Aortic valve mean gradient measures 2.8 mmHg. Aortic valve peak gradient measures 5.6 mmHg. Aortic valve area, by VTI measures 1.87 cm. Pulmonic Valve: The pulmonic valve was normal in structure. Pulmonic valve regurgitation is not visualized. Aorta: The aortic root and ascending aorta are structurally normal, with no evidence of dilitation. IAS/Shunts: No atrial level shunt detected by color flow Doppler.  LEFT VENTRICLE PLAX 2D LVIDd:         4.27 cm  Diastology LVIDs:         3.14 cm  LV e' lateral:   4.90 cm/s LV PW:         1.57 cm  LV E/e' lateral: 12.2 LV IVS:        1.01 cm  LV e' medial:    6.20 cm/s LVOT diam:     2.00 cm  LV E/e' medial:  9.7 LV SV:         42 LV SV Index:   20 LVOT Area:     3.14 cm  LEFT ATRIUM           Index       RIGHT ATRIUM           Index LA diam:      3.70 cm 1.71 cm/m  RA Area:     18.00 cm LA Vol (A2C): 33.9 ml 15.69 ml/m RA Volume:   44.80 ml  20.73 ml/m LA Vol (  A4C): 49.2 ml 22.77 ml/m  AORTIC VALVE AV Area (Vmax):    1.68 cm AV Area (Vmean):   1.64 cm AV Area (VTI):     1.87 cm AV Vmax:           118.40 cm/s AV Vmean:          80.280 cm/s AV VTI:            0.227 m AV Peak Grad:      5.6 mmHg AV Mean Grad:      2.8 mmHg LVOT Vmax:         63.30 cm/s LVOT Vmean:        41.800 cm/s LVOT VTI:          0.135 m LVOT/AV VTI ratio: 0.60 MITRAL VALVE               TRICUSPID VALVE MV Area (PHT): 2.77 cm    TR Peak grad:   7.8 mmHg MV Decel Time: 274 msec    TR Vmax:        140.00 cm/s MV E velocity: 60.00 cm/s MV A velocity: 75.40 cm/s  SHUNTS MV E/A ratio:  0.80        Systemic VTI:  0.14 m                            Systemic Diam: 2.00 cm Serafina Royals MD Electronically signed by Serafina Royals MD Signature Date/Time: 02/06/2020/3:54:33 PM    Final      Assessment/Plan:  59 y.o. female  with medical history significant for arthritis, thyroid cancer who presents to the emergency room for evaluation of sudden onset right-sided weakness and numbness  with associated facial droop.  She denies having any headaches, blurry vision, dizziness or lightheadedness.  She denies any difficulty swallowing.  Onset of symptoms about 1 AM and patient presented to the emergency room at about 9:15 AM. CT scan of the head without contrast shows indeterminate low-density in the right side of the pons that could be an old or recent pontine infarction. Old appearing infarction in the right basal ganglia and deep white matter tracts.  - MRI with L BG stroke in setting of small vessel disease - CTA H/N no intracranial stenosis - ASA 81mg  at home, now 325 daily - pt/ot, d/c planning  02/07/2020, 11:41 AM

## 2020-02-07 NOTE — Evaluation (Addendum)
Occupational Therapy Evaluation Patient Details Name: Leah Rogers MRN: [id number] DOB: [birth year] Today's Date: 02/07/2020    History of Present Illness Leah Rogers is a 89yoF who comes to [facility] on 5/18 c acute onset facial droop, RUE weakness. Pt had Lt TKA in October with good outcome, was back to work. PTA pt was a fully independent community dwelling adult.   Clinical Impression   Pt seen for OT evaluation this date. Prior to hospital admission, pt was independent in all aspects of ADL/IADL, including working full time. Pt lives with her spouse and adult son in a mobile home with 2 steps to enter. Pt presents to acute OT demonstrating impaired ADL performance and functional mobility 2/2 decreased functional strength in dominant RUE and decreased activity tolerance. Pt currently requires SUPERVISION for seated grooming and UBD. SBA for oral hygiene standing sinkside. SUPERVISION seated LBD anticipate SBA in standing. Pt demonstrated intact 5 finger opposition and palm to tip translation of dominant RUE. OT educated pt on theraputty HEP (provided) and pt return demonstrated exercises (snake, ball, pinch). Pt would benefit from skilled OT to address noted impairments and functional limitations (see below for any additional details) in order to maximize safety and independence for return to work while minimizing falls risk and caregiver burden. Upon hospital discharge, recommend OPOT for improved use of dominant RUE to maximize return to work.   Follow Up Recommendations  Outpatient OT    Equipment Recommendations  3 in 1 bedside commode    Recommendations for Other Services       Precautions / Restrictions Precautions Precautions: None Restrictions Weight Bearing Restrictions: No      Mobility Bed Mobility Overal bed mobility: Modified Independent Bed Mobility: Supine to Sit;Sit to Supine           General bed mobility comments: MOD I for increased time + rails sup<>sit  from near flat bed   Transfers Overall transfer level: Needs assistance Equipment used: None Transfers: Sit to/from Stand Sit to Stand: Supervision              Balance Overall balance assessment: Mild deficits observed, not formally tested;Independent(single UE support standing sinkside ~4 mins c no LOBs)                                         ADL either performed or assessed with clinical judgement   ADL Overall ADL's : Needs assistance/impaired                                       General ADL Comments: SUPERVISION seated grooming and UBD. SBA oral hygiene standing sinkside. SUPERVISION seated LBD anticipate SBA in standing      Vision Baseline Vision/History: Wears glasses Wears Glasses: At all times Patient Visual Report: No change from baseline       Perception     Praxis      Pertinent Vitals/Pain Pain Assessment: No/denies pain     Hand Dominance Right   Extremity/Trunk Assessment Upper Extremity Assessment Upper Extremity Assessment: RUE deficits/detail RUE Deficits / Details: Grip 4/5, slow but intact 5 finger opposition and palm to fingertip translation   Lower Extremity Assessment Lower Extremity Assessment: Overall WFL for tasks assessed       Communication Communication Communication: Other (comment)(increased time to articulate clearly)  Cognition Arousal/Alertness: Awake/alert Behavior During Therapy: WFL for tasks assessed/performed Overall Cognitive Status: Within Functional Limits for tasks assessed                                     General Comments       Exercises Exercises: Other exercises Other Exercises Other Exercises: Pt educated re: OT role, DME recs, d/c recs, home/routines modifications, theraputty (provided) HEP education, falls prevention, energy conservation Other Exercises: Oral hygiene, LBD, sup<>sit, sit<>stand, bed mobility, sitting/standing balance/tolerance,  theraputty (roll, pinch, ball)   Shoulder Instructions      Home Living Family/patient expects to be discharged to:: Private residence Living Arrangements: Spouse/significant other;Children Available Help at Discharge: Family Type of Home: Mobile home Home Access: Stairs to enter Entrance Stairs-Number of Steps: 2 Entrance Stairs-Rails: None Home Layout: One level     Bathroom Shower/Tub: Walk-in shower         Home Equipment: Environmental consultant - 2 wheels;Kasandra Knudsen - single point   Additional Comments: from TKA in October c Dr. 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 [employer]        OT Problem List: Decreased activity tolerance;Decreased coordination      OT Treatment/Interventions: Self-care/ADL training;Therapeutic exercise;Neuromuscular education;Energy conservation;DME and/or AE instruction;Therapeutic activities;Patient/family education;Balance training    OT Goals(Current goals can be found in the care plan section) Acute Rehab OT Goals Patient Stated Goal: regain RUE strength OT Goal Formulation: With patient Time For Goal Achievement: 02/21/20 Potential to Achieve Goals: Good ADL Goals Pt Will Perform Eating: with modified independence;sitting Pt Will Transfer to Toilet: with modified independence;ambulating;regular height toilet(c LRAD PRN) Pt/caregiver will Perform Home Exercise Program: Increased strength;Right Upper extremity;With theraputty;Independently  OT Frequency: Min 2X/week   Barriers to D/C: Decreased caregiver support;Inaccessible home environment          Co-evaluation              AM-PAC OT "6 Clicks" Daily Activity     Outcome Measure Help from another person eating meals?: A Little Help from another person taking care of personal grooming?: None Help from another person toileting, which includes using toliet, bedpan, or urinal?: None Help from another person  bathing (including washing, rinsing, drying)?: A Little Help from another person to put on and taking off regular upper body clothing?: None Help from another person to put on and taking off regular lower body clothing?: A Little 6 Click Score: 21   End of Session    Activity Tolerance: Patient tolerated treatment well Patient left: in bed;with call bell/phone within reach;with SCD's reapplied  OT Visit Diagnosis: Other abnormalities of gait and mobility (R26.89);Hemiplegia and hemiparesis Hemiplegia - Right/Left: Right Hemiplegia - dominant/non-dominant: Dominant Hemiplegia - caused by: Cerebral infarction                [id number] OT Time Calculation (min): 15 min Charges:  OT General Charges $OT Visit: 1 Visit OT Evaluation $OT Eval Low Complexity: 1 Low OT Treatments $Self Care/Home Management : 8-22 mins  Dessie Coma, M.S. OTR/L  02/07/20, 9:44 AM

## 2020-02-07 NOTE — Progress Notes (Signed)
Speech Language Pathology Treatment: Dysphagia  Patient Details Name: Leah Rogers MRN: [id number] DOB: [birth year] Today's Date: 02/07/2020 Time: 1030-1110 SLP Time Calculation (min) (ACUTE ONLY): 40 min  Assessment / Plan / Recommendation Clinical Impression  Pt seen this morning for ongoing assessment of swallowing and trials to upgrade diet as able, safe. Pt awake, verbal and engaged w/ SLP, MD. Noted continued Dysarthria but w/ effort and volume, articulation and intelligibility improved. Pt given positioning support upright in bed d/t min RUE weakness; noted she tended to lean to her R side. Pt stated she "practiced" her swallowing last night w/ Ice Chips as SLP had recommended.  Pt consumed trials of single ice chips, Nectar liquids via TSP, Cup, then purees and broken down solids(minced) moistened w/ puree. No overt clinical s/s of aspiration were noted w/ all po trials; vocal quality clear and no decline in respiratory status during/post trials. Laryngeal excursion appeared [facility]. Pt gave effort to her swallows then followed w/ lingual sweeping and a f/u, DRY swallow to clear any potential oropharyngeal residue remaining from initial swallow. Oral phase c/b Slow more deliberate oral movements; min more of a munching vs rotary chewing pattern. W/ Time, and cues to lingual sweep to clear, pt was able to complete bolus management/mastication, A-P transfer, swallows, and oral clearing. Slight R labial residue noted which pt was are of and cleared; no other leakage noted. Instructed pt on the importance of SMALL bites/sips for BEST oral control and clearing.  During oral exam and exercises, noted R lingual deviation and decreased anterior/protrusion strength. Posterior lingual strength was adequate.  Recommend a diet upgrade to Dysphagia level 2 (minced foods w/ added purees) and Nectar liquids via Cup; strict aspiration precautions and swallowing strategies as instructed on; Pills in Puree for  safer swallowing, control. ST services will f/u tomorrow w/ ongoing assessment of swallowing and trials to upgrade diet consistency as safe; potential objective swallow assessment(mbss) as indicated.   The recommendation was given for Outpatient ST services for Dysphagia, Dysarthria. Above was discussed w/ pt, MD and NSG/CM.    HPI HPI: Pt is a 59 y.o. female with medical history significant for arthritis, thyroid cancer who presents to the emergency room for evaluation of sudden onset right-sided weakness and numbness with associated facial droop.  She denies having any headaches, blurry vision, dizziness or lightheadedness.  She denies any difficulty swallowing.  Onset of symptoms was about 1 AM while at work and patient presented to the emergency room at about 9:15 AM.  Since this morning, pt has c/o difficulty w/ her speech; more effortful swallowing. Facial asymmetry is noted on R side; RU weakness reported by pt as well.       SLP Plan  Continue with current plan of care;MBS(TBD)       Recommendations  Diet recommendations: Dysphagia 2 (fine chop);Nectar-thick liquid Liquids provided via: Cup;No straw Medication Administration: Whole meds with puree(vs need to Crush w/ puree) Supervision: Patient able to self feed;Intermittent supervision to cue for compensatory strategies Compensations: Minimize environmental distractions;Slow rate;Small sips/bites;Lingual sweep for clearance of pocketing;Multiple dry swallows after each bite/sip;Follow solids with liquid Postural Changes and/or Swallow Maneuvers: Seated upright 90 degrees;Upright 30-60 min after meal;Out of bed for meals                General recommendations: (TBD) Oral Care Recommendations: Oral care BID;Oral care before and after PO;Patient independent with oral care(setup support) Follow up Recommendations: Outpatient SLP(TBD) SLP Visit Diagnosis: Dysphagia, oropharyngeal phase (R13.12)(dysarthria) Plan:  Continue with  current plan of care;MBS(TBD)       GO                 Leah Rogers, [place], CCC-SLP Kaslyn [place] 02/07/2020, 12:26 PM

## 2020-02-07 NOTE — TOC Transition Note (Signed)
Transition of Care [facility]) - CM/SW Discharge Note   Patient Details  Name: Leah Rogers MRN: [id number] Date of Birth: [birth year]  Transition of Care [facility]) CM/SW Contact:  Elease Hashimoto, LCSW Phone Number: 02/07/2020, 12:17 PM   Clinical Narrative:  Met with pt and husband who is at beside to discuss discharge needs. Pt lives with her husband and grown son. She works full time at [employer] and plans to return once able. Discussed with husband to get FMLA and STD forms from them to have completed by pt's PCP. Therapists recommend SP and OT follow up and both are on board with this. Have faxed over OP completed form will contact pt and husband at home to set up appointments. No other needs. Awaiting MD to DC pt once feels medically stable for DC.      Barriers to Discharge: Continued Medical Work up   Patient Goals and CMS Choice Patient states their goals for this hospitalization and ongoing recovery are:: Pt tries to talk but has difficulty-She did say she wants to go home and get therapy as an OP CMS Medicare.gov Compare Post Acute Care list provided to:: Patient Choice offered to / list presented to : Patient, Spouse  Discharge Placement                       Discharge Plan and Services In-house Referral: Clinical Social Work                                   Social Determinants of Health (SDOH) Interventions     Readmission Risk Interventions No flowsheet data found.

## 2020-02-08 MED ORDER — ATORVASTATIN CALCIUM 80 MG PO TABS: 80.0000 mg | ORAL_TABLET | Freq: Every day | ORAL | 0 refills | Status: DC

## 2020-02-08 MED ORDER — ASPIRIN 325 MG PO TBEC: 325.0000 mg | DELAYED_RELEASE_TABLET | Freq: Every day | ORAL | Status: DC

## 2020-02-08 NOTE — Progress Notes (Signed)
IV removed before discharge. Went over discharge instructions and medications with patient and patients son. All questions answered and they stated that they understood. Patient going home POV with son.

## 2020-02-08 NOTE — Progress Notes (Addendum)
Physical Therapy Treatment Patient Details Name: Leah Rogers MRN: [id number] DOB: [birth year] Today's Date: 02/08/2020    History of Present Illness Leah Rogers is a 68yoF who comes to [facility] on 5/18 c acute onset facial droop, RUE weakness. Pt had Lt TKA in October with good outcome, was back to work. PTA pt was a fully independent community dwelling adult.    PT Comments    Pt in bed upon entry, agreeable to participate. Pt denies any changes to RUE and Rt facial/lingual CVA symptoms compared to previous day. Pt performs bed mobility, transfers, standing pericare, and AMB with independence, supervision provided for AMB due to telemetry leads. Pt encouraged to progress distance to 5 laps this date, which she is able to do. Pt has fatigue toward latter 25% of AMB, but far less in appearence compared to previous day. Pt also increased gait speed by >25% this date. Pt left up in chair, all needs met. RN updated and educated on encouraging pt to AMB to BR for ADL needs. Pt has no additional skilled PT needs at this time. PT signing off.     Follow Up Recommendations  No PT follow up     Equipment Recommendations  None recommended by PT    Recommendations for Other Services       Precautions / Restrictions Precautions Precautions: None Restrictions Weight Bearing Restrictions: No    Mobility  Bed Mobility Overal bed mobility: Independent Bed Mobility: Supine to Sit              Transfers Overall transfer level: Independent Equipment used: None Transfers: Sit to/from Stand              Ambulation/Gait   Gait Distance (Feet): 840 Feet Assistive device: None Gait Pattern/deviations: WFL(Within Functional Limits) Gait velocity: 0.12ms this date (0.76m on 5/19)   General Gait Details: no acute abnormality, no asymetry, no LOB; progressively worse compensated trendelenburg on Rt side, unclear if chronic or mild and realted to CVA. Pt denies hip pain; pain denies  history of Right hip OA.   Stairs             Wheelchair Mobility    Modified Rankin (Stroke Patients Only)       Balance Overall balance assessment: Independent                                          Cognition Arousal/Alertness: Awake/alert Behavior During Therapy: WFL for tasks assessed/performed Overall Cognitive Status: Within Functional Limits for tasks assessed                                        Exercises      General Comments        Pertinent Vitals/Pain Pain Assessment: No/denies pain    Home Living                      Prior Function            PT Goals (current goals can now be found in the care plan section) Acute Rehab PT Goals Patient Stated Goal: regain RUE strength PT Goal Formulation: With patient Time For Goal Achievement: 02/20/20 Potential to Achieve Goals: Good Progress towards PT goals: Not progressing toward goals - comment  Frequency    7X/week      PT Plan Current plan remains appropriate    Co-evaluation              AM-PAC PT "6 Clicks" Mobility   Outcome Measure  Help needed turning from your back to your side while in a flat bed without using bedrails?: None Help needed moving from lying on your back to sitting on the side of a flat bed without using bedrails?: None Help needed moving to and from a bed to a chair (including a wheelchair)?: None Help needed standing up from a chair using your arms (e.g., wheelchair or bedside chair)?: None Help needed to walk in hospital room?: None Help needed climbing 3-5 steps with a railing? : A Little 6 Click Score: 23    End of Session   Activity Tolerance: Patient tolerated treatment well;No increased pain Patient left: with family/visitor present;with call bell/phone within reach;in chair Nurse Communication: Mobility status PT Visit Diagnosis: Muscle weakness (generalized) (M62.81);Other symptoms and signs  involving the nervous system (R29.898);Hemiplegia and hemiparesis Hemiplegia - Right/Left: Right Hemiplegia - dominant/non-dominant: Dominant Hemiplegia - caused by: Cerebral infarction     Time: 8333-8329 PT Time Calculation (min) (ACUTE ONLY): 13 min  Charges:  $Therapeutic Exercise: 8-22 mins                     11:49 AM, 02/08/20 Etta Grandchild, PT, DPT Physical Therapist - [facility]  [phone] ([place])    [place] C 02/08/2020, 11:43 AM

## 2020-02-08 NOTE — Discharge Summary (Signed)
Physician Discharge Summary  Leah Rogers [id number] DOB: [birth year] DOA: 02/06/2020  PCP: Glean Hess, MD  Admit date: 02/06/2020 Discharge date: 02/08/2020  Discharge disposition: Home   Recommendations for Outpatient Follow-Up:   Outpatient follow-up with PCP in 1 week Outpatient follow-up with neurologist in 1 month   Discharge Diagnosis:   Principal Problem:   CVA (cerebral vascular accident) [facility]) Active Problems:   S/P total thyroidectomy   Acute CVA (cerebrovascular accident) [facility])    Discharge Condition: Stable.  Diet recommendation: Heart healthy diet  Code status: Full code.    Hospital Course:   Leah Rogers is an 59 y.o. female femalewith medical history significant forarthritis, thyroid cancer who presented to the emergency room for evaluation of sudden onset right-sided weakness and numbness with associated right facial droop. Work-up revealed acute left basal ganglia infarct.  She was seen in consultation by the neurologist.  She was previously taking low-dose aspirin but this was increased to 325 mg daily at the recommendation of the neurologist.  She was also evaluated by PT, OT and ST.  Outpatient physical therapy was recommended.  Dysphagia 3 diet was recommended by the speech therapist.  Her speech is still slurred although it has improved.  Weakness in right upper extremity has improved.  She is deemed stable for discharge to home today.     Discharge Exam:   Vitals:   02/07/20 2341 02/08/20 0814  BP: 115/84 (!) 154/98  Pulse: (!) 54   Resp: 11 12  Temp: 97.8 F (36.6 C) 98.3 F (36.8 C)  SpO2: 97%    Vitals:   02/07/20 0616 02/07/20 1312 02/07/20 2341 02/08/20 0814  BP: 130/86 (!) 139/99 115/84 (!) 154/98  Pulse:  (!) 59 (!) 54   Resp:  14 11 12   Temp: 98.1 F (36.7 C) 98.2 F (36.8 C) 97.8 F (36.6 C) 98.3 F (36.8 C)  TempSrc: Oral  Oral Oral  SpO2: 96%  97%   Weight:      Height:         GEN:  NAD SKIN: No rash EYES: EOMI ENT: MMM CV: RRR PULM: CTA B ABD: soft, ND, NT, +BS CNS: AAO x 3, right facial droop EXT: No edema or tenderness   The results of significant diagnostics from this hospitalization (including imaging, microbiology, ancillary and laboratory) are listed below for reference.     Procedures and Diagnostic Studies:   CT ANGIO HEAD W OR WO CONTRAST  Result Date: 02/06/2020 CLINICAL DATA:  Neuro deficit, acute, stroke suspected. Additional history provided: Sudden onset right-sided weakness with numbness. EXAM: CT ANGIOGRAPHY HEAD AND NECK TECHNIQUE: Multidetector CT imaging of the head and neck was performed using the standard protocol during bolus administration of intravenous contrast. Multiplanar CT image reconstructions and MIPs were obtained to evaluate the vascular anatomy. Carotid stenosis measurements (when applicable) are obtained utilizing NASCET criteria, using the distal internal carotid diameter as the denominator. CONTRAST:  66mL OMNIPAQUE IOHEXOL 350 MG/ML SOLN COMPARISON:  Noncontrast head CT performed earlier the same day 02/06/2020. FINDINGS: CT HEAD FINDINGS Brain: Again demonstrated is a subcentimeter focus of low density within the paramedian right pons (series 3, image 9). Redemonstrated chronic lacunar infarct within the right basal ganglia and radiating white matter tracts. Unchanged bilateral basal ganglia mineralization. Cerebral volume is normal for age. There is no acute intracranial hemorrhage. No demarcated cortical infarct is identified. No extra-axial fluid collection. No evidence of intracranial mass. No midline shift. Vascular: Reported below.  Skull: Normal. Negative for fracture or focal lesion. Sinuses: No significant paranasal sinus disease or mastoid effusion. Orbits: No acute abnormality. Review of the MIP images confirms the above findings CTA NECK FINDINGS Aortic arch: Standard aortic branching. No hemodynamically significant innominate  or proximal subclavian artery stenosis. Right carotid system: CCA and ICA patent within the neck without measurable stenosis. Minimal mixed plaque at the carotid bifurcation. Left carotid system: CCA and ICA patent within the neck without measurable stenosis. No significant atherosclerotic disease. Vertebral arteries: Codominant. The right vertebral arteries patent within the neck without significant stenosis. Mild atherosclerotic narrowing at the origin of the left vertebral artery. Skeleton: No acute bony abnormality or aggressive appearing osseous lesion. Cervical spondylosis. Most notably at C5-C6 there is moderate/advanced disc space narrowing with a posterior disc osteophyte complex and uncovertebral hypertrophy. Other neck: Prior thyroidectomy.  No cervical lymphadenopathy. Upper chest: No consolidation within the imaged lung apices. Review of the MIP images confirms the above findings CTA HEAD FINDINGS Anterior circulation: The intracranial internal carotid arteries are patent. Mild calcified plaque within these vessels without significant stenosis. The M1 middle cerebral arteries are patent. Minimal atherosclerotic narrowing of the proximal left M1 segment (series 14, image 50). No M2 proximal branch occlusion or high-grade proximal stenosis is identified. The anterior cerebral arteries are patent without significant proximal stenosis. 2 mm inferiorly projecting vascular protrusion arising from the region of the anterior communicating artery consistent with small aneurysm (series 16, image 23). Posterior circulation: The intracranial vertebral arteries are patent without significant stenosis, as is the basilar artery. The posterior cerebral arteries are patent proximally without significant stenosis. Posterior communicating arteries are hypoplastic or absent bilaterally. Venous sinuses: Within limitations of contrast timing, no convincing thrombus. Anatomic variants: As described. Review of the MIP images  confirms the above findings IMPRESSION: CT head: 1. Unchanged appearance of an indeterminate subcentimeter low-density focus within the paramedian right pons which could reflect an old or recent pontine infarction. 2. No acute demarcated cortical infarct is identified. 3. Redemonstrated chronic infarct within the right basal ganglia and radiating white matter tracks. CTA neck: 1. The common and internal carotid arteries are patent within the neck without measurable stenosis. Minimal mixed plaque within the right carotid bifurcation. 2. The vertebral arteries are patent within the neck. Mild atherosclerotic narrowing at the origin of the left vertebral artery. CTA head: 1. No intracranial large vessel occlusion or proximal high-grade arterial stenosis. 2. Mild atherosclerotic narrowing of the M1 left middle cerebral artery. 3. 2 mm anterior communicating artery aneurysm. Electronically Signed   By: Kellie Simmering DO   On: 02/06/2020 12:25   CT HEAD WO CONTRAST  Result Date: 02/06/2020 CLINICAL DATA:  Sudden onset of right-sided weakness and numbness with facial droop beginning last night. Slurred speech. EXAM: CT HEAD WITHOUT CONTRAST TECHNIQUE: Contiguous axial images were obtained from the base of the skull through the vertex without intravenous contrast. COMPARISON:  None. FINDINGS: Brain: Focus of low-density in the right side of the pons that could represent a recent or old infarction. No focal cerebellar finding. Right cerebral hemisphere shows an old infarction in the basal ganglia and radiating white matter tracts. No sign of acute cortical infarction on the right. On the left, there is no CT evidence of old or acute infarction. There is some physiologic calcification in both basal ganglia regions. No hydrocephalus, hemorrhage or extra-axial collection. Vascular: There is atherosclerotic calcification of the major vessels at the base of the brain. Skull: Negative Sinuses/Orbits: Clear/normal Other:  None  IMPRESSION: Indeterminate low-density in the right side of the pons that could be an old or recent pontine infarction. Old appearing infarction in the right basal ganglia and deep white matter tracts. No focal abnormality seen affecting the left hemisphere. There is atherosclerotic calcification of the major vessels at the base of the brain. Electronically Signed   By: Nelson Chimes M.D.   On: 02/06/2020 09:41   CT ANGIO NECK W OR WO CONTRAST  Result Date: 02/06/2020 CLINICAL DATA:  Neuro deficit, acute, stroke suspected. Additional history provided: Sudden onset right-sided weakness with numbness. EXAM: CT ANGIOGRAPHY HEAD AND NECK TECHNIQUE: Multidetector CT imaging of the head and neck was performed using the standard protocol during bolus administration of intravenous contrast. Multiplanar CT image reconstructions and MIPs were obtained to evaluate the vascular anatomy. Carotid stenosis measurements (when applicable) are obtained utilizing NASCET criteria, using the distal internal carotid diameter as the denominator. CONTRAST:  108mL OMNIPAQUE IOHEXOL 350 MG/ML SOLN COMPARISON:  Noncontrast head CT performed earlier the same day 02/06/2020. FINDINGS: CT HEAD FINDINGS Brain: Again demonstrated is a subcentimeter focus of low density within the paramedian right pons (series 3, image 9). Redemonstrated chronic lacunar infarct within the right basal ganglia and radiating white matter tracts. Unchanged bilateral basal ganglia mineralization. Cerebral volume is normal for age. There is no acute intracranial hemorrhage. No demarcated cortical infarct is identified. No extra-axial fluid collection. No evidence of intracranial mass. No midline shift. Vascular: Reported below. Skull: Normal. Negative for fracture or focal lesion. Sinuses: No significant paranasal sinus disease or mastoid effusion. Orbits: No acute abnormality. Review of the MIP images confirms the above findings CTA NECK FINDINGS Aortic arch: Standard  aortic branching. No hemodynamically significant innominate or proximal subclavian artery stenosis. Right carotid system: CCA and ICA patent within the neck without measurable stenosis. Minimal mixed plaque at the carotid bifurcation. Left carotid system: CCA and ICA patent within the neck without measurable stenosis. No significant atherosclerotic disease. Vertebral arteries: Codominant. The right vertebral arteries patent within the neck without significant stenosis. Mild atherosclerotic narrowing at the origin of the left vertebral artery. Skeleton: No acute bony abnormality or aggressive appearing osseous lesion. Cervical spondylosis. Most notably at C5-C6 there is moderate/advanced disc space narrowing with a posterior disc osteophyte complex and uncovertebral hypertrophy. Other neck: Prior thyroidectomy.  No cervical lymphadenopathy. Upper chest: No consolidation within the imaged lung apices. Review of the MIP images confirms the above findings CTA HEAD FINDINGS Anterior circulation: The intracranial internal carotid arteries are patent. Mild calcified plaque within these vessels without significant stenosis. The M1 middle cerebral arteries are patent. Minimal atherosclerotic narrowing of the proximal left M1 segment (series 14, image 50). No M2 proximal branch occlusion or high-grade proximal stenosis is identified. The anterior cerebral arteries are patent without significant proximal stenosis. 2 mm inferiorly projecting vascular protrusion arising from the region of the anterior communicating artery consistent with small aneurysm (series 16, image 23). Posterior circulation: The intracranial vertebral arteries are patent without significant stenosis, as is the basilar artery. The posterior cerebral arteries are patent proximally without significant stenosis. Posterior communicating arteries are hypoplastic or absent bilaterally. Venous sinuses: Within limitations of contrast timing, no convincing thrombus.  Anatomic variants: As described. Review of the MIP images confirms the above findings IMPRESSION: CT head: 1. Unchanged appearance of an indeterminate subcentimeter low-density focus within the paramedian right pons which could reflect an old or recent pontine infarction. 2. No acute demarcated cortical infarct is identified. 3. Redemonstrated chronic  infarct within the right basal ganglia and radiating white matter tracks. CTA neck: 1. The common and internal carotid arteries are patent within the neck without measurable stenosis. Minimal mixed plaque within the right carotid bifurcation. 2. The vertebral arteries are patent within the neck. Mild atherosclerotic narrowing at the origin of the left vertebral artery. CTA head: 1. No intracranial large vessel occlusion or proximal high-grade arterial stenosis. 2. Mild atherosclerotic narrowing of the M1 left middle cerebral artery. 3. 2 mm anterior communicating artery aneurysm. Electronically Signed   By: Kellie Simmering DO   On: 02/06/2020 12:25   MR BRAIN WO CONTRAST  Result Date: 02/06/2020 CLINICAL DATA:  Sudden onset right side weakness and facial droop EXAM: MRI HEAD WITHOUT CONTRAST TECHNIQUE: Multiplanar, multiecho pulse sequences of the brain and surrounding structures were obtained without intravenous contrast. COMPARISON:  Correlation made with same day CT imaging FINDINGS: Brain: There is mildly reduced diffusion spanning the body of the left caudate, corona radiata, and putamen. Chronic infarct of the right basal ganglia and adjacent white matter with chronic blood products. Chronic infarct of the parasagittal right pons. Additional patchy T2 hyperintensity in the supratentorial white matter is nonspecific but may reflect mild chronic microvascular ischemic changes. Ventricles and sulci are normal in size and configuration. There is no intracranial mass, mass effect, or edema. There is no hydrocephalus or extra-axial fluid collection. Vascular: Major  vessel flow voids at the skull base are preserved. Skull and upper cervical spine: Normal marrow signal is preserved. Sinuses/Orbits: Paranasal sinuses are aerated. Orbits are unremarkable. Other: Sella is unremarkable.  Mastoid air cells are clear. IMPRESSION: Acute small vessel infarct involving the left basal ganglia and adjacent white matter. Chronic infarcts and chronic microvascular ischemic changes as detailed above. Electronically Signed   By: Macy Mis M.D.   On: 02/06/2020 14:08   ECHOCARDIOGRAM COMPLETE  Result Date: 02/06/2020    ECHOCARDIOGRAM REPORT   Patient Name:   AVE SCHMIER Date of Exam: 02/06/2020 Medical Rec #:  [id number]       Height:       66.0 in Accession #:    [id number]      Weight:       240.0 lb Date of Birth:  July 30, 1961       BSA:          2.161 m Patient Age:    43 years        BP:           141/88 mmHg Patient Gender: F               HR:           78 bpm. Exam Location:  [facility] Procedure: 2D Echo, Color Doppler and Cardiac Doppler Indications:     Stroke 434.91  History:         Patient has no prior history of Echocardiogram examinations.                  Signs/Symptoms:Murmur.  Sonographer:     Sherrie Sport RDCS (AE) Referring Phys:  [id number] Collier Bullock Diagnosing Phys: Serafina Royals MD  Sonographer Comments: No parasternal window, no subcostal window and Technically challenging study due to limited acoustic windows. IMPRESSIONS  1. Left ventricular ejection fraction, by estimation, is 50 to 55%. The left ventricle has low normal function. The left ventricle has no regional wall motion abnormalities. Left ventricular diastolic parameters were normal.  2. Right ventricular systolic function is normal. The right  ventricular size is normal. There is normal pulmonary artery systolic pressure.  3. The mitral valve is normal in structure. Mild mitral valve regurgitation.  4. The aortic valve is normal in structure. Aortic valve regurgitation is not visualized. FINDINGS  Left  Ventricle: Left ventricular ejection fraction, by estimation, is 50 to 55%. The left ventricle has low normal function. The left ventricle has no regional wall motion abnormalities. The left ventricular internal cavity size was normal in size. There is no left ventricular hypertrophy. Left ventricular diastolic parameters were normal. Right Ventricle: The right ventricular size is normal. No increase in right ventricular wall thickness. Right ventricular systolic function is normal. There is normal pulmonary artery systolic pressure. The tricuspid regurgitant velocity is 1.40 m/s, and  with an assumed right atrial pressure of 10 mmHg, the estimated right ventricular systolic pressure is [id number] mmHg. Left Atrium: Left atrial size was normal in size. Right Atrium: Right atrial size was normal in size. Pericardium: There is no evidence of pericardial effusion. Mitral Valve: The mitral valve is normal in structure. Mild mitral valve regurgitation. Tricuspid Valve: The tricuspid valve is normal in structure. Tricuspid valve regurgitation is trivial. Aortic Valve: The aortic valve is normal in structure. Aortic valve regurgitation is not visualized. Aortic valve mean gradient measures 2.8 mmHg. Aortic valve peak gradient measures 5.6 mmHg. Aortic valve area, by VTI measures 1.87 cm. Pulmonic Valve: The pulmonic valve was normal in structure. Pulmonic valve regurgitation is not visualized. Aorta: The aortic root and ascending aorta are structurally normal, with no evidence of dilitation. IAS/Shunts: No atrial level shunt detected by color flow Doppler.  LEFT VENTRICLE PLAX 2D LVIDd:         4.27 cm  Diastology LVIDs:         3.14 cm  LV e' lateral:   4.90 cm/s LV PW:         1.57 cm  LV E/e' lateral: 12.2 LV IVS:        1.01 cm  LV e' medial:    6.20 cm/s LVOT diam:     2.00 cm  LV E/e' medial:  9.7 LV SV:         42 LV SV Index:   20 LVOT Area:     3.14 cm  LEFT ATRIUM           Index       RIGHT ATRIUM           Index LA  diam:      3.70 cm 1.71 cm/m  RA Area:     18.00 cm LA Vol (A2C): 33.9 ml 15.69 ml/m RA Volume:   44.80 ml  20.73 ml/m LA Vol (A4C): 49.2 ml 22.77 ml/m  AORTIC VALVE AV Area (Vmax):    1.68 cm AV Area (Vmean):   1.64 cm AV Area (VTI):     1.87 cm AV Vmax:           118.40 cm/s AV Vmean:          80.280 cm/s AV VTI:            0.227 m AV Peak Grad:      5.6 mmHg AV Mean Grad:      2.8 mmHg LVOT Vmax:         63.30 cm/s LVOT Vmean:        41.800 cm/s LVOT VTI:          0.135 m LVOT/AV VTI ratio: 0.60 MITRAL VALVE  TRICUSPID VALVE MV Area (PHT): 2.77 cm    TR Peak grad:   7.8 mmHg MV Decel Time: 274 msec    TR Vmax:        140.00 cm/s MV E velocity: 60.00 cm/s MV A velocity: 75.40 cm/s  SHUNTS MV E/A ratio:  0.80        Systemic VTI:  0.14 m                            Systemic Diam: 2.00 cm Serafina Royals MD Electronically signed by Serafina Royals MD Signature Date/Time: 02/06/2020/3:54:33 PM    Final      Labs:   Basic Metabolic Panel: Recent Labs  Lab 02/06/20 0931  NA 137  K 3.8  CL 101  CO2 25  GLUCOSE 112*  BUN 23*  CREATININE 0.80  CALCIUM 8.7*   GFR Estimated Creatinine Clearance: 93.4 mL/min (by C-G formula based on SCr of 0.8 mg/dL). Liver Function Tests: Recent Labs  Lab 02/06/20 0931  AST 23  ALT 21  ALKPHOS 75  BILITOT 0.4  PROT 8.4*  ALBUMIN 4.2   No results for input(s): LIPASE, AMYLASE in the last 168 hours. No results for input(s): AMMONIA in the last 168 hours. Coagulation profile Recent Labs  Lab 02/06/20 0931  INR 1.0    CBC: Recent Labs  Lab 02/06/20 0931  WBC 6.5  NEUTROABS 4.4  HGB 13.6  HCT 39.3  MCV 90.8  PLT 195   Cardiac Enzymes: No results for input(s): CKTOTAL, CKMB, CKMBINDEX, TROPONINI in the last 168 hours. BNP: Invalid input(s): POCBNP CBG: Recent Labs  Lab 02/06/20 0919  GLUCAP 94   D-Dimer No results for input(s): DDIMER in the last 72 hours. Hgb A1c Recent Labs    02/07/20 0504  HGBA1C 5.8*    Lipid Profile Recent Labs    02/07/20 0504  CHOL 203*  HDL 52  LDLCALC 141*  TRIG 51  CHOLHDL 3.9   Thyroid function studies No results for input(s): TSH, T4TOTAL, T3FREE, THYROIDAB in the last 72 hours.  Invalid input(s): FREET3 Anemia work up No results for input(s): VITAMINB12, FOLATE, FERRITIN, TIBC, IRON, RETICCTPCT in the last 72 hours. Microbiology Recent Results (from the past 240 hour(s))  SARS Coronavirus 2 by RT PCR (hospital order, performed in [facility] hospital lab) Nasopharyngeal Nasopharyngeal Swab     Status: None   Collection Time: 02/06/20 10:06 AM   Specimen: Nasopharyngeal Swab  Result Value Ref Range Status   SARS Coronavirus 2 NEGATIVE NEGATIVE Final    Comment: (NOTE) SARS-CoV-2 target nucleic acids are NOT DETECTED. The SARS-CoV-2 RNA is generally detectable in upper and lower respiratory specimens during the acute phase of infection. The lowest concentration of SARS-CoV-2 viral copies this assay can detect is 250 copies / mL. A negative result does not preclude SARS-CoV-2 infection and should not be used as the sole basis for treatment or other patient management decisions.  A negative result may occur with improper specimen collection / handling, submission of specimen other than nasopharyngeal swab, presence of viral mutation(s) within the areas targeted by this assay, and inadequate number of viral copies (<250 copies / mL). A negative result must be combined with clinical observations, patient history, and epidemiological information. Fact Sheet for Patients:   StrictlyIdeas.no Fact Sheet for Healthcare Providers: BankingDealers.co.za This test is not yet approved or cleared  by the [place] FDA and has been authorized for detection and/or diagnosis  of SARS-CoV-2 by FDA under an Emergency Use Authorization (EUA).  This EUA will remain in effect (meaning this test can be used) for the  duration of the COVID-19 declaration under Section 564(b)(1) of the Act, 21 U.S.C. section 360bbb-3(b)(1), unless the authorization is terminated or revoked sooner. Performed at [facility], [address], [place], [place] [address]      Discharge Instructions:   Discharge Instructions    Diet - low sodium heart healthy   Complete by: As directed    Increase activity slowly   Complete by: As directed      Allergies as of 02/08/2020   No Known Allergies     Medication List    TAKE these medications   aspirin 325 MG EC tablet Take 1 tablet (325 mg total) by mouth daily. What changed:   medication strength  how much to take   atorvastatin 80 MG tablet Commonly known as: LIPITOR Take 1 tablet (80 mg total) by mouth daily. Start taking on: Feb 09, 2020   calcium gluconate 500 MG tablet Take 500 mg by mouth daily.   cholecalciferol 25 MCG (1000 UNIT) tablet Commonly known as: VITAMIN D3 Take 1,000 Units by mouth daily.   ferrous sulfate 325 (65 FE) MG tablet Take 325 mg by mouth daily with breakfast.   levothyroxine 125 MCG tablet Commonly known as: SYNTHROID Take 125 mcg by mouth daily before breakfast.         Time coordinating discharge: 28 minutes  Signed:  Tiffnay Bossi  [facility] 02/08/2020, 11:11 AM

## 2020-02-08 NOTE — Progress Notes (Signed)
Subjective: States she is feeling better. R facial droop still present. Eating without assistnace.    Past Medical History:  Diagnosis Date  . Arthritis   . Cancer ([place])    Thyroid  . Heart murmur   . Thyroid disease     Past Surgical History:  Procedure Laterality Date  . COLONOSCOPY WITH PROPOFOL N/A 03/20/2016   Procedure: COLONOSCOPY WITH PROPOFOL;  Surgeon: Lucilla Lame, MD;  Location: [place];  Service: Endoscopy;  Laterality: N/A;  . ENDOMETRIAL BIOPSY  2013   benign- post menopausal bleeding  . THYROIDECTOMY N/A 10/14/2015   Procedure: THYROIDECTOMY;  Surgeon: Margaretha Sheffield, MD;  Location: [facility];  Service: ENT;  Laterality: N/A;  . TUBAL LIGATION      Family History  Problem Relation Age of Onset  . Hypertension Mother   . Lung cancer Mother   . Heart attack Father   . Heart attack Brother   . Breast cancer Neg Hx     Social History:  reports that she quit smoking about 14 years ago. Her smoking use included cigarettes. She has a 9.00 pack-year smoking history. She has never used smokeless tobacco. She reports that she does not drink alcohol or use drugs.  No Known Allergies  Medications: I have reviewed the patient's current medications.  ROS: History obtained from the patient  General ROS: negative for - chills, fatigue, fever, night sweats, weight gain or weight loss Psychological ROS: negative for - behavioral disorder, hallucinations, memory difficulties, mood swings or suicidal ideation Ophthalmic ROS: negative for - blurry vision, double vision, eye pain or loss of vision ENT ROS: negative for - epistaxis, nasal discharge, oral lesions, sore throat, tinnitus or vertigo Allergy and Immunology ROS: negative for - hives or itchy/watery eyes Hematological and Lymphatic ROS: negative for - bleeding problems, bruising or swollen lymph nodes Endocrine ROS: negative for - galactorrhea, hair pattern changes, polydipsia/polyuria or temperature  intolerance Respiratory ROS: negative for - cough, hemoptysis, shortness of breath or wheezing Cardiovascular ROS: negative for - chest pain, dyspnea on exertion, edema or irregular heartbeat Gastrointestinal ROS: negative for - abdominal pain, diarrhea, hematemesis, nausea/vomiting or stool incontinence Genito-Urinary ROS: negative for - dysuria, hematuria, incontinence or urinary frequency/urgency Musculoskeletal ROS: negative for - joint swelling or muscular weakness Neurological ROS: as noted in HPI Dermatological ROS: negative for rash and skin lesion changes  Physical Examination: Blood pressure (!) 154/98, pulse (!) 54, temperature 98.3 F (36.8 C), temperature source Oral, resp. rate 12, height 5\' 6"  (1.676 m), weight 108.9 kg, SpO2 97 %.  Neurological Examination   Mental Status: Alert, oriented Cranial Nerves: II: Discs flat bilaterally; Visual fields grossly normal, pupils equal, round, reactive to light and accommodation III,IV, VI: ptosis not present, extra-ocular motions intact bilaterally V,VII: R facial drop XI: bilateral shoulder shrug XII: midline tongue extension Motor: Right : Upper extremity   4+/5    Left:     Upper extremity   5/5  Lower extremity   5/5     Lower extremity   5/5 Tone and bulk:normal tone throughout; no atrophy noted Sensory: Pinprick and light touch intact throughout, bilaterally Deep Tendon Reflexes: 1+ and symmetric throughout Plantars: Right: downgoing   Left: downgoing Cerebellar: normal finger-to-nose    Laboratory Studies:   Basic Metabolic Panel: Recent Labs  Lab 02/06/20 0931  NA 137  K 3.8  CL 101  CO2 25  GLUCOSE 112*  BUN 23*  CREATININE 0.80  CALCIUM 8.7*    Liver Function  Tests: Recent Labs  Lab 02/06/20 0931  AST 23  ALT 21  ALKPHOS 75  BILITOT 0.4  PROT 8.4*  ALBUMIN 4.2   No results for input(s): LIPASE, AMYLASE in the last 168 hours. No results for input(s): AMMONIA in the last 168  hours.  CBC: Recent Labs  Lab 02/06/20 0931  WBC 6.5  NEUTROABS 4.4  HGB 13.6  HCT 39.3  MCV 90.8  PLT 195    Cardiac Enzymes: No results for input(s): CKTOTAL, CKMB, CKMBINDEX, TROPONINI in the last 168 hours.  BNP: Invalid input(s): POCBNP  CBG: Recent Labs  Lab 02/06/20 0919  GLUCAP 48    Microbiology: Results for orders placed or performed during the hospital encounter of 02/06/20  SARS Coronavirus 2 by RT PCR (hospital order, performed in [facility] hospital lab) Nasopharyngeal Nasopharyngeal Swab     Status: None   Collection Time: 02/06/20 10:06 AM   Specimen: Nasopharyngeal Swab  Result Value Ref Range Status   SARS Coronavirus 2 NEGATIVE NEGATIVE Final    Comment: (NOTE) SARS-CoV-2 target nucleic acids are NOT DETECTED. The SARS-CoV-2 RNA is generally detectable in upper and lower respiratory specimens during the acute phase of infection. The lowest concentration of SARS-CoV-2 viral copies this assay can detect is 250 copies / mL. A negative result does not preclude SARS-CoV-2 infection and should not be used as the sole basis for treatment or other patient management decisions.  A negative result may occur with improper specimen collection / handling, submission of specimen other than nasopharyngeal swab, presence of viral mutation(s) within the areas targeted by this assay, and inadequate number of viral copies (<250 copies / mL). A negative result must be combined with clinical observations, patient history, and epidemiological information. Fact Sheet for Patients:   StrictlyIdeas.no Fact Sheet for Healthcare Providers: BankingDealers.co.za This test is not yet approved or cleared  by the [place] FDA and has been authorized for detection and/or diagnosis of SARS-CoV-2 by FDA under an Emergency Use Authorization (EUA).  This EUA will remain in effect (meaning this test can be used) for the duration  of the COVID-19 declaration under Section 564(b)(1) of the Act, 21 U.S.C. section 360bbb-3(b)(1), unless the authorization is terminated or revoked sooner. Performed at [facility], [address] [place],  [address]     Coagulation Studies: Recent Labs    02/06/20 0931  LABPROT 12.4  INR 1.0    Urinalysis: No results for input(s): COLORURINE, LABSPEC, PHURINE, GLUCOSEU, HGBUR, BILIRUBINUR, KETONESUR, PROTEINUR, UROBILINOGEN, NITRITE, LEUKOCYTESUR in the last 168 hours.  Invalid input(s): APPERANCEUR  Lipid Panel:     Component Value Date/Time   CHOL 203 (H) 02/07/2020 0504   CHOL 191 01/02/2016 1131   TRIG 51 02/07/2020 0504   HDL 52 02/07/2020 0504   HDL 58 01/02/2016 1131   CHOLHDL 3.9 02/07/2020 0504   VLDL 10 02/07/2020 0504   LDLCALC 141 (H) 02/07/2020 0504   LDLCALC 118 (H) 01/02/2016 1131    HgbA1C:  Lab Results  Component Value Date   HGBA1C 5.8 (H) 02/07/2020    Urine Drug Screen:  No results found for: LABOPIA, COCAINSCRNUR, LABBENZ, AMPHETMU, THCU, LABBARB  Alcohol Level: No results for input(s): ETH in the last 168 hours.  Other results: EKG: normal EKG, normal sinus rhythm, unchanged from previous tracings.  Imaging: CT ANGIO HEAD W OR WO CONTRAST  Result Date: 02/06/2020 CLINICAL DATA:  Neuro deficit, acute, stroke suspected. Additional history provided: Sudden onset right-sided weakness with numbness. EXAM:  CT ANGIOGRAPHY HEAD AND NECK TECHNIQUE: Multidetector CT imaging of the head and neck was performed using the standard protocol during bolus administration of intravenous contrast. Multiplanar CT image reconstructions and MIPs were obtained to evaluate the vascular anatomy. Carotid stenosis measurements (when applicable) are obtained utilizing NASCET criteria, using the distal internal carotid diameter as the denominator. CONTRAST:  105mL OMNIPAQUE IOHEXOL 350 MG/ML SOLN COMPARISON:  Noncontrast head CT performed earlier the same  day 02/06/2020. FINDINGS: CT HEAD FINDINGS Brain: Again demonstrated is a subcentimeter focus of low density within the paramedian right pons (series 3, image 9). Redemonstrated chronic lacunar infarct within the right basal ganglia and radiating white matter tracts. Unchanged bilateral basal ganglia mineralization. Cerebral volume is normal for age. There is no acute intracranial hemorrhage. No demarcated cortical infarct is identified. No extra-axial fluid collection. No evidence of intracranial mass. No midline shift. Vascular: Reported below. Skull: Normal. Negative for fracture or focal lesion. Sinuses: No significant paranasal sinus disease or mastoid effusion. Orbits: No acute abnormality. Review of the MIP images confirms the above findings CTA NECK FINDINGS Aortic arch: Standard aortic branching. No hemodynamically significant innominate or proximal subclavian artery stenosis. Right carotid system: CCA and ICA patent within the neck without measurable stenosis. Minimal mixed plaque at the carotid bifurcation. Left carotid system: CCA and ICA patent within the neck without measurable stenosis. No significant atherosclerotic disease. Vertebral arteries: Codominant. The right vertebral arteries patent within the neck without significant stenosis. Mild atherosclerotic narrowing at the origin of the left vertebral artery. Skeleton: No acute bony abnormality or aggressive appearing osseous lesion. Cervical spondylosis. Most notably at C5-C6 there is moderate/advanced disc space narrowing with a posterior disc osteophyte complex and uncovertebral hypertrophy. Other neck: Prior thyroidectomy.  No cervical lymphadenopathy. Upper chest: No consolidation within the imaged lung apices. Review of the MIP images confirms the above findings CTA HEAD FINDINGS Anterior circulation: The intracranial internal carotid arteries are patent. Mild calcified plaque within these vessels without significant stenosis. The M1 middle  cerebral arteries are patent. Minimal atherosclerotic narrowing of the proximal left M1 segment (series 14, image 50). No M2 proximal branch occlusion or high-grade proximal stenosis is identified. The anterior cerebral arteries are patent without significant proximal stenosis. 2 mm inferiorly projecting vascular protrusion arising from the region of the anterior communicating artery consistent with small aneurysm (series 16, image 23). Posterior circulation: The intracranial vertebral arteries are patent without significant stenosis, as is the basilar artery. The posterior cerebral arteries are patent proximally without significant stenosis. Posterior communicating arteries are hypoplastic or absent bilaterally. Venous sinuses: Within limitations of contrast timing, no convincing thrombus. Anatomic variants: As described. Review of the MIP images confirms the above findings IMPRESSION: CT head: 1. Unchanged appearance of an indeterminate subcentimeter low-density focus within the paramedian right pons which could reflect an old or recent pontine infarction. 2. No acute demarcated cortical infarct is identified. 3. Redemonstrated chronic infarct within the right basal ganglia and radiating white matter tracks. CTA neck: 1. The common and internal carotid arteries are patent within the neck without measurable stenosis. Minimal mixed plaque within the right carotid bifurcation. 2. The vertebral arteries are patent within the neck. Mild atherosclerotic narrowing at the origin of the left vertebral artery. CTA head: 1. No intracranial large vessel occlusion or proximal high-grade arterial stenosis. 2. Mild atherosclerotic narrowing of the M1 left middle cerebral artery. 3. 2 mm anterior communicating artery aneurysm. Electronically Signed   By: Kellie Simmering DO   On: 02/06/2020 12:25  CT ANGIO NECK W OR WO CONTRAST  Result Date: 02/06/2020 CLINICAL DATA:  Neuro deficit, acute, stroke suspected. Additional history  provided: Sudden onset right-sided weakness with numbness. EXAM: CT ANGIOGRAPHY HEAD AND NECK TECHNIQUE: Multidetector CT imaging of the head and neck was performed using the standard protocol during bolus administration of intravenous contrast. Multiplanar CT image reconstructions and MIPs were obtained to evaluate the vascular anatomy. Carotid stenosis measurements (when applicable) are obtained utilizing NASCET criteria, using the distal internal carotid diameter as the denominator. CONTRAST:  48mL OMNIPAQUE IOHEXOL 350 MG/ML SOLN COMPARISON:  Noncontrast head CT performed earlier the same day 02/06/2020. FINDINGS: CT HEAD FINDINGS Brain: Again demonstrated is a subcentimeter focus of low density within the paramedian right pons (series 3, image 9). Redemonstrated chronic lacunar infarct within the right basal ganglia and radiating white matter tracts. Unchanged bilateral basal ganglia mineralization. Cerebral volume is normal for age. There is no acute intracranial hemorrhage. No demarcated cortical infarct is identified. No extra-axial fluid collection. No evidence of intracranial mass. No midline shift. Vascular: Reported below. Skull: Normal. Negative for fracture or focal lesion. Sinuses: No significant paranasal sinus disease or mastoid effusion. Orbits: No acute abnormality. Review of the MIP images confirms the above findings CTA NECK FINDINGS Aortic arch: Standard aortic branching. No hemodynamically significant innominate or proximal subclavian artery stenosis. Right carotid system: CCA and ICA patent within the neck without measurable stenosis. Minimal mixed plaque at the carotid bifurcation. Left carotid system: CCA and ICA patent within the neck without measurable stenosis. No significant atherosclerotic disease. Vertebral arteries: Codominant. The right vertebral arteries patent within the neck without significant stenosis. Mild atherosclerotic narrowing at the origin of the left vertebral artery.  Skeleton: No acute bony abnormality or aggressive appearing osseous lesion. Cervical spondylosis. Most notably at C5-C6 there is moderate/advanced disc space narrowing with a posterior disc osteophyte complex and uncovertebral hypertrophy. Other neck: Prior thyroidectomy.  No cervical lymphadenopathy. Upper chest: No consolidation within the imaged lung apices. Review of the MIP images confirms the above findings CTA HEAD FINDINGS Anterior circulation: The intracranial internal carotid arteries are patent. Mild calcified plaque within these vessels without significant stenosis. The M1 middle cerebral arteries are patent. Minimal atherosclerotic narrowing of the proximal left M1 segment (series 14, image 50). No M2 proximal branch occlusion or high-grade proximal stenosis is identified. The anterior cerebral arteries are patent without significant proximal stenosis. 2 mm inferiorly projecting vascular protrusion arising from the region of the anterior communicating artery consistent with small aneurysm (series 16, image 23). Posterior circulation: The intracranial vertebral arteries are patent without significant stenosis, as is the basilar artery. The posterior cerebral arteries are patent proximally without significant stenosis. Posterior communicating arteries are hypoplastic or absent bilaterally. Venous sinuses: Within limitations of contrast timing, no convincing thrombus. Anatomic variants: As described. Review of the MIP images confirms the above findings IMPRESSION: CT head: 1. Unchanged appearance of an indeterminate subcentimeter low-density focus within the paramedian right pons which could reflect an old or recent pontine infarction. 2. No acute demarcated cortical infarct is identified. 3. Redemonstrated chronic infarct within the right basal ganglia and radiating white matter tracks. CTA neck: 1. The common and internal carotid arteries are patent within the neck without measurable stenosis. Minimal  mixed plaque within the right carotid bifurcation. 2. The vertebral arteries are patent within the neck. Mild atherosclerotic narrowing at the origin of the left vertebral artery. CTA head: 1. No intracranial large vessel occlusion or proximal high-grade arterial stenosis. 2. Mild  atherosclerotic narrowing of the M1 left middle cerebral artery. 3. 2 mm anterior communicating artery aneurysm. Electronically Signed   By: Kellie Simmering DO   On: 02/06/2020 12:25   MR BRAIN WO CONTRAST  Result Date: 02/06/2020 CLINICAL DATA:  Sudden onset right side weakness and facial droop EXAM: MRI HEAD WITHOUT CONTRAST TECHNIQUE: Multiplanar, multiecho pulse sequences of the brain and surrounding structures were obtained without intravenous contrast. COMPARISON:  Correlation made with same day CT imaging FINDINGS: Brain: There is mildly reduced diffusion spanning the body of the left caudate, corona radiata, and putamen. Chronic infarct of the right basal ganglia and adjacent white matter with chronic blood products. Chronic infarct of the parasagittal right pons. Additional patchy T2 hyperintensity in the supratentorial white matter is nonspecific but may reflect mild chronic microvascular ischemic changes. Ventricles and sulci are normal in size and configuration. There is no intracranial mass, mass effect, or edema. There is no hydrocephalus or extra-axial fluid collection. Vascular: Major vessel flow voids at the skull base are preserved. Skull and upper cervical spine: Normal marrow signal is preserved. Sinuses/Orbits: Paranasal sinuses are aerated. Orbits are unremarkable. Other: Sella is unremarkable.  Mastoid air cells are clear. IMPRESSION: Acute small vessel infarct involving the left basal ganglia and adjacent white matter. Chronic infarcts and chronic microvascular ischemic changes as detailed above. Electronically Signed   By: Macy Mis M.D.   On: 02/06/2020 14:08   ECHOCARDIOGRAM COMPLETE  Result Date:  02/06/2020    ECHOCARDIOGRAM REPORT   Patient Name:   Leah Rogers Date of Exam: 02/06/2020 Medical Rec #:  [id number]       Height:       66.0 in Accession #:    [id number]      Weight:       240.0 lb Date of Birth:  [birth year]       BSA:          2.161 m Patient Age:    26 years        BP:           141/88 mmHg Patient Gender: F               HR:           78 bpm. Exam Location:  [facility] Procedure: 2D Echo, Color Doppler and Cardiac Doppler Indications:     Stroke 434.91  History:         Patient has no prior history of Echocardiogram examinations.                  Signs/Symptoms:Murmur.  Sonographer:     Sherrie Sport RDCS (AE) Referring Phys:  [id number] Collier Bullock Diagnosing Phys: Serafina Royals MD  Sonographer Comments: No parasternal window, no subcostal window and Technically challenging study due to limited acoustic windows. IMPRESSIONS  1. Left ventricular ejection fraction, by estimation, is 50 to 55%. The left ventricle has low normal function. The left ventricle has no regional wall motion abnormalities. Left ventricular diastolic parameters were normal.  2. Right ventricular systolic function is normal. The right ventricular size is normal. There is normal pulmonary artery systolic pressure.  3. The mitral valve is normal in structure. Mild mitral valve regurgitation.  4. The aortic valve is normal in structure. Aortic valve regurgitation is not visualized. FINDINGS  Left Ventricle: Left ventricular ejection fraction, by estimation, is 50 to 55%. The left ventricle has low normal function. The left ventricle has no regional wall motion abnormalities. The left  ventricular internal cavity size was normal in size. There is no left ventricular hypertrophy. Left ventricular diastolic parameters were normal. Right Ventricle: The right ventricular size is normal. No increase in right ventricular wall thickness. Right ventricular systolic function is normal. There is normal pulmonary artery systolic pressure. The  tricuspid regurgitant velocity is 1.40 m/s, and  with an assumed right atrial pressure of 10 mmHg, the estimated right ventricular systolic pressure is 99991111 mmHg. Left Atrium: Left atrial size was normal in size. Right Atrium: Right atrial size was normal in size. Pericardium: There is no evidence of pericardial effusion. Mitral Valve: The mitral valve is normal in structure. Mild mitral valve regurgitation. Tricuspid Valve: The tricuspid valve is normal in structure. Tricuspid valve regurgitation is trivial. Aortic Valve: The aortic valve is normal in structure. Aortic valve regurgitation is not visualized. Aortic valve mean gradient measures 2.8 mmHg. Aortic valve peak gradient measures 5.6 mmHg. Aortic valve area, by VTI measures 1.87 cm. Pulmonic Valve: The pulmonic valve was normal in structure. Pulmonic valve regurgitation is not visualized. Aorta: The aortic root and ascending aorta are structurally normal, with no evidence of dilitation. IAS/Shunts: No atrial level shunt detected by color flow Doppler.  LEFT VENTRICLE PLAX 2D LVIDd:         4.27 cm  Diastology LVIDs:         3.14 cm  LV e' lateral:   4.90 cm/s LV PW:         1.57 cm  LV E/e' lateral: 12.2 LV IVS:        1.01 cm  LV e' medial:    6.20 cm/s LVOT diam:     2.00 cm  LV E/e' medial:  9.7 LV SV:         42 LV SV Index:   20 LVOT Area:     3.14 cm  LEFT ATRIUM           Index       RIGHT ATRIUM           Index LA diam:      3.70 cm 1.71 cm/m  RA Area:     18.00 cm LA Vol (A2C): 33.9 ml 15.69 ml/m RA Volume:   44.80 ml  20.73 ml/m LA Vol (A4C): 49.2 ml 22.77 ml/m  AORTIC VALVE AV Area (Vmax):    1.68 cm AV Area (Vmean):   1.64 cm AV Area (VTI):     1.87 cm AV Vmax:           118.40 cm/s AV Vmean:          80.280 cm/s AV VTI:            0.227 m AV Peak Grad:      5.6 mmHg AV Mean Grad:      2.8 mmHg LVOT Vmax:         63.30 cm/s LVOT Vmean:        41.800 cm/s LVOT VTI:          0.135 m LVOT/AV VTI ratio: 0.60 MITRAL VALVE                TRICUSPID VALVE MV Area (PHT): 2.77 cm    TR Peak grad:   7.8 mmHg MV Decel Time: 274 msec    TR Vmax:        140.00 cm/s MV E velocity: 60.00 cm/s MV A velocity: 75.40 cm/s  SHUNTS MV E/A ratio:  0.80  Systemic VTI:  0.14 m                            Systemic Diam: 2.00 cm Serafina Royals MD Electronically signed by Serafina Royals MD Signature Date/Time: 02/06/2020/3:54:33 PM    Final      Assessment/Plan:  59 y.o. female  with medical history significant for arthritis, thyroid cancer who presents to the emergency room for evaluation of sudden onset right-sided weakness and numbness with associated facial droop.  She denies having any headaches, blurry vision, dizziness or lightheadedness.  She denies any difficulty swallowing.  Onset of symptoms about 1 AM and patient presented to the emergency room at about 9:15 AM. CT scan of the head without contrast shows indeterminate low-density in the right side of the pons that could be an old or recent pontine infarction. Old appearing infarction in the right basal ganglia and deep white matter tracts.  - MRI with L BG stroke in setting of small vessel disease - CTA H/N no intracranial stenosis - ASA 81mg  at home, now 325 daily - pt/ot, d/c planning from Neurological stand point - Will sign off, plz call with questions  02/08/2020, 10:54 AM

## 2020-02-08 NOTE — Progress Notes (Signed)
Speech Language Pathology Treatment: Dysphagia  Patient Details Name: Leah Rogers MRN: [id number] DOB: 08-10-1961 Today's Date: 02/08/2020 Time: [id number] SLP Time Calculation (min) (ACUTE ONLY): 55 min  Assessment / Plan / Recommendation Clinical Impression  Pt seen this morning for ongoing assessment of swallowing and trials to upgrade diet as able, safe. Pt awake, verbal and engaged w/ SLP; sitting in chair. Noted continued Dysarthria but w/ effort and volume, articulation and intelligibility improved. Pt stated she "practiced" her swallowing last night w/ Ice Chips and her Speech exs as SLP had recommended, instructed on.  Pt consumed trials of single ice chips, then SMALL, SINGLE sips of thin liquids (water) via Cup, then purees softened and moistened solids in Small bites. No overt clinical s/s of aspiration were noted w/ trials of thin liquids except x1 when pt appeared distracted by the TV when taking a sip of thin liquid via Cup. An immediate Cough was noted indicating good laryngeal sensation and response. W/ the other trials of thin liquids and po's, vocal quality was clear and no decline in respiratory status during/post trials. Laryngeal excursion appeared [facility]. No further Coughing. Pt gave effort and attention to her swallows as instructed when she became distracted. She followed w/ lingual sweeping and a f/u, DRY swallow to clear any potential oropharyngeal residue or Saliva remaining from initial swallow. Oral phase c/b min Slow, more deliberate oral movements; min more rotary chewing pattern w/ increased textures today. W/ Time, and cues to lingual sweep to clear, pt was able to complete bolus management/mastication, A-P transfer, swallows, and oral clearing. Slight R labial leakage noted w/ thin liquids x2. Instructed pt on the importance of SMALL bites/sips for BEST oral control and clearing.  During oral exam and exercises, noted R lingual deviation and decreased  anterior/protrusion strength. Posterior lingual strength was adequate.  Recommend a diet upgrade to Dysphagia level 2/3 (minced Meats w./ gravies) and Thin liquids via Cup; strict aspiration precautions and swallowing strategies as instructed on; Pills in Puree for safer swallowing, control. ST services will f/u tomorrow w/ ongoing assessment of swallowing and trials to upgrade diet consistency as safe; potential objective swallow assessment(mbss) as indicated.   The recommendation was given for Outpatient ST services for Dysphagia, Dysarthria. Above was discussed w/ pt, MD and NSG/CM.    HPI HPI: Pt is a 59 y.o. female with medical history significant for arthritis, thyroid cancer who presents to the emergency room for evaluation of sudden onset right-sided weakness and numbness with associated facial droop.  She denies having any headaches, blurry vision, dizziness or lightheadedness.  She denies any difficulty swallowing.  Onset of symptoms was about 1 AM while at work and patient presented to the emergency room at about 9:15 AM.  Since this morning, pt has c/o difficulty w/ her speech; more effortful swallowing. Facial asymmetry is noted on R side; RU weakness reported by pt as well.  MRI: "Acute small vessel infarct involving the left basal ganglia and adjacent white matter".       SLP Plan  Continue with current plan of care;MBS(TBD; Outpt f/u)       Recommendations  Diet recommendations: Dysphagia 3 (mechanical soft);Thin liquid(w/ Minced meats, gravies added to moisten foods) Liquids provided via: Cup;No straw Medication Administration: Whole meds with puree(for safer swallow) Supervision: Patient able to self feed;Intermittent supervision to cue for compensatory strategies Compensations: Minimize environmental distractions;Slow rate;Small sips/bites;Lingual sweep for clearance of pocketing;Multiple dry swallows after each bite/sip;Follow solids with liquid Postural Changes and/or Swallow  Maneuvers: Seated upright 90 degrees;Upright 30-60 min after meal;Out of bed for meals                General recommendations: (Outpt f/u) Oral Care Recommendations: Oral care BID;Oral care before and after PO;Patient independent with oral care(setup ) Follow up Recommendations: Outpatient SLP SLP Visit Diagnosis: Dysphagia, oropharyngeal phase (R13.12);Dysarthria and anarthria (R47.1) Plan: Continue with current plan of care;MBS(TBD; Outpt f/u)       GO                Leah Kenner, MS, CCC-SLP Harlym Gehling 02/08/2020, 3:19 PM

## 2020-02-12 ENCOUNTER — Ambulatory Visit (INDEPENDENT_AMBULATORY_CARE_PROVIDER_SITE_OTHER): Payer: BC Managed Care – PPO | Admitting: Internal Medicine

## 2020-02-12 ENCOUNTER — Encounter: Payer: Self-pay | Admitting: Internal Medicine

## 2020-02-12 ENCOUNTER — Other Ambulatory Visit: Payer: Self-pay

## 2020-02-12 VITALS — BP 126/84 | HR 88 | Temp 98.8°F | Ht 65.0 in | Wt 240.0 lb

## 2020-02-12 DIAGNOSIS — I69322 Dysarthria following cerebral infarction: Secondary | ICD-10-CM

## 2020-02-12 DIAGNOSIS — M1711 Unilateral primary osteoarthritis, right knee: Secondary | ICD-10-CM

## 2020-02-12 DIAGNOSIS — C73 Malignant neoplasm of thyroid gland: Secondary | ICD-10-CM

## 2020-02-12 DIAGNOSIS — R32 Unspecified urinary incontinence: Secondary | ICD-10-CM

## 2020-02-12 DIAGNOSIS — I639 Cerebral infarction, unspecified: Secondary | ICD-10-CM | POA: Diagnosis not present

## 2020-02-12 DIAGNOSIS — M170 Bilateral primary osteoarthritis of knee: Secondary | ICD-10-CM | POA: Insufficient documentation

## 2020-02-12 NOTE — Progress Notes (Signed)
Date:  02/12/2020   Name:  Leah Rogers   DOB:  04/14/1961   MRN:  [id number]   Chief Complaint: Cerebrovascular Accident (Follow up from the [facility]. Husband Leah Rogers with pt today. Pt has unstable gait when walking back today. )  Admitted to [facility] with CVA.  02/06/20 to 02/08/20. Discharge Diagnosis:   Principal Problem:   CVA (cerebral vascular accident) [facility]) Active Problems:   S/P total thyroidectomy   Acute CVA (cerebrovascular accident) [facility])  Hospital Course:   Ms. Leah Giannuzzi Mooreis an 59 y.o.femalefemalewith medical history significant forarthritis, thyroid cancer who presentedto the emergency room for evaluation of sudden onset right-sided weakness and numbness with associatedrightfacial droop. Work-up revealedacute left basal ganglia infarct.  She was seen in consultation by the neurologist.  She was previously taking low-dose aspirin but this was increased to 325 mg daily at the recommendation of the neurologist.  She was also evaluated by PT, OT and ST.  Outpatient physical therapy was recommended.  Dysphagia 3 diet was recommended by the speech therapist.  Her speech is still slurred although it has improved.  Weakness in right upper extremity has improved.  She is deemed stable for discharge to home today.  Since being home she has continued to improve.  Her right arm is still slightly weak but she is able to write again but not as well.  Her speech and right sided facial weakness is improved.   She was supposed to get outpatient PT but this was never ordered - at least the family has not been called. 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  Her husband thinks she sleeps too much during the day. She continues to have pain in the right knee from OA - treated with Aleve 2 tabs bid. She is taking Aspirin and Lipitor without side effects.  Lab Results  Component Value Date   CREATININE 0.80 02/06/2020   BUN 23 (H) 02/06/2020   NA 137  02/06/2020   K 3.8 02/06/2020   CL 101 02/06/2020   CO2 25 02/06/2020   Lab Results  Component Value Date   CHOL 203 (H) 02/07/2020   HDL 52 02/07/2020   LDLCALC 141 (H) 02/07/2020   TRIG 51 02/07/2020   CHOLHDL 3.9 02/07/2020   Lab Results  Component Value Date   TSH 1.125 03/09/2019   Lab Results  Component Value Date   HGBA1C 5.8 (H) 02/07/2020   Lab Results  Component Value Date   WBC 6.5 02/06/2020   HGB 13.6 02/06/2020   HCT 39.3 02/06/2020   MCV 90.8 02/06/2020   PLT 195 02/06/2020   Lab Results  Component Value Date   ALT 21 02/06/2020   AST 23 02/06/2020   ALKPHOS 75 02/06/2020   BILITOT 0.4 02/06/2020     Review of Systems  Constitutional: Negative for chills, fatigue, fever and unexpected weight change.  HENT: Positive for voice change (voice is softer). Negative for trouble swallowing.   Respiratory: Negative for chest tightness, shortness of breath and wheezing.   Cardiovascular: Negative for chest pain and palpitations.  Genitourinary: Positive for difficulty urinating (unable to control urine flow).  Musculoskeletal: Positive for arthralgias.  Neurological: Positive for weakness (right face and right arm). Negative for dizziness, seizures, light-headedness, numbness and headaches.  Psychiatric/Behavioral: Positive for sleep disturbance. Negative for dysphoric mood. The patient is not nervous/anxious.     Patient Active Problem List   Diagnosis Date Noted  . Acute  CVA (cerebrovascular accident) ([place]) 02/06/2020  . Screen for colon cancer 04/01/2017  . S/P total thyroidectomy 10/14/2015  . Papillary thyroid carcinoma ([place]) 10/01/2015  . Osteoarthritis of left knee 09/02/2015  . Hidradenitis axillaris 09/02/2015    No Known Allergies  Past Surgical History:  Procedure Laterality Date  . COLONOSCOPY WITH PROPOFOL N/A 03/20/2016   Procedure: COLONOSCOPY WITH PROPOFOL;  Surgeon: Lucilla Lame, MD;  Location: [place];  Service:  Endoscopy;  Laterality: N/A;  . ENDOMETRIAL BIOPSY  2013   benign- post menopausal bleeding  . THYROIDECTOMY N/A 10/14/2015   Procedure: THYROIDECTOMY;  Surgeon: Margaretha Sheffield, MD;  Location: [facility];  Service: ENT;  Laterality: N/A;  . TUBAL LIGATION      Social History   Tobacco Use  . Smoking status: Former Smoker    Packs/day: 0.50    Years: 18.00    Pack years: 9.00    Types: Cigarettes    Quit date: 03/21/2005    Years since quitting: 14.9  . Smokeless tobacco: Never Used  Substance Use Topics  . Alcohol use: No    Alcohol/week: 0.0 standard drinks  . Drug use: No     Medication list has been reviewed and updated.  Current Meds  Medication Sig  . aspirin EC 325 MG EC tablet Take 1 tablet (325 mg total) by mouth daily.  Marland Kitchen atorvastatin (LIPITOR) 80 MG tablet Take 1 tablet (80 mg total) by mouth daily.  . calcium gluconate 500 MG tablet Take 500 mg by mouth daily.   . cholecalciferol (VITAMIN D3) 25 MCG (1000 UNIT) tablet Take 1,000 Units by mouth daily.  . ferrous sulfate 325 (65 FE) MG tablet Take 325 mg by mouth daily with breakfast.  . levothyroxine (SYNTHROID, LEVOTHROID) 125 MCG tablet Take 125 mcg by mouth daily before breakfast.     PHQ 2/9 Scores 02/12/2020 06/01/2019  PHQ - 2 Score 0 0  PHQ- 9 Score 0 -    BP Readings from Last 3 Encounters:  02/12/20 126/84  02/08/20 (!) 154/98  06/01/19 128/78    Physical Exam Constitutional:      Appearance: Normal appearance.  Cardiovascular:     Rate and Rhythm: Normal rate and regular rhythm.     Pulses: Normal pulses.     Heart sounds: No murmur.  Pulmonary:     Effort: Pulmonary effort is normal.     Breath sounds: Normal breath sounds. No wheezing or rhonchi.  Musculoskeletal:        General: No swelling. Normal range of motion.     Cervical back: Normal range of motion.     Right knee: Bony tenderness and crepitus present. No effusion.     Right lower leg: No edema.     Left lower leg: No edema.    Lymphadenopathy:     Cervical: No cervical adenopathy.  Skin:    General: Skin is warm.     Capillary Refill: Capillary refill takes less than 2 seconds.  Neurological:     Mental Status: She is alert.     Cranial Nerves: Dysarthria (mild) present.     Sensory: Sensation is intact.     Motor: No weakness (in RUE), tremor or seizure activity.     Gait: Gait is intact.     Deep Tendon Reflexes:     Reflex Scores:      Tricep reflexes are 2+ on the right side and 2+ on the left side.      Patellar reflexes are  2+ on the right side and 2+ on the left side.    Comments: Very slight right facial weakness Tongue is midline   Psychiatric:        Attention and Perception: Attention normal.        Mood and Affect: Mood normal.     Wt Readings from Last 3 Encounters:  02/12/20 240 lb (108.9 kg)  02/06/20 240 lb (108.9 kg)  06/01/19 252 lb (114.3 kg)    BP 126/84   Pulse 88   Temp 98.8 F (37.1 C) (Oral)   Ht 5\' 5"  (1.651 m)   Wt 240 lb (108.9 kg)   SpO2 97%   BMI 39.94 kg/m   Assessment and Plan: 1. Cerebrovascular accident (CVA) with involvement of right side of body ([employer]) Now on Aspirin 325 mg daily and Lipitor per Neurology Refer to Neurology for follow up Also could benefit from home PT Sleep is disrupted due to shift work Pt will bring FMLA - will complete for one month - Ambulatory referral to Neurology - Ambulatory referral to Physical Therapy  2. Dysarthria due to recent stroke Much improved per report and husbands observation  3. Primary osteoarthritis of right knee Continue Aleve bid  4. Papillary thyroid carcinoma (HCC) Followed by Dr. Kathyrn Sheriff  5. Urinary incontinence in female Continue depends Recommend q2 hours toileting If no improvement in the next week, would refer to Urology   Partially dictated using Dragon software. Any errors are unintentional.  Halina Maidens, MD [facility]  02/12/2020

## 2020-02-26 DIAGNOSIS — I639 Cerebral infarction, unspecified: Secondary | ICD-10-CM | POA: Diagnosis not present

## 2020-03-05 ENCOUNTER — Other Ambulatory Visit: Payer: Self-pay | Admitting: Internal Medicine

## 2020-03-05 MED ORDER — ATORVASTATIN CALCIUM 80 MG PO TABS: 80.0000 mg | ORAL_TABLET | Freq: Every day | ORAL | 1 refills | Status: DC

## 2020-03-05 NOTE — Telephone Encounter (Signed)
Relation to pt: self  Call back number: [phone]  Pharmacy: [facility], [address] [place] Phone:  [phone]  Fax:  [phone]       Reason for call:  Patient requesting atorvastatin (LIPITOR) 80 MG tablet , informed patient please allow 48 to 72 hour turn around time

## 2020-03-05 NOTE — Telephone Encounter (Signed)
Requested medication (s) are due for refill today: yes  Requested medication (s) are on the active medication list: yes  Last refill: 02/09/2020  Future visit scheduled: yes  Notes to clinic:  looks like medication was prescribed in the ED Please review for refill   Requested Prescriptions  Pending Prescriptions Disp Refills   atorvastatin (LIPITOR) 80 MG tablet 30 tablet 0    Sig: Take 1 tablet (80 mg total) by mouth daily.      Cardiovascular:  Antilipid - Statins Failed - 03/05/2020 10:22 AM      Failed - Total Cholesterol in normal range and within 360 days    Cholesterol, Total  Date Value Ref Range Status  01/02/2016 191 100 - 199 mg/dL Final   Cholesterol  Date Value Ref Range Status  02/07/2020 203 (H) 0 - 200 mg/dL Final          Failed - LDL in normal range and within 360 days    LDL Calculated  Date Value Ref Range Status  01/02/2016 118 (H) 0 - 99 mg/dL Final   LDL Cholesterol  Date Value Ref Range Status  02/07/2020 141 (H) 0 - 99 mg/dL Final    Comment:           Total Cholesterol/HDL:CHD Risk Coronary Heart Disease Risk Table                     Men   Women  1/2 Average Risk   3.4   3.3  Average Risk       5.0   4.4  2 X Average Risk   9.6   7.1  3 X Average Risk  23.4   11.0        Use the calculated Patient Ratio above and the CHD Risk Table to determine the patient's CHD Risk.        ATP III CLASSIFICATION (LDL):  <100     mg/dL   Optimal  100-129  mg/dL   Near or Above                    Optimal  130-159  mg/dL   Borderline  160-189  mg/dL   High  >190     mg/dL   Very High Performed at [facility], [address] [place], [place] [address]           Passed - HDL in normal range and within 360 days    HDL  Date Value Ref Range Status  02/07/2020 52 >40 mg/dL Final  01/02/2016 58 >39 mg/dL Final          Passed - Triglycerides in normal range and within 360 days    Triglycerides  Date Value Ref Range Status   02/07/2020 51 <150 mg/dL Final          Passed - Patient is not pregnant      Passed - Valid encounter within last 12 months    Recent Outpatient Visits           3 weeks ago Cerebrovascular accident (CVA) with involvement of right side of body [facility])   [facility] Glean Hess, MD   9 months ago Pre-op evaluation   [facility] Glean Hess, MD   3 years ago Osteoarthritis of both knees, unspecified osteoarthritis type   [facility] Glean Hess, MD   4 years ago Annual physical exam   [facility]  Glean Hess, MD   4 years ago Thyroid nodule   [facility] Glean Hess, MD       Future Appointments             In 1 week [facility] Herbert Seta, MD [place]   In 3 months Glean Hess, MD [facility]

## 2020-03-12 ENCOUNTER — Encounter: Payer: Self-pay | Admitting: Urology

## 2020-03-12 ENCOUNTER — Other Ambulatory Visit: Payer: Self-pay | Admitting: *Deleted

## 2020-03-12 ENCOUNTER — Ambulatory Visit (INDEPENDENT_AMBULATORY_CARE_PROVIDER_SITE_OTHER): Payer: BC Managed Care – PPO | Admitting: Urology

## 2020-03-12 ENCOUNTER — Other Ambulatory Visit
Admission: EM | Admit: 2020-03-12 | Discharge: 2020-03-12 | Disposition: A | Discharge status: Home / Self Care | Payer: BC Managed Care – PPO | Attending: Urology | Admitting: Urology

## 2020-03-12 ENCOUNTER — Other Ambulatory Visit: Payer: Self-pay

## 2020-03-12 VITALS — BP 149/94 | HR 74 | Ht 65.0 in | Wt 243.0 lb

## 2020-03-12 DIAGNOSIS — R32 Unspecified urinary incontinence: Secondary | ICD-10-CM

## 2020-03-12 DIAGNOSIS — B379 Candidiasis, unspecified: Secondary | ICD-10-CM | POA: Diagnosis not present

## 2020-03-12 LAB — URINALYSIS, COMPLETE (UACMP) WITH MICROSCOPIC
Bilirubin Urine: NEGATIVE
Glucose, UA: NEGATIVE mg/dL
Ketones, ur: NEGATIVE mg/dL
Nitrite: NEGATIVE
Specific Gravity, Urine: 1.025 (ref 1.005–1.030)
pH: 6.5 (ref 5.0–8.0)

## 2020-03-12 LAB — BLADDER SCAN AMB NON-IMAGING: Scan Result: 0

## 2020-03-12 MED ORDER — FLUCONAZOLE 100 MG PO TABS
200.0000 mg | ORAL_TABLET | Freq: Every day | ORAL | 0 refills | Status: AC
Start: 2020-03-12 — End: 2020-03-26

## 2020-03-12 NOTE — Progress Notes (Signed)
[facility]  [address] [place], [place] [address] Phone: [phone]  Fax: [phone]   Clinic Day:  03/13/2020  Referring physician: Glean Hess, MD  Chief Complaint: Leah Rogers is a 59 y.o. female with stage II papillary thyroid carcinoma who is seen for a 1 year assessment.  HPI:   The patient was last seen in the medical oncology clinic as a televisit on 03/15/2019. At that time, she felt fine. She noted knee pain secondary to arthritis. Labs on 03/09/2019 revealed Hematocrit was 37.7, hemoglobin 12.8, platelets 171,000, WBC 4,800. Potassium was 3.3. Calcium was 8.3. Thyroglobulin was <2.0. Thyroglobulin antibody was <1.0. Free T4 was 0.82. TSH was 1.125.  Screening mammogram on 04/05/2019 revealed no mammographic evidence of malignancy.  The patient had a left total knee arthroplasty on 07/10/2019.  The patient was admitted from 02/06/2020-02/17/2020 to [facility] for a CVA. She presented with sudden onset right-sided weakness and numbness with facial droop. Aspirin was increased to 325 mg daily and outpatient physical therapy was recommended.  02/06/2020 Imaging: Head CT revealed unchanged appearance of an indeterminate subcentimeter low-density focus within the paramedian right pons which could have reflected an old or recent pontine infarction. No acute demarcated cortical infarct was identified. Redemonstrated chronic infarct within the right basal ganglia and radiating white matter tracks. CTA neck revealed common and internal carotid arteries patent within the neck without measurable stenosis. Minimal mixed plaque within the right carotid bifurcation. The vertebral arteries were patent within the neck. Mild atherosclerotic narrowing at the origin of the left vertebral artery. CTA head revealed no intracranial large vessel occlusion or proximal high-grade arterial stenosis. Mild atherosclerotic narrowing of the M1 left middle  cerebral Artery. 2 mm anterior communicating artery aneurysm. MRI Head without contrast revealed acute small vessel infarct involving the left basal ganglia and adjacent white matter. Chronic infarcts and chronic microvascular ischemic changes. Echo revealed EF 50-55%.  The patient saw Dr Army Melia, on 02/12/2020. She reported right arm weakness, insomnia, and trouble with speech. She was referred to neurology and physical therapy.  The patient established care with Chipper Herb, NP, neurology, on 02/26/2020. She reported right arm weakness and difficult speaking. She was referred to speech therapy, cardiology, and urology. Aspirin was decreased to 81 mg daily. She was started on Plavix 75 mg daily. Will follow up in 3 months.  The patient saw Dr. Diamantina Providence on 03/12/2020 for urinary incontinence since her stroke. Urine was concerning for yeast infection. She was prescribed fluconazole and a trial of Myrbetriq. Will follow up in 6-8 weeks.  During the interim, she has been "alright." She has right sided arm and leg weakness s/p stroke. Her speech is not back to normal and her energy level is "alright." She has not seen physical and occupational therapy yet.  She denies numbness, trouble swallowing, or bone pain.  Her synthroid dose is 125 mcg/day.   Past Medical History:  Diagnosis Date  . Arthritis   . Cancer ([place])    Thyroid  . Heart murmur   . History of CVA (cerebrovascular accident) 02/06/2020   02/06/20  . Infected sebaceous cyst 10/26/2018  . Thyroid disease     Past Surgical History:  Procedure Laterality Date  . COLONOSCOPY WITH PROPOFOL N/A 03/20/2016   Procedure: COLONOSCOPY WITH PROPOFOL;  Surgeon: Lucilla Lame, MD;  Location: [facility];  Service: Endoscopy;  Laterality: N/A;  . ENDOMETRIAL BIOPSY  2013   benign- post menopausal bleeding  . THYROIDECTOMY N/A  10/14/2015   Procedure: THYROIDECTOMY;  Surgeon: Margaretha Sheffield, MD;  Location: [facility];  Service: ENT;  Laterality:  N/A;  . TUBAL LIGATION      Family History  Problem Relation Age of Onset  . Hypertension Mother   . Lung cancer Mother   . Heart attack Father   . Heart attack Brother   . Breast cancer Neg Hx     Social History:  reports that she quit smoking about 14 years ago. Her smoking use included cigarettes. She has a 9.00 pack-year smoking history. She has never used smokeless tobacco. She reports that she does not drink alcohol and does not use drugs.  She lives in [place]. She works at [employer] in [place], [place].  The patient is alone today.   Allergies: No Known Allergies  Current Medications: Current Outpatient Medications  Medication Sig Dispense Refill  . aspirin EC 325 MG EC tablet Take 1 tablet (325 mg total) by mouth daily.    Marland Kitchen atorvastatin (LIPITOR) 80 MG tablet Take 1 tablet (80 mg total) by mouth daily. 90 tablet 1  . calcium gluconate 500 MG tablet Take 500 mg by mouth daily.     . cholecalciferol (VITAMIN D3) 25 MCG (1000 UNIT) tablet Take 1,000 Units by mouth daily.    . ferrous sulfate 325 (65 FE) MG tablet Take 325 mg by mouth daily with breakfast.    . levothyroxine (SYNTHROID, LEVOTHROID) 125 MCG tablet Take 125 mcg by mouth daily before breakfast.     . fluconazole (DIFLUCAN) 100 MG tablet Take 2 tablets (200 mg total) by mouth daily for 14 days. X 7 days (Patient not taking: Reported on 03/13/2020) 28 tablet 0   No current facility-administered medications for this visit.     Review of Systems  Constitutional: Positive for malaise/fatigue. Negative for chills, diaphoresis, fever and weight loss.       Feels "alright."  HENT: Negative for hearing loss, nosebleeds, sinus pain, sore throat and tinnitus.        Difficulty with speech s/p stroke  Eyes: Negative for blurred vision.  Respiratory: Negative for cough, hemoptysis and shortness of breath.   Cardiovascular: Negative for chest pain, palpitations and leg swelling.  Gastrointestinal: Negative for abdominal pain,  blood in stool, constipation, diarrhea, heartburn, melena, nausea and vomiting.  Genitourinary: Negative for dysuria, frequency, hematuria and urgency.  Musculoskeletal: Negative for back pain, joint pain, myalgias and neck pain.  Skin: Negative for itching and rash.  Neurological: Positive for weakness (s/p stroke). Negative for dizziness, tingling, sensory change and headaches.  Endo/Heme/Allergies: Does not bruise/bleed easily.  Psychiatric/Behavioral: Negative for depression and memory loss. The patient is not nervous/anxious and does not have insomnia.   All other systems reviewed and are negative.   Performance status (ECOG): 1  Vital Signs: Blood pressure (!) 145/90, pulse 76, temperature 97.9 F (36.6 C), resp. rate 18, height 5\' 5"  (1.651 m), weight 244 lb 0.8 oz (110.7 kg), SpO2 98 %.  Physical Exam Constitutional:      General: She is not in acute distress.    Appearance: She is well-developed. She is not diaphoretic.  HENT:     Head: Normocephalic and atraumatic.     Comments: Short dark hair.  Mask.    Mouth/Throat:     Mouth: Mucous membranes are moist.     Pharynx: Oropharynx is clear.  Eyes:     General: No scleral icterus.    Extraocular Movements: Extraocular movements intact.  Conjunctiva/sclera: Conjunctivae normal.     Pupils: Pupils are equal, round, and reactive to light.     Comments: Glasses.  Brown eyes.  Neck:     Thyroid: No thyroid mass or thyromegaly.  Cardiovascular:     Rate and Rhythm: Normal rate and regular rhythm.     Pulses: Normal pulses.     Heart sounds: Normal heart sounds. No murmur heard.   Pulmonary:     Effort: Pulmonary effort is normal. No respiratory distress.     Breath sounds: Normal breath sounds. No wheezing or rales.  Chest:     Chest wall: No tenderness.  Abdominal:     General: Bowel sounds are normal. There is no distension.     Palpations: Abdomen is soft. There is no mass.     Tenderness: There is no abdominal  tenderness. There is no guarding or rebound.     Hernia: No hernia is present.  Musculoskeletal:        General: No swelling or tenderness. Normal range of motion.     Cervical back: Normal range of motion.  Lymphadenopathy:     Head:     Right side of head: No preauricular, posterior auricular or occipital adenopathy.     Left side of head: No preauricular, posterior auricular or occipital adenopathy.     Cervical: No cervical adenopathy.     Upper Body:     Right upper body: No supraclavicular or axillary adenopathy.     Left upper body: No supraclavicular or axillary adenopathy.     Lower Body: No right inguinal adenopathy. No left inguinal adenopathy.  Skin:    General: Skin is warm and dry.  Neurological:     Mental Status: She is alert and oriented to person, place, and time. Mental status is at baseline.     Motor: Weakness (subtle right sided) present.     Comments: Change in speech.  Psychiatric:        Behavior: Behavior normal.        Thought Content: Thought content normal.        Judgment: Judgment normal.    Appointment on 03/13/2020  Component Date Value Ref Range Status  . Sodium 03/13/2020 140  135 - 145 mmol/L Final  . Potassium 03/13/2020 3.7  3.5 - 5.1 mmol/L Final  . Chloride 03/13/2020 104  98 - 111 mmol/L Final  . CO2 03/13/2020 27  22 - 32 mmol/L Final  . Glucose, Bld 03/13/2020 77  70 - 99 mg/dL Final   Glucose reference range applies only to samples taken after fasting for at least 8 hours.  . BUN 03/13/2020 20  6 - 20 mg/dL Final  . Creatinine, Ser 03/13/2020 0.83  0.44 - 1.00 mg/dL Final  . Calcium 03/13/2020 8.8* 8.9 - 10.3 mg/dL Final  . Total Protein 03/13/2020 8.3* 6.5 - 8.1 g/dL Final  . Albumin 03/13/2020 3.8  3.5 - 5.0 g/dL Final  . AST 03/13/2020 21  15 - 41 U/L Final  . ALT 03/13/2020 26  0 - 44 U/L Final  . Alkaline Phosphatase 03/13/2020 82  38 - 126 U/L Final  . Total Bilirubin 03/13/2020 0.4  0.3 - 1.2 mg/dL Final  . GFR calc non Af  Amer 03/13/2020 >60  >60 mL/min Final  . GFR calc Af Amer 03/13/2020 >60  >60 mL/min Final  . Anion gap 03/13/2020 9  5 - 15 Final   Performed at [facility], [address],  [place], [place] [address]  . WBC 03/13/2020 5.4  4.0 - 10.5 K/uL Final  . RBC 03/13/2020 4.44  3.87 - 5.11 MIL/uL Final  . Hemoglobin 03/13/2020 13.6  12.0 - 15.0 g/dL Final  . HCT 03/13/2020 41.0  36 - 46 % Final  . MCV 03/13/2020 92.3  80.0 - 100.0 fL Final  . MCH 03/13/2020 30.6  26.0 - 34.0 pg Final  . MCHC 03/13/2020 33.2  30.0 - 36.0 g/dL Final  . RDW 03/13/2020 14.6  11.5 - 15.5 % Final  . Platelets 03/13/2020 185  150 - 400 K/uL Final  . nRBC 03/13/2020 0.0  0.0 - 0.2 % Final  . Neutrophils Relative % 03/13/2020 63  % Final  . Neutro Abs 03/13/2020 3.4  1.7 - 7.7 K/uL Final  . Lymphocytes Relative 03/13/2020 19  % Final  . Lymphs Abs 03/13/2020 1.0  0.7 - 4.0 K/uL Final  . Monocytes Relative 03/13/2020 17  % Final  . Monocytes Absolute 03/13/2020 0.9  0 - 1 K/uL Final  . Eosinophils Relative 03/13/2020 1  % Final  . Eosinophils Absolute 03/13/2020 0.1  0 - 0 K/uL Final  . Basophils Relative 03/13/2020 0  % Final  . Basophils Absolute 03/13/2020 0.0  0 - 0 K/uL Final  . Immature Granulocytes 03/13/2020 0  % Final  . Abs Immature Granulocytes 03/13/2020 0.02  0.00 - 0.07 K/uL Final   Performed at [facility], [address], [place], [place] [address]  Hospital Outpatient Visit on 03/12/2020  Component Date Value Ref Range Status  . Color, Urine 03/12/2020 YELLOW  YELLOW Final  . APPearance 03/12/2020 HAZY* CLEAR Final  . Specific Gravity, Urine 03/12/2020 1.025  1.005 - 1.030 Final  . pH 03/12/2020 6.5  5.0 - 8.0 Final  . Glucose, UA 03/12/2020 NEGATIVE  NEGATIVE mg/dL Final  . Hgb urine dipstick 03/12/2020 TRACE* NEGATIVE Final  . Bilirubin Urine 03/12/2020 NEGATIVE  NEGATIVE Final  . Ketones, ur 03/12/2020 NEGATIVE  NEGATIVE mg/dL Final  . Protein, ur 03/12/2020 TRACE*  NEGATIVE mg/dL Final  . Nitrite 03/12/2020 NEGATIVE  NEGATIVE Final  . Chalmers Guest 03/12/2020 MODERATE* NEGATIVE Final  . Squamous Epithelial / LPF 03/12/2020 0-5  0 - 5 Final  . WBC, UA 03/12/2020 11-20  0 - 5 WBC/hpf Final  . RBC / HPF 03/12/2020 0-5  0 - 5 RBC/hpf Final  . Bacteria, UA 03/12/2020 FEW* NONE SEEN Final  . Budding Yeast 03/12/2020 PRESENT   Final  . Edwina Barth 03/12/2020 LESS THAN 10 mL OF URINE SUBMITTED   Final   Performed at [facility], [address], [place], [place] [address]  . Specimen Description 03/12/2020    Final                   Value:URINE, CLEAN CATCH Performed at [facility], [address], [place], [place] [address]   . Special Requests 03/12/2020    Final                   Value:NONE Performed at [facility], [address], [place], [place] [address]   . Culture 03/12/2020 MULTIPLE SPECIES PRESENT, SUGGEST RECOLLECTION*  Final  . Report Status 03/12/2020 03/13/2020 FINAL   Final  Office Visit on 03/12/2020  Component Date Value Ref Range Status  . Scan Result 03/12/2020 0 ml   Final    Assessment:  Leah Rogers is a 59 y.o. female with stage II  papillary thyroid carcinoma s/p total thyroidectomy on 10/14/2015.  Pathology revealed papillary thyroid carcinoma, focally cystic.  There was a 3.5 cm follicular adenomatoid nodule in the right lobe.  There was a 0.3 cm calcified follicular nodule in the superior left lobe.  Pathologic stage was pT2Nx.  PET scan on 11/07/2015 revealed postsurgical changes identified within the thyroid bed.  There were no specific findings identified to suggest FDG avid thyroid cancer metastases.  She received RAI (70.2 mCi I-131) with Thyrogen stimulation on 11/18/2015.  Whole body I-131 scan on 11/28/2015 revealed I-131 uptake with thyroid remnant.  Otherwise negative whole-body I-131 scan without evidence of iodine-avid metastatic disease.  Bilateral screening mammogram on  04/05/2019 revealed no evidence of malignancy.  The patient was admitted to [facility] from 02/06/2020-02/17/2020 with a CVA. She presented with sudden onset right-sided weakness and numbness with facial droop.  Symptomatically, she has generalized weakness s/p stroke.  She has not started physical therapy.  Exam reveals no adenopathy.  Plan: 1.   Labs today: CBC with diff, CMP, TSH, free T4, thyroglobulin, thyroglobulin antibody 2.   Stage II papillary thyroid carcinoma  Patient is s/p thyroidectomy on 10/14/2015.    She received radioactive iodine on  11/18/2015.    She is currently on Synthroid 125 mcg a day.  Await testing today for any adjustment in Synthroid. 3.   Heath maintenenace  Bilateral screening mammogram on 04/05/2019 revealed no evidence of malignancy.  Anticipate yearly mammogram in 03/2020. 4.   CVA with residual weakness and speech difficulty  Anticipate physical therapy and speech therapy.  Follow-up with Dr. Army Melia. 5.   RTC in 6 months for labs (TSH and free T4).   6.   RTC in 1 year for MD assessment, labs (CBC with diff, CMP, TSH, free T4, thyroglobulin, thyroglobulin antibody).    I discussed the assessment and treatment plan with the patient.  The patient was provided an opportunity to ask questions and all were answered.  The patient agreed with the plan and demonstrated an understanding of the instructions.  The patient was advised to call back or seek an in person evaluation if the symptoms worsen or if the condition fails to improve as anticipated.   Nolon Stalls, MD, PhD  03/13/2020, 3:27 PM  I, De Burrs, am acting as scribe for [employer]. Mike Gip, MD, PhD.  I, Samel Bruna C. Mike Gip, MD, have reviewed the above documentation for accuracy and completeness, and I agree with the above.

## 2020-03-12 NOTE — Patient Instructions (Signed)
1. Urinate every 2-3 hours during the day on a schedule, and right before bed 2. Avoid sodas/tea/gatorade, and especially diet drinks as these can irritate the bladder 3. It can take ~3 months after a stroke for the bladder to go back to normal   Overactive Bladder, Adult  Overactive bladder refers to a condition in which a person has a sudden need to pass urine. The person may leak urine if he or she cannot get to the bathroom fast enough (urinary incontinence). A person with this condition may also wake up several times in the night to go to the bathroom. Overactive bladder is associated with poor nerve signals between your bladder and your brain. Your bladder may get the signal to empty before it is full. You may also have very sensitive muscles that make your bladder squeeze too soon. These symptoms might interfere with daily work or social activities. What are the causes? This condition may be associated with or caused by:  Urinary tract infection.  Infection of nearby tissues, such as the prostate.  Prostate enlargement.  Surgery on the uterus or urethra.  Bladder stones, inflammation, or tumors.  Drinking too much caffeine or alcohol.  Certain medicines, especially medicines that get rid of extra fluid in the body (diuretics).  Muscle or nerve weakness, especially from: ? A spinal cord injury. ? Stroke. ? Multiple sclerosis. ? Parkinson's disease.  Diabetes.  Constipation. What increases the risk? You may be at greater risk for overactive bladder if you:  Are an older adult.  Smoke.  Are going through menopause.  Have prostate problems.  Have a neurological disease, such as stroke, dementia, Parkinson's disease, or multiple sclerosis (MS).  Eat or drink things that irritate the bladder. These include alcohol, spicy food, and caffeine.  Are overweight or obese. What are the signs or symptoms? Symptoms of this condition include:  Sudden, strong urge to  urinate.  Leaking urine.  Urinating 8 or more times a day.  Waking up to urinate 2 or more times a night. How is this diagnosed? Your health care provider may suspect overactive bladder based on your symptoms. He or she will diagnose this condition by:  A physical exam and medical history.  Blood or urine tests. You might need bladder or urine tests to help determine what is causing your overactive bladder. You might also need to see a health care provider who specializes in urinary tract problems (urologist). How is this treated? Treatment for overactive bladder depends on the cause of your condition and whether it is mild or severe. You can also make lifestyle changes at home. Options include:  Bladder training. This may include: ? Learning to control the urge to urinate by following a schedule that directs you to urinate at regular intervals (timed voiding). ? Doing Kegel exercises to strengthen your pelvic floor muscles, which support your bladder. Toning these muscles can help you control urination, even if your bladder muscles are overactive.  Special devices. This may include: ? Biofeedback, which uses sensors to help you become aware of your body's signals. ? Electrical stimulation, which uses electrodes placed inside the body (implanted) or outside the body. These electrodes send gentle pulses of electricity to strengthen the nerves or muscles that control the bladder. ? Women may use a plastic device that fits into the vagina and supports the bladder (pessary).  Medicines. ? Antibiotics to treat bladder infection. ? Antispasmodics to stop the bladder from releasing urine at the wrong time. ? Tricyclic  antidepressants to relax bladder muscles. ? Injections of botulinum toxin type A directly into the bladder tissue to relax bladder muscles.  Lifestyle changes. This may include: ? Weight loss. Talk to your health care provider about weight loss methods that would work best for  you. ? Diet changes. This may include reducing how much alcohol and caffeine you consume, or drinking fluids at different times of the day. ? Not smoking. Do not use any products that contain nicotine or tobacco, such as cigarettes and e-cigarettes. If you need help quitting, ask your health care provider.  Surgery. ? A device may be implanted to help manage the nerve signals that control urination. ? An electrode may be implanted to stimulate electrical signals in the bladder. ? A procedure may be done to change the shape of the bladder. This is done only in very severe cases. Follow these instructions at home: Lifestyle  Make any diet or lifestyle changes that are recommended by your health care provider. These may include: ? Drinking less fluid or drinking fluids at different times of the day. ? Cutting down on caffeine or alcohol. ? Doing Kegel exercises. ? Losing weight if needed. ? Eating a healthy and balanced diet to prevent constipation. This may include:  Eating foods that are high in fiber, such as fresh fruits and vegetables, whole grains, and beans.  Limiting foods that are high in fat and processed sugars, such as fried and sweet foods. General instructions  Take over-the-counter and prescription medicines only as told by your health care provider.  If you were prescribed an antibiotic medicine, take it as told by your health care provider. Do not stop taking the antibiotic even if you start to feel better.  Use any implants or pessary as told by your health care provider.  If needed, wear pads to absorb urine leakage.  Keep a journal or log to track how much and when you drink and when you feel the need to urinate. This will help your health care provider monitor your condition.  Keep all follow-up visits as told by your health care provider. This is important. Contact a health care provider if:  You have a fever.  Your symptoms do not get better with  treatment.  Your pain and discomfort get worse.  You have more frequent urges to urinate. Get help right away if:  You are not able to control your bladder. Summary  Overactive bladder refers to a condition in which a person has a sudden need to pass urine.  Several conditions may lead to an overactive bladder.  Treatment for overactive bladder depends on the cause and severity of your condition.  Follow your health care provider's instructions about lifestyle changes, doing Kegel exercises, keeping a journal, and taking medicines. This information is not intended to replace advice given to you by your health care provider. Make sure you discuss any questions you have with your health care provider. Document Revised: 12/29/2018 Document Reviewed: 09/23/2017 Elsevier Patient Education  [place].

## 2020-03-12 NOTE — Progress Notes (Signed)
03/12/20 8:47 AM   [name] 01/08/1961 [id number]  CC: Incontinence  HPI: I saw Ms. Leah Rogers today in urology clinic for eval of urinary incontinence.  She is a 59 year old African-American female with history of recent stroke on 02/06/2020 with residual right-sided weakness.  She denies any urinary symptoms prior to her stroke.  She reports leakage of urine that she is unaware of since the stroke, as well as some urgency and urge incontinence.  She has new nocturia 1-2 times per night.  She denies any stress incontinence.  She denies any dysuria, gross hematuria, fevers or chills.  She has also had some trouble with stool leakage as well since her stroke.  She denies any history of recurrent UTIs or history of retention.  No prior cross-sectional imaging to review.  Normal renal function. She drinks soda and gatorade.  Urinalysis today with yeast, 11-20 WBCs, few bacteria, 0 RBCs, moderate leukocytes, nitrite negative.  PVR 0 mL.   PMH: Past Medical History:  Diagnosis Date  . Arthritis   . Cancer ([place])    Thyroid  . Heart murmur   . History of CVA (cerebrovascular accident) 02/06/2020   02/06/20  . Infected sebaceous cyst 10/26/2018  . Thyroid disease     Surgical History: Past Surgical History:  Procedure Laterality Date  . COLONOSCOPY WITH PROPOFOL N/A 03/20/2016   Procedure: COLONOSCOPY WITH PROPOFOL;  Surgeon: Lucilla Lame, MD;  Location: [place];  Service: Endoscopy;  Laterality: N/A;  . ENDOMETRIAL BIOPSY  2013   benign- post menopausal bleeding  . THYROIDECTOMY N/A 10/14/2015   Procedure: THYROIDECTOMY;  Surgeon: Margaretha Sheffield, MD;  Location: [facility];  Service: ENT;  Laterality: N/A;  . TUBAL LIGATION      Family History: Family History  Problem Relation Age of Onset  . Hypertension Mother   . Lung cancer Mother   . Heart attack Father   . Heart attack Brother   . Breast cancer Neg Hx     Social History:  reports that she quit smoking about 14  years ago. Her smoking use included cigarettes. She has a 9.00 pack-year smoking history. She has never used smokeless tobacco. She reports that she does not drink alcohol and does not use drugs.  Physical Exam: BP (!) 149/94   Pulse 74   Ht 5\' 5"  (1.651 m)   Wt 243 lb (110.2 kg)   BMI 40.44 kg/m    Constitutional:  Alert and oriented, No acute distress. Cardiovascular: No clubbing, cyanosis, or edema. Respiratory: Normal respiratory effort, no increased work of breathing. GI: Abdomen is soft, nontender, nondistended, no abdominal masses GU: No CVA tenderness  Laboratory Data: Reviewed, see HPI  Pertinent Imaging: None to review  Assessment & Plan:   In summary, she is a 59 year old female 1 month out from a stroke with residual right sided weakness who reports new urinary and fecal incontinence. Her urinary symptoms are leakage without realizing it, as well as some urgency. Urine today concerning for yeast infection, will send for culture.  We had a long conversation today about the relationship between CVA and urinary symptoms, and that this can take 2 to 3 months to improve.  We also discussed overactive bladder and its role in her urinary symptoms, as well as her likely yeast infection.  Fluconazole 200 mg x 2 weeks for yeast UTI, follow-up culture Timed voiding every 2-3 hours during the day Trial of Myrbetriq 50 mg daily x4 weeks, sample given RTC 6 to  8 weeks for PVR and symptom check  Nickolas Madrid, MD 03/12/2020  [facility] [address], [place], [place] [address] [phone]

## 2020-03-13 ENCOUNTER — Inpatient Hospital Stay: Payer: BC Managed Care – PPO | Attending: Hematology and Oncology | Admitting: Hematology and Oncology

## 2020-03-13 ENCOUNTER — Inpatient Hospital Stay: Payer: BC Managed Care – PPO

## 2020-03-13 ENCOUNTER — Encounter: Payer: Self-pay | Admitting: Hematology and Oncology

## 2020-03-13 VITALS — BP 145/90 | HR 76 | Temp 97.9°F | Resp 18 | Ht 65.0 in | Wt 244.0 lb

## 2020-03-13 DIAGNOSIS — Z7982 Long term (current) use of aspirin: Secondary | ICD-10-CM | POA: Diagnosis not present

## 2020-03-13 DIAGNOSIS — Z801 Family history of malignant neoplasm of trachea, bronchus and lung: Secondary | ICD-10-CM | POA: Diagnosis not present

## 2020-03-13 DIAGNOSIS — Z85850 Personal history of malignant neoplasm of thyroid: Secondary | ICD-10-CM | POA: Diagnosis not present

## 2020-03-13 DIAGNOSIS — Z8249 Family history of ischemic heart disease and other diseases of the circulatory system: Secondary | ICD-10-CM | POA: Insufficient documentation

## 2020-03-13 DIAGNOSIS — C73 Malignant neoplasm of thyroid gland: Secondary | ICD-10-CM

## 2020-03-13 DIAGNOSIS — Z8673 Personal history of transient ischemic attack (TIA), and cerebral infarction without residual deficits: Secondary | ICD-10-CM | POA: Insufficient documentation

## 2020-03-13 DIAGNOSIS — Z79899 Other long term (current) drug therapy: Secondary | ICD-10-CM | POA: Insufficient documentation

## 2020-03-13 DIAGNOSIS — Z87891 Personal history of nicotine dependence: Secondary | ICD-10-CM | POA: Diagnosis not present

## 2020-03-13 LAB — COMPREHENSIVE METABOLIC PANEL
ALT: 26 U/L (ref 0–44)
AST: 21 U/L (ref 15–41)
Albumin: 3.8 g/dL (ref 3.5–5.0)
Alkaline Phosphatase: 82 U/L (ref 38–126)
Anion gap: 9 (ref 5–15)
BUN: 20 mg/dL (ref 6–20)
CO2: 27 mmol/L (ref 22–32)
Calcium: 8.8 mg/dL — ABNORMAL LOW (ref 8.9–10.3)
Chloride: 104 mmol/L (ref 98–111)
Creatinine, Ser: 0.83 mg/dL (ref 0.44–1.00)
GFR calc Af Amer: >60 mL/min (ref >60)
GFR calc non Af Amer: >60 mL/min (ref >60)
Glucose, Bld: 77 mg/dL (ref 70–99)
Potassium: 3.7 mmol/L (ref 3.5–5.1)
Sodium: 140 mmol/L (ref 135–145)
Total Bilirubin: 0.4 mg/dL (ref 0.3–1.2)
Total Protein: 8.3 g/dL — ABNORMAL HIGH (ref 6.5–8.1)

## 2020-03-13 LAB — CBC WITH DIFFERENTIAL/PLATELET
Abs Immature Granulocytes: 0.02 10*3/uL (ref 0.00–0.07)
Basophils Absolute: 0 10*3/uL (ref 0.0–0.1)
Basophils Relative: 0 %
Eosinophils Absolute: 0.1 10*3/uL (ref 0.0–0.5)
Eosinophils Relative: 1 %
HCT: 41 % (ref 36.0–46.0)
Hemoglobin: 13.6 g/dL (ref 12.0–15.0)
Immature Granulocytes: 0 %
Lymphocytes Relative: 19 %
Lymphs Abs: 1 10*3/uL (ref 0.7–4.0)
MCH: 30.6 pg (ref 26.0–34.0)
MCHC: 33.2 g/dL (ref 30.0–36.0)
MCV: 92.3 fL (ref 80.0–100.0)
Monocytes Absolute: 0.9 10*3/uL (ref 0.1–1.0)
Monocytes Relative: 17 %
Neutro Abs: 3.4 10*3/uL (ref 1.7–7.7)
Neutrophils Relative %: 63 %
Platelets: 185 10*3/uL (ref 150–400)
RBC: 4.44 MIL/uL (ref 3.87–5.11)
RDW: 14.6 % (ref 11.5–15.5)
WBC: 5.4 10*3/uL (ref 4.0–10.5)
nRBC: 0 % (ref 0.0–0.2)

## 2020-03-13 LAB — URINE CULTURE

## 2020-03-13 NOTE — Progress Notes (Signed)
No new changes noted today 

## 2020-03-14 ENCOUNTER — Telehealth: Payer: Self-pay

## 2020-03-14 LAB — T4, FREE: Free T4: 0.76 ng/dL (ref 0.61–1.12)

## 2020-03-14 LAB — TSH: TSH: 22.39 u[IU]/mL — ABNORMAL HIGH (ref 0.350–4.500)

## 2020-03-14 NOTE — Telephone Encounter (Signed)
The patient labs has been routed to the PCP office.

## 2020-03-15 LAB — TGAB+THYROGLOBULIN IMA OR RIA: Thyroglobulin Antibody: <1 IU/mL (ref 0.0–0.9)

## 2020-03-15 LAB — THYROGLOBULIN BY IMA: Thyroglobulin by IMA: <0.1 ng/mL — ABNORMAL LOW (ref 1.5–38.5)

## 2020-03-18 ENCOUNTER — Telehealth: Payer: Self-pay | Admitting: Internal Medicine

## 2020-03-18 NOTE — Telephone Encounter (Signed)
She may need to ask Neurology since I have not seen her in a month.  I have no idea how she is doing.  It does seem that she has not yet had any ST or PT.  I have sent Amy a message to look into why these were not done and not follow up on from the referral team.

## 2020-03-18 NOTE — Telephone Encounter (Signed)
Copied from [place] [phone]. Topic: General - Other >> Mar 18, 2020  2:56 PM Hinda Lenis D wrote: Reason for CRM: PT has some questions about her FMLA / please advise

## 2020-03-19 DIAGNOSIS — I639 Cerebral infarction, unspecified: Secondary | ICD-10-CM | POA: Diagnosis not present

## 2020-03-19 NOTE — Telephone Encounter (Signed)
Referrals team staff message -> Leah Rogers  [place], [place], [place] Just spoke with [facility], the referral coordinator is on vacation and will give her a call next week to schedule   Spoke with patient and informed of this information. Dr Army Melia said she will extend her FMLA forms for another month but we need to get her in with ST and PT. Patient will call by the end of next week if she has not been scheduled through the rehab center.   CM

## 2020-03-27 ENCOUNTER — Telehealth: Payer: Self-pay | Admitting: Internal Medicine

## 2020-03-27 NOTE — Telephone Encounter (Unsigned)
Copied from [place] [id number]. Topic: General - Call Back - No Documentation >> Mar 27, 2020  4:18 PM Erick Blinks wrote: Reason for CRM: Pt called reporting that she has yet to hear from her PT referral, please advise  Best contact: [phone]

## 2020-03-28 NOTE — Telephone Encounter (Signed)
Called pt let her know that I Mindi Junker about her referral. She responded that [facility] is behind on scheduling that they will call her today. Pt stated that she has already received a phone call today.  KP

## 2020-04-03 ENCOUNTER — Encounter: Payer: Self-pay | Admitting: Physical Therapy

## 2020-04-03 ENCOUNTER — Encounter: Payer: Self-pay | Admitting: Speech Pathology

## 2020-04-03 ENCOUNTER — Ambulatory Visit: Payer: BC Managed Care – PPO | Attending: Internal Medicine | Admitting: Physical Therapy

## 2020-04-03 ENCOUNTER — Other Ambulatory Visit: Payer: Self-pay

## 2020-04-03 ENCOUNTER — Ambulatory Visit: Payer: BC Managed Care – PPO | Admitting: Speech Pathology

## 2020-04-03 DIAGNOSIS — M6281 Muscle weakness (generalized): Secondary | ICD-10-CM | POA: Diagnosis not present

## 2020-04-03 DIAGNOSIS — R471 Dysarthria and anarthria: Secondary | ICD-10-CM

## 2020-04-03 DIAGNOSIS — R262 Difficulty in walking, not elsewhere classified: Secondary | ICD-10-CM | POA: Insufficient documentation

## 2020-04-03 NOTE — Therapy (Signed)
[place] MAIN [facility] SERVICES [address] [place], [place], [address] Phone: [phone]   Fax:  [phone]  Physical Therapy Evaluation  Patient Details  Name: Leah Rogers MRN: [id number] Date of Birth: Jun 12, 1961 Referring Provider (PT): Halina Maidens MD    Encounter Date: 04/03/2020   PT End of Session - 04/03/20 0904    Visit Number 1    Number of Visits 9    Date for PT Re-Evaluation 05/01/20    Authorization Type BCBS plan    Authorization Time Period FOTO completed- PT    PT Start Time 0802    PT Stop Time 0855    PT Time Calculation (min) 53 min    Equipment Utilized During Treatment Gait belt    Activity Tolerance Patient tolerated treatment well    Behavior During Therapy Flat affect           Past Medical History:  Diagnosis Date  . Arthritis   . Cancer ([place])    Thyroid  . Heart murmur   . History of CVA (cerebrovascular accident) 02/06/2020   02/06/20  . Infected sebaceous cyst 10/26/2018  . Thyroid disease     Past Surgical History:  Procedure Laterality Date  . COLONOSCOPY WITH PROPOFOL N/A 03/20/2016   Procedure: COLONOSCOPY WITH PROPOFOL;  Surgeon: Lucilla Lame, MD;  Location: [place];  Service: Endoscopy;  Laterality: N/A;  . ENDOMETRIAL BIOPSY  2013   benign- post menopausal bleeding  . THYROIDECTOMY N/A 10/14/2015   Procedure: THYROIDECTOMY;  Surgeon: Margaretha Sheffield, MD;  Location: [facility];  Service: ENT;  Laterality: N/A;  . TUBAL LIGATION      There were no vitals filed for this visit.    Subjective Assessment - 04/03/20 0806    Subjective "My right arm and right leg are still a little weak."    Pertinent History 59 yo Female s/p CVA on 02/06/20 with acute onset of RUE weakness and facial droop. She was seen for PT in the hospital and then discharged home. She presents to therapy without any AD and states that she does not use them. She denies any numbness/tingling; She denies any recent  falls; She does ambulate with an antalgic gait and is s/p L TKA in October 2020 with good results. She does have OA in right knee but she is hoping to avoid surgery on the right side. PMH significant for thyroid cancer s/p thyroidectomy in 2017, levels controlled currently; also has heart murmur;    How long can you sit comfortably? pain in right knee after sitting a few hours;    How long can you stand comfortably? 1-2 hours    How long can you walk comfortably? no difficulty; unable to walk a mile due to right knee;    Diagnostic tests MRI on 02/06/20: Acute small vessel infarct involving the left basal ganglia and adjacent white matter;    Patient Stated Goals get stronger on the right side;    Currently in Pain? Yes    Pain Score 3     Pain Location Knee    Pain Orientation Right    Pain Descriptors / Indicators Aching;Sore    Pain Type Chronic pain    Pain Frequency Intermittent    Aggravating Factors  transfers/weight bearing;    Pain Relieving Factors rest    Effect of Pain on Daily Activities decreased activity tolerance;    Multiple Pain Sites No  [facility] PT Assessment - 04/03/20 0001      Assessment   Medical Diagnosis s/p CVA    Referring Provider (PT) Halina Maidens MD     Onset Date/Surgical Date 02/06/20    Hand Dominance Right    Next MD Visit June 14, 2020    Prior Therapy had acute care while in hospital; otherwise no rehab for this condition      Precautions   Precautions None      Restrictions   Weight Bearing Restrictions No      Balance Screen   Has the patient fallen in the past 6 months No    Has the patient had a decrease in activity level because of a fear of falling?  No    Is the patient reluctant to leave their home because of a fear of falling?  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 Strength is [facility] although mild weakness in shoulder ER;     Focus on Therapeutic Outcomes (FOTO)  71%      Sensation   Light Touch Appears Intact    Proprioception Appears Intact      Coordination   Gross Motor Movements are Fluid and Coordinated Yes    Fine Motor Movements are Fluid and Coordinated Yes    Finger Nose Finger Test accurate bilaterally;      Posture/Postural Control   Posture Comments WFL      ROM / Strength   AROM / PROM / Strength AROM;Strength      AROM   Overall AROM Comments BUE and BLE are [facility]      Strength   Overall Strength Comments BUE are [facility]    Strength Assessment Site Hip;Knee;Ankle    Right/Left Hip Right;Left    Right Hip Flexion 4/5    Right Hip Extension 3+/5    Right Hip ABduction 4-/5    Right Hip ADduction 3+/5    Left Hip Flexion 5/5    Left Hip Extension 4-/5    Left Hip ABduction 4-/5    Left Hip ADduction 4-/5    Right/Left Knee Right;Left    Right Knee Flexion 4-/5    Right Knee Extension 5/5    Left Knee Flexion 5/5    Left Knee Extension 5/5    Right/Left Ankle Right;Left    Right Ankle Dorsiflexion 5/5    Left Ankle Dorsiflexion 5/5      Transfers   Comments able  to transfer independently, slightly more  effort and time required      Ambulation/Gait   Gait Comments when ambulating patient exhibits antalgic gait with reciprocal gait pattern, wide base of support increased lateral lean bilaterally; She also exhibits varus deformity of BLE knee, slightly forward flexed posture, slower gait speed      6 Minute Walk- Baseline   6 Minute Walk- Baseline yes    BP (mmHg) 133/87    HR (bpm) 67    02 Sat (%RA) 99 %      6 Minute walk- Post Test   6 Minute Walk Post Test yes    BP (mmHg) 140/88    HR (bpm) 88    02 Sat (%RA) 100 %    Perceived Rate of Exertion ([place]) 13- Somewhat hard      6 minute walk test results    Aerobic Endurance Distance Walked 1085    Endurance additional comments without AD; less than age group norms of   feet;      Standardized Balance Assessment   Standardized Balance Assessment Five Times Sit to Stand;10 meter walk test    Five times sit to stand comments  21.17 sec with hands pushing on thigh, >15 sec indicates high fall risk    10 Meter Walk 0.95 m/s without AD      High Level Balance   High Level Balance Comments static standing balance is good; dynamic standing balance is good;                      Objective measurements completed on examination: See above findings.               PT Education - 04/03/20 0903    Education Details plan of care/recommendations    Person(s) Educated Patient    Methods Explanation    Comprehension Verbalized understanding            PT Short Term Goals - 04/03/20 0911      PT SHORT TERM GOAL #1   Title Patient will be adherent to HEP at least 3x a week to improve functional strength and balance for better safety at home.    Time 2    Period Weeks    Status New    Target Date 04/17/20             PT Long Term Goals - 04/03/20 0912      PT LONG TERM GOAL #1   Title Patient will increase 10 meter walk test to >1.42m/s as to improve gait speed for better community ambulation and to  reduce fall risk.    Time 4    Period Weeks    Status New    Target Date 05/01/20      PT LONG TERM GOAL #2   Title Patient will improve FOTO score to >75% to indicate improved functional mobility with ADLs.    Time 4    Period Weeks    Status New    Target Date 05/01/20      PT LONG TERM GOAL #3   Title Patient will increase RLE gross strength to 4+/5 as to improve functional strength for independent gait, increased standing tolerance and increased ADL ability.    Time 4    Period Weeks    Status New    Target Date 05/01/20      PT LONG TERM GOAL #4   Title Patient will be able to lift 20#  from floor to waist to shoulder height with no difficulty in order to return to work tasks;    Time 4    Period Weeks    Status New    Target Date 05/01/20                  Plan - 04/03/20 0904    Clinical Impression Statement 59 yo Female s/p CVA on 02/06/20 presents to therapy complaining of right sided weakness. Patient does exhibit mild weakness in RUE particularly shoulder external rotation and in RLE. She has a PMH for OA in bilateral knee, s/p TKA in left knee in Oct 2020 however is hoping to avoid surgery in right knee. When standing and ambulating she does exhibit a varus knee deformity and ambulates with wide base of support, antalgic gait with increased lateral lean side/side. Patient does ambulate with slightly slower gait speed. She was working at [employer] and is supposed to return to work in 2 weeks. She will need to be able to lift 20# and standing/walk for 8 hours a day. She would benefit from skilled PT intervention to improve strength and mobility and return to work.    Personal Factors and Comorbidities Fitness;Past/Current Experience;Comorbidity 3+    Comorbidities s/p thyroid cancer s/p thyroidectomy in 2017, s/p LLE TKA in 2020, RLE OA; former smoker;    Examination-Activity Limitations Bathing;Carry;Lift;Locomotion Level;Squat;Stairs;Stand;Transfers     Examination-Participation Restrictions Cleaning;Community Activity;Shop;Yard Work    Stability/Clinical Decision Making Stable/Uncomplicated    Clinical Decision Making Low    Rehab Potential Good    PT Frequency 2x / week    PT Duration 4 weeks    PT Treatment/Interventions ADLs/Self Care Home Management;Cryotherapy;Electrical Stimulation;Moist Heat;Gait training;Stair training;Functional mobility training;Therapeutic activities;Therapeutic exercise;Balance training;Neuromuscular re-education;Patient/family education;Manual techniques;Passive range of motion;Energy conservation    PT Next Visit Plan initiate HEP    PT Home Exercise Plan will address next session;    Recommended Other Services evaluation scheduled with ST    Consulted and Agree with Plan of Care Patient           Patient will benefit from skilled therapeutic intervention in order to improve the following deficits and impairments:  Abnormal gait, Decreased endurance, Decreased mobility, Difficulty walking, Decreased activity tolerance, Decreased strength, Pain  Visit Diagnosis: Muscle weakness (generalized)  Difficulty in walking, not elsewhere classified     Problem List Patient Active Problem List   Diagnosis Date Noted  . Urinary incontinence in female 02/12/2020  . Primary osteoarthritis of right knee 02/12/2020  . Dysarthria due to recent stroke 02/12/2020  . Acute CVA (cerebrovascular accident) ([place]) 02/06/2020  . Screen for colon cancer 04/01/2017  . S/P total thyroidectomy 10/14/2015  . Papillary thyroid carcinoma ([place]) 10/01/2015  . Osteoarthritis of left knee 09/02/2015  . Hidradenitis axillaris 09/02/2015    Dontee Jaso PT, DPT 04/03/2020, 9:14 AM  [place] MAIN [facility] SERVICES [address] [place], [place], [address] Phone: [phone]   Fax:  [phone]  Name: Leah Rogers MRN: [id number] Date of Birth: 1961-07-08

## 2020-04-04 NOTE — Therapy (Signed)
[place] MAIN [facility] SERVICES [address] [place], [place], [address] Phone: [phone]   Fax:  [phone]  Speech Language Pathology Evaluation  Patient Details  Name: Leah Rogers MRN: [id number] Date of Birth: 1961/02/25 Referring Provider (SLP): Halina Maidens (PCP)   Encounter Date: 04/03/2020   End of Session - 04/03/20 1616    Visit Number 1    Number of Visits 25    Date for SLP Re-Evaluation 06/26/20    Authorization Type [employer]    Authorization Time Period 04/03/2020 thru 06/26/2020    Authorization - Visit Number 1    Progress Note Due on Visit 10    SLP Start Time 0900    SLP Stop Time  1000    SLP Time Calculation (min) 60 min           Past Medical History:  Diagnosis Date  . Arthritis   . Cancer ([place])    Thyroid  . Heart murmur   . History of CVA (cerebrovascular accident) 02/06/2020   02/06/20  . Infected sebaceous cyst 10/26/2018  . Thyroid disease     Past Surgical History:  Procedure Laterality Date  . COLONOSCOPY WITH PROPOFOL N/A 03/20/2016   Procedure: COLONOSCOPY WITH PROPOFOL;  Surgeon: Lucilla Lame, MD;  Location: [place];  Service: Endoscopy;  Laterality: N/A;  . ENDOMETRIAL BIOPSY  2013   benign- post menopausal bleeding  . THYROIDECTOMY N/A 10/14/2015   Procedure: THYROIDECTOMY;  Surgeon: Margaretha Sheffield, MD;  Location: [facility];  Service: ENT;  Laterality: N/A;  . TUBAL LIGATION      There were no vitals filed for this visit.   Subjective Assessment - 04/03/20 1610    Subjective pt pleasant, soft spoken, quiet    Patient is accompained by: Family member   pt's cousin brought her but didn't attend session   Currently in Pain? No/denies              SLP Evaluation [facility] - 04/03/20 1610      SLP Visit Information   SLP Received On 04/03/20    Referring Provider (SLP) Halina Maidens (PCP)    Onset Date 02/06/2020    Medical Diagnosis CVA      Subjective    Subjective pt A&O x4, good historian    Patient/Family Stated Goal for her speech to continue getting better      General Information   HPI 59 yo Female s/p CVA on 02/06/20 with acute onset of RUE weakness and facial droop. Head CT and MRI revealed acute small vessel infarct involving the left basal ganglia and adjacent white matter, unchanged appearance of an indeterminate subcentimeter low-density focus within the paramedian right pons which could reflect an old or recent pontine infarction, and redemonstrated chronic infarct within the right basal ganglia and radiating white matter tracks. Pt presents today as a referral made on 02/07/2020 from Halina Maidens (PCP) for slurred speech.      Behavioral/Cognition appropriate, appears shy    Mobility Status ambulatory      Balance Screen   Has the patient fallen in the past 6 months No    Has the patient had a decrease in activity level because of a fear of falling?  No    Is the patient reluctant to leave their home because of a fear of falling?  No      Prior Functional Status   Cognitive/Linguistic Baseline Within functional limits    Type of Home Mobile home  Lives With Spouse    Available Support Family;Friend(s)    Vocation Full time employment      Cognition   Overall Cognitive Status Within Functional Limits for tasks assessed      Auditory Comprehension   Overall Auditory Comprehension Appears within functional limits for tasks assessed      Visual Recognition/Discrimination   Discrimination Within Function Limits      Reading Comprehension   Reading Status Within funtional limits      Expression   Primary Mode of Expression Verbal      Verbal Expression   Overall Verbal Expression Appears within functional limits for tasks assessed      Written Expression   Dominant Hand Right    Written Expression Not tested      Oral Motor/Sensory Function   Overall Oral Motor/Sensory Function Appears within functional limits  for tasks assessed      Motor Speech   Overall Motor Speech Appears within functional limits for tasks assessed                           SLP Education - 04/03/20 1616    Education Details provided on speech intelligibility, voice    Person(s) Educated Patient    Methods Explanation;Demonstration;Verbal cues;Handout    Comprehension Verbalized understanding              SLP Long Term Goals - 04/04/20 0757      SLP LONG TERM GOAL #1   Title Pt will use speech intelligibility strategies to communicate intelligibility across all environments with daily living.    Baseline good speech intelligibility within treatment room, informaiton being gathered within other environments    Time 12    Period Weeks    Status New    Target Date 06/26/20            Plan - 04/03/20 1620    Clinical Impression Statement Pt reports that her speech, while greatly improved, continues to sound slurred over baseline. During this evaluation pt demonstrated functional oral motor abilities with no weakness or discoordination. Subjectively her speech is reduced in vocal intensity, but she states that she is a very quiet spoken person. Specifically, she reports that her son and husband have greater difficulty understanding her d/t her fast rate of speech during moment of irritation with family. During this evaluation, pt also reports that she is carefully monitoring her rate of speech as she is with a speech therapist. Recommend one additional session to further differentiate any areas of potential deficits.    Speech Therapy Frequency 2x / week    Duration --   12 weeks   Treatment/Interventions SLP instruction and feedback    Potential to Achieve Goals Good    Consulted and Agree with Plan of Care Patient           Patient will benefit from skilled therapeutic intervention in order to improve the following deficits and impairments:   Dysarthria and anarthria - Plan: SLP plan of care  cert/re-cert    Problem List Patient Active Problem List   Diagnosis Date Noted  . Urinary incontinence in female 02/12/2020  . Primary osteoarthritis of right knee 02/12/2020  . Dysarthria due to recent stroke 02/12/2020  . Acute CVA (cerebrovascular accident) ([place]) 02/06/2020  . Screen for colon cancer 04/01/2017  . S/P total thyroidectomy 10/14/2015  . Papillary thyroid carcinoma ([place]) 10/01/2015  . Osteoarthritis of left knee 09/02/2015  . Hidradenitis  axillaris 09/02/2015   Sariyah Corcino B. Rutherford Nail M.S., CCC-SLP, [place] Office [phone]  Stormy Fabian 04/04/2020, 8:00 AM  [place] MAIN [facility] SERVICES [address] [place], [place], [address] Phone: [phone]   Fax:  [phone]  Name: Leah Rogers MRN: [id number] Date of Birth: December 29, 1960

## 2020-04-08 ENCOUNTER — Ambulatory Visit: Payer: BC Managed Care – PPO

## 2020-04-08 ENCOUNTER — Ambulatory Visit: Payer: BC Managed Care – PPO | Admitting: Speech Pathology

## 2020-04-08 ENCOUNTER — Encounter: Payer: Self-pay | Admitting: Physical Therapy

## 2020-04-08 ENCOUNTER — Other Ambulatory Visit: Payer: Self-pay

## 2020-04-08 DIAGNOSIS — R262 Difficulty in walking, not elsewhere classified: Secondary | ICD-10-CM

## 2020-04-08 DIAGNOSIS — R471 Dysarthria and anarthria: Secondary | ICD-10-CM | POA: Diagnosis not present

## 2020-04-08 DIAGNOSIS — M6281 Muscle weakness (generalized): Secondary | ICD-10-CM | POA: Diagnosis not present

## 2020-04-08 NOTE — Therapy (Signed)
[place] MAIN [facility] SERVICES [address] [place], [place], [address] Phone: [phone]   Fax:  [phone]  Physical Therapy Treatment  Patient Details  Name: Leah Rogers MRN: [id number] Date of Birth: 18-Apr-1961 Referring Provider (PT): Halina Maidens MD    Encounter Date: 04/08/2020   PT End of Session - 04/08/20 1051    Visit Number 2    Number of Visits 9    Date for PT Re-Evaluation 05/01/20    Authorization Type BCBS plan    Authorization Time Period FOTO completed- PT    PT Start Time 1100    PT Stop Time 1142    PT Time Calculation (min) 42 min    Activity Tolerance Patient tolerated treatment well    Behavior During Therapy Flat affect           Past Medical History:  Diagnosis Date  . Arthritis   . Cancer ([place])    Thyroid  . Heart murmur   . History of CVA (cerebrovascular accident) 02/06/2020   02/06/20  . Infected sebaceous cyst 10/26/2018  . Thyroid disease     Past Surgical History:  Procedure Laterality Date  . COLONOSCOPY WITH PROPOFOL N/A 03/20/2016   Procedure: COLONOSCOPY WITH PROPOFOL;  Surgeon: Lucilla Lame, MD;  Location: [place];  Service: Endoscopy;  Laterality: N/A;  . ENDOMETRIAL BIOPSY  2013   benign- post menopausal bleeding  . THYROIDECTOMY N/A 10/14/2015   Procedure: THYROIDECTOMY;  Surgeon: Margaretha Sheffield, MD;  Location: [facility];  Service: ENT;  Laterality: N/A;  . TUBAL LIGATION      There were no vitals filed for this visit.   Subjective Assessment - 04/08/20 1100    Subjective Patient reported that she is having mild R knee pain.    Pertinent History 59 yo Female s/p CVA on 02/06/20 with acute onset of RUE weakness and facial droop. She was seen for PT in the hospital and then discharged home. She presents to therapy without any AD and states that she does not use them. She denies any numbness/tingling; She denies any recent falls; She does ambulate with an antalgic gait and is  s/p L TKA in October 2020 with good results. She does have OA in right knee but she is hoping to avoid surgery on the right side. PMH significant for thyroid cancer s/p thyroidectomy in 2017, levels controlled currently; also has heart murmur;    How long can you sit comfortably? pain in right knee after sitting a few hours;    How long can you stand comfortably? 1-2 hours    How long can you walk comfortably? no difficulty; unable to walk a mile due to right knee;    Diagnostic tests MRI on 02/06/20: Acute small vessel infarct involving the left basal ganglia and adjacent white matter;    Patient Stated Goals get stronger on the right side;    Currently in Pain? Yes    Pain Score 2     Pain Location Knee    Pain Orientation Right;Anterior;Lateral    Pain Descriptors / Indicators Tender    Pain Type Chronic pain           Therapeutic exercise: Access Code: [email or web address]   Exercises Sit to Stand without Arm Support 2 sets - 10 reps Supine Bridge -  10 reps Sidelying clamshells 2 sets of 10 reps Seated Long Arc Quad -2 sets of 10 rep, with 3 second holds on RLE Standing Hip Extension  with Counter Support 3 sets - 10 reps Standing Hip Abduction with Counter Support 3 sets - 10 reps Standing Shoulder Flexion with Resistance 3 sets - 10 reps Standing Elbow Flexion with Resistance 3 sets - 10 reps Standing Row with Anchored Resistance 3 sets - 10 reps  Pt response/clinical impression: Pt experienced R hamstring cramp after knee flexion in sitting, PT assist to stretch and a few bouts of walking, cramp resolved. Pt challenged by strengthening exercises today, reported feeling a workout at end of session. HEP updated and administered, no questions at this time.       PT Education - 04/08/20 1049    Education Details HEP, therex    Person(s) Educated Patient    Methods Explanation    Comprehension Verbalized understanding;Verbal cues required;Returned demonstration            PT  Short Term Goals - 04/03/20 0911      PT SHORT TERM GOAL #1   Title Patient will be adherent to HEP at least 3x a week to improve functional strength and balance for better safety at home.    Time 2    Period Weeks    Status New    Target Date 04/17/20             PT Long Term Goals - 04/03/20 0912      PT LONG TERM GOAL #1   Title Patient will increase 10 meter walk test to >1.67m/s as to improve gait speed for better community ambulation and to reduce fall risk.    Time 4    Period Weeks    Status New    Target Date 05/01/20      PT LONG TERM GOAL #2   Title Patient will improve FOTO score to >75% to indicate improved functional mobility with ADLs.    Time 4    Period Weeks    Status New    Target Date 05/01/20      PT LONG TERM GOAL #3   Title Patient will increase RLE gross strength to 4+/5 as to improve functional strength for independent gait, increased standing tolerance and increased ADL ability.    Time 4    Period Weeks    Status New    Target Date 05/01/20      PT LONG TERM GOAL #4   Title Patient will be able to lift 20# from floor to waist to shoulder height with no difficulty in order to return to work tasks;    Time 4    Period Weeks    Status New    Target Date 05/01/20                 Plan - 04/08/20 1050    Clinical Impression Statement Pt experienced R hamstring cramp after knee flexion in sitting, PT assist to stretch and a few bouts of walking, cramp resolved. Pt challenged by strengthening exercises today, reported feeling a workout at end of session. HEP updated and administered, no questions at this time.    Personal Factors and Comorbidities Fitness;Past/Current Experience;Comorbidity 3+    Comorbidities s/p thyroid cancer s/p thyroidectomy in 2017, s/p LLE TKA in 2020, RLE OA; former smoker;    Examination-Activity Limitations Bathing;Carry;Lift;Locomotion Level;Squat;Stairs;Stand;Transfers    Examination-Participation Restrictions  Cleaning;Community Activity;Shop;Yard Work    Stability/Clinical Decision Making Stable/Uncomplicated    Rehab Potential Good    PT Frequency 2x / week    PT Duration 4 weeks    PT Treatment/Interventions  ADLs/Self Care Home Management;Cryotherapy;Electrical Stimulation;Moist Heat;Gait training;Stair training;Functional mobility training;Therapeutic activities;Therapeutic exercise;Balance training;Neuromuscular re-education;Patient/family education;Manual techniques;Passive range of motion;Energy conservation    PT Next Visit Plan strengthening, address potential knee pain    PT Home Exercise Plan HEP code: [email or web address]    Consulted and Agree with Plan of Care Patient           Patient will benefit from skilled therapeutic intervention in order to improve the following deficits and impairments:  Abnormal gait, Decreased endurance, Decreased mobility, Difficulty walking, Decreased activity tolerance, Decreased strength, Pain  Visit Diagnosis: Dysarthria and anarthria  Difficulty in walking, not elsewhere classified  Muscle weakness (generalized)     Problem List Patient Active Problem List   Diagnosis Date Noted  . Urinary incontinence in female 02/12/2020  . Primary osteoarthritis of right knee 02/12/2020  . Dysarthria due to recent stroke 02/12/2020  . Acute CVA (cerebrovascular accident) ([place]) 02/06/2020  . Screen for colon cancer 04/01/2017  . S/P total thyroidectomy 10/14/2015  . Papillary thyroid carcinoma ([place]) 10/01/2015  . Osteoarthritis of left knee 09/02/2015  . Hidradenitis axillaris 09/02/2015    Lieutenant Diego PT, DPT 11:44 AM,04/08/20   [place] MAIN [facility] SERVICES [address] [place], [place], [address] Phone: [phone]   Fax:  [phone]  Name: Leah Rogers MRN: [id number] Date of Birth: 1961/01/12

## 2020-04-08 NOTE — Therapy (Signed)
[place] MAIN [facility] SERVICES [address] [place], [place], [address] Phone: [phone]   Fax:  [phone]  Speech Language Pathology Treatment DISCHARGE SUMMARY  Patient Details  Name: Leah Rogers MRN: [id number] Date of Birth: 14-Mar-1961 Referring Provider (SLP): Halina Maidens (PCP)   Encounter Date: 04/08/2020   End of Session - 04/08/20 1258    Visit Number 2    SLP Start Time 1000    SLP Stop Time  4174    SLP Time Calculation (min) 35 min    Activity Tolerance Patient tolerated treatment well           Past Medical History:  Diagnosis Date  . Arthritis   . Cancer ([place])    Thyroid  . Heart murmur   . History of CVA (cerebrovascular accident) 02/06/2020   02/06/20  . Infected sebaceous cyst 10/26/2018  . Thyroid disease     Past Surgical History:  Procedure Laterality Date  . COLONOSCOPY WITH PROPOFOL N/A 03/20/2016   Procedure: COLONOSCOPY WITH PROPOFOL;  Surgeon: Lucilla Lame, MD;  Location: [place];  Service: Endoscopy;  Laterality: N/A;  . ENDOMETRIAL BIOPSY  2013   benign- post menopausal bleeding  . THYROIDECTOMY N/A 10/14/2015   Procedure: THYROIDECTOMY;  Surgeon: Margaretha Sheffield, MD;  Location: [facility];  Service: ENT;  Laterality: N/A;  . TUBAL LIGATION      There were no vitals filed for this visit.   Subjective Assessment - 04/08/20 1256    Subjective pt pleasant, soft spoken, quiet    Patient is accompained by: Family member   pt's cousin   Currently in Pain? No/denies                 ADULT SLP TREATMENT - 04/08/20 1256      General Information   Behavior/Cognition Alert;Cooperative;Pleasant mood    HPI 59 yo Female s/p CVA on 02/06/20 with acute onset of RUE weakness and facial droop. Head CT and MRI revealed acute small vessel infarct involving the left basal ganglia and adjacent white matter, unchanged appearance of an indeterminate subcentimeter low-density focus within the  paramedian right pons which could reflect an old or recent pontine infarction, and redemonstrated chronic infarct within the right basal ganglia and radiating white matter tracks. Pt presents today as a referral made on 02/07/2020 from Halina Maidens (PCP) for slurred speech.        Treatment Provided   Treatment provided Cognitive-Linquistic      Pain Assessment   Pain Assessment No/denies pain      Cognitive-Linquistic Treatment   Treatment focused on Dysarthria    Skilled Treatment Pt's complex conversational speech was fully intelligible with no evidence of dysarthria. Pt also reports increased intelligibility with only 1 instance of rapid speech for which she corrected within her home environment.      Assessment / Recommendations / Plan   Plan All goals met      Progression Toward Goals   Progression toward goals Goals met, education completed, patient discharged from SLP            SLP Education - 04/08/20 1258    Education Details speech intelligibility strategies    Person(s) Educated Patient    Methods Explanation    Comprehension Verbalized understanding;Returned demonstration              SLP Long Term Goals - 04/08/20 1302      SLP LONG TERM GOAL #1   Title Pt will  use speech intelligibility strategies to communicate intelligibility across all environments with daily living.    Status Achieved            Plan - 04/08/20 1259    Clinical Impression Statement Pt continues to demonstrate improved speech intelligibility as well as awareness of communication breakdowns and how to fix them. Pt stated that she had 1 instance of unintelligible speech d/t fast rate of speech. However, she recognized this, self-corrected with improved speech intelligibility. During this session, pt's complex conversational speech was free of any s/s o dysarthria. Pt feels that her speech is "fine." Given pt's progress towards goals, skilled ST intervention is no longer required.     Treatment/Interventions SLP instruction and feedback    Potential to Achieve Goals Good    Consulted and Agree with Plan of Care Patient           Patient will benefit from skilled therapeutic intervention in order to improve the following deficits and impairments:   Dysarthria and anarthria    Problem List Patient Active Problem List   Diagnosis Date Noted  . Urinary incontinence in female 02/12/2020  . Primary osteoarthritis of right knee 02/12/2020  . Dysarthria due to recent stroke 02/12/2020  . Acute CVA (cerebrovascular accident) ([place]) 02/06/2020  . Screen for colon cancer 04/01/2017  . S/P total thyroidectomy 10/14/2015  . Papillary thyroid carcinoma ([place]) 10/01/2015  . Osteoarthritis of left knee 09/02/2015  . Hidradenitis axillaris 09/02/2015   Leah Molnar B. Rutherford Nail M.S., CCC-SLP, [place] Pathologist Rehabilitation Services Office [phone]   Stormy Fabian 04/08/2020, 1:13 PM  [place] MAIN [facility] SERVICES [address] [place], [place], [address] Phone: [phone]   Fax:  [phone]   Name: Leah Rogers MRN: [id number] Date of Birth: Jul 24, 1961

## 2020-04-10 ENCOUNTER — Encounter: Payer: Self-pay | Admitting: Physical Therapy

## 2020-04-10 ENCOUNTER — Other Ambulatory Visit: Payer: Self-pay

## 2020-04-10 ENCOUNTER — Encounter: Payer: BC Managed Care – PPO | Admitting: Speech Pathology

## 2020-04-10 ENCOUNTER — Ambulatory Visit: Payer: BC Managed Care – PPO

## 2020-04-10 DIAGNOSIS — M6281 Muscle weakness (generalized): Secondary | ICD-10-CM

## 2020-04-10 DIAGNOSIS — R262 Difficulty in walking, not elsewhere classified: Secondary | ICD-10-CM | POA: Diagnosis not present

## 2020-04-10 DIAGNOSIS — R471 Dysarthria and anarthria: Secondary | ICD-10-CM | POA: Diagnosis not present

## 2020-04-10 NOTE — Therapy (Signed)
[place] MAIN [facility] SERVICES [address] [place], [place], [address] Phone: [phone]   Fax:  [phone]  Physical Therapy Treatment  Patient Details  Name: Leah Rogers MRN: [id number] Date of Birth: [birth year] Referring Provider (PT): Halina Maidens MD    Encounter Date: 04/10/2020   PT End of Session - 04/10/20 1049    Visit Number 3    Number of Visits 9    Date for PT Re-Evaluation 05/01/20    Authorization Type BCBS plan    Authorization Time Period FOTO completed- PT    PT Start Time 1053    PT Stop Time 1137    PT Time Calculation (min) 44 min    Activity Tolerance Patient tolerated treatment well    Behavior During Therapy Flat affect           Past Medical History:  Diagnosis Date  . Arthritis   . Cancer ([place])    Thyroid  . Heart murmur   . History of CVA (cerebrovascular accident) 02/06/2020   02/06/20  . Infected sebaceous cyst 10/26/2018  . Thyroid disease     Past Surgical History:  Procedure Laterality Date  . COLONOSCOPY WITH PROPOFOL N/A 03/20/2016   Procedure: COLONOSCOPY WITH PROPOFOL;  Surgeon: Lucilla Lame, MD;  Location: [place];  Service: Endoscopy;  Laterality: N/A;  . ENDOMETRIAL BIOPSY  2013   benign- post menopausal bleeding  . THYROIDECTOMY N/A 10/14/2015   Procedure: THYROIDECTOMY;  Surgeon: Margaretha Sheffield, MD;  Location: [facility];  Service: ENT;  Laterality: N/A;  . TUBAL LIGATION      There were no vitals filed for this visit.   Subjective Assessment - 04/10/20 1055    Subjective Patient stated she only has a little pain today.    Pertinent History 59 yo Female s/p CVA on 02/06/20 with acute onset of RUE weakness and facial droop. She was seen for PT in the hospital and then discharged home. She presents to therapy without any AD and states that she does not use them. She denies any numbness/tingling; She denies any recent falls; She does ambulate with an antalgic gait and is s/p L  TKA in October 2020 with good results. She does have OA in right knee but she is hoping to avoid surgery on the right side. PMH significant for thyroid cancer s/p thyroidectomy in 2017, levels controlled currently; also has heart murmur;    How long can you sit comfortably? pain in right knee after sitting a few hours;    How long can you stand comfortably? 1-2 hours    How long can you walk comfortably? no difficulty; unable to walk a mile due to right knee;    Diagnostic tests MRI on 02/06/20: Acute small vessel infarct involving the left basal ganglia and adjacent white matter;    Patient Stated Goals get stronger on the right side;    Currently in Pain? Yes    Pain Score 1     Pain Location Knee    Pain Orientation Right;Anterior;Lateral    Pain Descriptors / Indicators Tender    Pain Type Chronic pain           Therapeutic exercise: Access Code: [email or web address]     Exercises Nustep x5 mins level 2 for warmup, continuous monitoring for maintaining SPM >40  Sit to Stand without Arm Support 2 sets - 10 reps Supine Bridge -  10 reps Sidelying clamshells 2 sets of 10 reps Seated Long Arc  Quad -2 sets of 10 rep, with 3 second holds on RLE Standing Hip Extension with Counter Support 3 sets - 10 reps Standing Hip Abduction with Counter Support 3 sets - 10 reps Standing Shoulder Flexion with Resistance 3 sets - 10 reps Standing Elbow Flexion with Resistance 3 sets - 10 reps Standing Row with Anchored Resistance 3 sets - 10 reps minisquat with UE support x15 cues for form throughout Standing static lunges with R knee back x10, unable to perform with R leg forward Step taps to 6 in step x2 mins, focus on endurance. Pt able to complete without rest break, RUE support. Seated rest break needed after Standing hamstring curls 3x10  SLR in supine x10 bilaterally  minisquats and hamstring curls added verbally, will need form checked next session and official print out.  Pt response/clinical  impression: Pt challenged by static lunges, unable to complete with RLE in front of LLE due to increase in pain. Pt also needed tactile cueing throughout mini squats to address form and technique. Pt remains very motivated to progress towards goals to return to work.        PT Education - 04/10/20 1048    Education Details therex, HEP    Person(s) Educated Patient    Methods Explanation;Demonstration    Comprehension Verbalized understanding;Returned demonstration;Verbal cues required;Need further instruction            PT Short Term Goals - 04/03/20 0911      PT SHORT TERM GOAL #1   Title Patient will be adherent to HEP at least 3x a week to improve functional strength and balance for better safety at home.    Time 2    Period Weeks    Status New    Target Date 04/17/20             PT Long Term Goals - 04/03/20 0912      PT LONG TERM GOAL #1   Title Patient will increase 10 meter walk test to >1.24m/s as to improve gait speed for better community ambulation and to reduce fall risk.    Time 4    Period Weeks    Status New    Target Date 05/01/20      PT LONG TERM GOAL #2   Title Patient will improve FOTO score to >75% to indicate improved functional mobility with ADLs.    Time 4    Period Weeks    Status New    Target Date 05/01/20      PT LONG TERM GOAL #3   Title Patient will increase RLE gross strength to 4+/5 as to improve functional strength for independent gait, increased standing tolerance and increased ADL ability.    Time 4    Period Weeks    Status New    Target Date 05/01/20      PT LONG TERM GOAL #4   Title Patient will be able to lift 20# from floor to waist to shoulder height with no difficulty in order to return to work tasks;    Time 4    Period Weeks    Status New    Target Date 05/01/20                 Plan - 04/10/20 1049    Clinical Impression Statement Pt challenged by static lunges, unable to complete with RLE in front of  LLE due to increase in pain. Pt also needed tactile cueing throughout mini squats to address form  and technique. Pt remains very motivated to progress towards goals to return to work.    Personal Factors and Comorbidities Fitness;Past/Current Experience;Comorbidity 3+    Comorbidities s/p thyroid cancer s/p thyroidectomy in 2017, s/p LLE TKA in 2020, RLE OA; former smoker;    Examination-Activity Limitations Bathing;Carry;Lift;Locomotion Level;Squat;Stairs;Stand;Transfers    Examination-Participation Restrictions Cleaning;Community Activity;Shop;Yard Work    Stability/Clinical Decision Making Stable/Uncomplicated    Rehab Potential Good    PT Frequency 2x / week    PT Duration 4 weeks    PT Treatment/Interventions ADLs/Self Care Home Management;Cryotherapy;Electrical Stimulation;Moist Heat;Gait training;Stair training;Functional mobility training;Therapeutic activities;Therapeutic exercise;Balance training;Neuromuscular re-education;Patient/family education;Manual techniques;Passive range of motion;Energy conservation    PT Next Visit Plan strengthening, address potential knee pain    PT Home Exercise Plan HEP code: [email or web address]    Consulted and Agree with Plan of Care Patient           Patient will benefit from skilled therapeutic intervention in order to improve the following deficits and impairments:  Abnormal gait, Decreased endurance, Decreased mobility, Difficulty walking, Decreased activity tolerance, Decreased strength, Pain  Visit Diagnosis: Dysarthria and anarthria  Difficulty in walking, not elsewhere classified  Muscle weakness (generalized)     Problem List Patient Active Problem List   Diagnosis Date Noted  . Urinary incontinence in female 02/12/2020  . Primary osteoarthritis of right knee 02/12/2020  . Dysarthria due to recent stroke 02/12/2020  . Acute CVA (cerebrovascular accident) ([place]) 02/06/2020  . Screen for colon cancer 04/01/2017  . S/P total thyroidectomy  10/14/2015  . Papillary thyroid carcinoma ([place]) 10/01/2015  . Osteoarthritis of left knee 09/02/2015  . Hidradenitis axillaris 09/02/2015    Lieutenant Diego PT, DPT 11:39 AM,04/10/20   [place] MAIN [facility] SERVICES [address] [place], [place], [address] Phone: [phone]   Fax:  [phone]  Name: Leah Rogers MRN: [id number] Date of Birth: May 18, 1961

## 2020-04-15 ENCOUNTER — Encounter: Payer: BC Managed Care – PPO | Admitting: Speech Pathology

## 2020-04-17 ENCOUNTER — Encounter: Payer: Self-pay | Admitting: Physical Therapy

## 2020-04-17 ENCOUNTER — Ambulatory Visit: Payer: BC Managed Care – PPO | Admitting: Physical Therapy

## 2020-04-17 ENCOUNTER — Other Ambulatory Visit: Payer: Self-pay

## 2020-04-17 DIAGNOSIS — M6281 Muscle weakness (generalized): Secondary | ICD-10-CM

## 2020-04-17 DIAGNOSIS — R262 Difficulty in walking, not elsewhere classified: Secondary | ICD-10-CM

## 2020-04-17 DIAGNOSIS — R471 Dysarthria and anarthria: Secondary | ICD-10-CM | POA: Diagnosis not present

## 2020-04-17 NOTE — Therapy (Signed)
[place] MAIN [facility] SERVICES [address] [place], [place], [address] Phone: [phone]   Fax:  [phone]  Physical Therapy Treatment  Patient Details  Name: Leah Rogers MRN: [id number] Date of Birth: August 10, 1961 Referring Provider (PT): Halina Maidens MD    Encounter Date: 04/17/2020   PT End of Session - 04/17/20 0835    Visit Number 4    Number of Visits 9    Date for PT Re-Evaluation 05/01/20    Authorization Type BCBS plan    Authorization Time Period FOTO completed- PT    PT Start Time 0835    PT Stop Time 0920    PT Time Calculation (min) 45 min    Equipment Utilized During Treatment Gait belt    Activity Tolerance Patient tolerated treatment well    Behavior During Therapy Flat affect           Past Medical History:  Diagnosis Date  . Arthritis   . Cancer ([place])    Thyroid  . Heart murmur   . History of CVA (cerebrovascular accident) 02/06/2020   02/06/20  . Infected sebaceous cyst 10/26/2018  . Thyroid disease     Past Surgical History:  Procedure Laterality Date  . COLONOSCOPY WITH PROPOFOL N/A 03/20/2016   Procedure: COLONOSCOPY WITH PROPOFOL;  Surgeon: Lucilla Lame, MD;  Location: [place];  Service: Endoscopy;  Laterality: N/A;  . ENDOMETRIAL BIOPSY  2013   benign- post menopausal bleeding  . THYROIDECTOMY N/A 10/14/2015   Procedure: THYROIDECTOMY;  Surgeon: Margaretha Sheffield, MD;  Location: [facility];  Service: ENT;  Laterality: N/A;  . TUBAL LIGATION      There were no vitals filed for this visit.   Subjective Assessment - 04/17/20 0839    Subjective Patient reports doing well. She is having a little right knee pain. She reports adherence with HEP, doing them every other day; Patient reports she is scheduled to return to work next week.    Pertinent History 59 yo Female s/p CVA on 02/06/20 with acute onset of RUE weakness and facial droop. She was seen for PT in the hospital and then discharged home.  She presents to therapy without any AD and states that she does not use them. She denies any numbness/tingling; She denies any recent falls; She does ambulate with an antalgic gait and is s/p L TKA in October 2020 with good results. She does have OA in right knee but she is hoping to avoid surgery on the right side. PMH significant for thyroid cancer s/p thyroidectomy in 2017, levels controlled currently; also has heart murmur;    How long can you sit comfortably? pain in right knee after sitting a few hours;    How long can you stand comfortably? 1-2 hours    How long can you walk comfortably? no difficulty; unable to walk a mile due to right knee;    Diagnostic tests MRI on 02/06/20: Acute small vessel infarct involving the left basal ganglia and adjacent white matter;    Patient Stated Goals get stronger on the right side;    Currently in Pain? Yes    Pain Score 2     Pain Location Knee    Pain Orientation Right    Pain Descriptors / Indicators Aching;Sore    Pain Type Chronic pain    Pain Onset More than a month ago    Pain Frequency Intermittent    Aggravating Factors  transfers/weight bearing    Pain  Relieving Factors rest    Effect of Pain on Daily Activities decreased activity tolerance;    Multiple Pain Sites No                 Therapeutic exercise:  Exercises Nustep x4 mins BUE/BLE level 2 for warmup, continuous monitoring for maintaining SPM >50  Standing with green tband around BLE: -hip flexion SLR x15 reps; -hip abduction SLR x15 reps; -hip extension SLR x15 reps; -side stepping x10 feet x3 laps each direction; Patient required min-moderate verbal/tactile cues for correct exercise technique.  Seated ankle DF green tband 2x15 with min VCs for proper positioning and exercise technique;   Leg press: BLE 55# 2x10, with min VCs for proper positioning and exercise technique;   Standing on firm surface: Single leg heel raise x10 reps each LE;   Standing with  feet shoulder width apart: Holding weighted blue ball (3000 gm)in BUE:  Alphabet capital letters A-Z x1 set Holding weighted red ball (small)   Ball pass side/side from one hand to another x1 min   Resisted walking 12.5# forward/backward, side/side (4 way) x2 laps each with CGA for safety and cues for proper positioning for optimal strengthening. Also required min VCs to slow down eccentric return for better motor control;    Pt response/clinical impression: Pt challenged with increased resistance for LE strengthening. She does require min VCs for proper positioning for optimal muscle activation. Patient denies any increase in RLE knee pain throughout session. She does report increased fatigue;                        PT Education - 04/17/20 0835    Education Details LE strengthening, HEP    Person(s) Educated Patient    Methods Explanation;Verbal cues    Comprehension Verbalized understanding;Returned demonstration;Verbal cues required;Need further instruction            PT Short Term Goals - 04/03/20 0911      PT SHORT TERM GOAL #1   Title Patient will be adherent to HEP at least 3x a week to improve functional strength and balance for better safety at home.    Time 2    Period Weeks    Status New    Target Date 04/17/20             PT Long Term Goals - 04/03/20 0912      PT LONG TERM GOAL #1   Title Patient will increase 10 meter walk test to >1.10m/s as to improve gait speed for better community ambulation and to reduce fall risk.    Time 4    Period Weeks    Status New    Target Date 05/01/20      PT LONG TERM GOAL #2   Title Patient will improve FOTO score to >75% to indicate improved functional mobility with ADLs.    Time 4    Period Weeks    Status New    Target Date 05/01/20      PT LONG TERM GOAL #3   Title Patient will increase RLE gross strength to 4+/5 as to improve functional strength for independent gait, increased standing  tolerance and increased ADL ability.    Time 4    Period Weeks    Status New    Target Date 05/01/20      PT LONG TERM GOAL #4   Title Patient will be able to lift 20# from floor to waist to shoulder  height with no difficulty in order to return to work tasks;    Time 4    Period Weeks    Status New    Target Date 05/01/20                 Plan - 04/17/20 0851    Clinical Impression Statement Patient motivated and participated well within session. She was instructed in advanced LE strengthening. Progressed exercise with increased resistance for better strengthening. Patient does require min VCs for proper exercise technique/positioning for optimal strengthening. Patient would benefit from additional skilled PT intervention to improve strength, balance and mobility;    Personal Factors and Comorbidities Fitness;Past/Current Experience;Comorbidity 3+    Comorbidities s/p thyroid cancer s/p thyroidectomy in 2017, s/p LLE TKA in 2020, RLE OA; former smoker;    Examination-Activity Limitations Bathing;Carry;Lift;Locomotion Level;Squat;Stairs;Stand;Transfers    Examination-Participation Restrictions Cleaning;Community Activity;Shop;Yard Work    Stability/Clinical Decision Making Stable/Uncomplicated    Rehab Potential Good    PT Frequency 2x / week    PT Duration 4 weeks    PT Treatment/Interventions ADLs/Self Care Home Management;Cryotherapy;Electrical Stimulation;Moist Heat;Gait training;Stair training;Functional mobility training;Therapeutic activities;Therapeutic exercise;Balance training;Neuromuscular re-education;Patient/family education;Manual techniques;Passive range of motion;Energy conservation    PT Next Visit Plan strengthening, address potential knee pain    PT Home Exercise Plan HEP code: [email or web address]    Consulted and Agree with Plan of Care Patient           Patient will benefit from skilled therapeutic intervention in order to improve the following deficits and  impairments:  Abnormal gait, Decreased endurance, Decreased mobility, Difficulty walking, Decreased activity tolerance, Decreased strength, Pain  Visit Diagnosis: Difficulty in walking, not elsewhere classified  Muscle weakness (generalized)     Problem List Patient Active Problem List   Diagnosis Date Noted  . Urinary incontinence in female 02/12/2020  . Primary osteoarthritis of right knee 02/12/2020  . Dysarthria due to recent stroke 02/12/2020  . Acute CVA (cerebrovascular accident) ([place]) 02/06/2020  . Screen for colon cancer 04/01/2017  . S/P total thyroidectomy 10/14/2015  . Papillary thyroid carcinoma ([place]) 10/01/2015  . Osteoarthritis of left knee 09/02/2015  . Hidradenitis axillaris 09/02/2015    Shardee Dieu PT, DPT 04/17/2020, 9:22 AM  [place] MAIN [facility] SERVICES [address] [place], [place], [address] Phone: [phone]   Fax:  [phone]  Name: ABILENE MCPHEE MRN: [id number] Date of Birth: 1960-10-02

## 2020-04-23 ENCOUNTER — Encounter: Payer: BC Managed Care – PPO | Admitting: Speech Pathology

## 2020-04-26 ENCOUNTER — Ambulatory Visit: Payer: BC Managed Care – PPO | Attending: Internal Medicine

## 2020-04-26 ENCOUNTER — Other Ambulatory Visit: Payer: Self-pay

## 2020-04-26 ENCOUNTER — Encounter: Payer: BC Managed Care – PPO | Admitting: Speech Pathology

## 2020-04-26 DIAGNOSIS — R262 Difficulty in walking, not elsewhere classified: Secondary | ICD-10-CM

## 2020-04-26 DIAGNOSIS — M6281 Muscle weakness (generalized): Secondary | ICD-10-CM | POA: Diagnosis not present

## 2020-04-26 NOTE — Therapy (Signed)
[place] MAIN [facility] SERVICES [address] [place], [place], [address] Phone: [phone]   Fax:  [phone]  Physical Therapy Treatment  Patient Details  Name: Leah Rogers MRN: [id number] Date of Birth: Jan 20, 1961 Referring Provider (PT): Halina Maidens MD    Encounter Date: 04/26/2020   PT End of Session - 04/26/20 1001    Visit Number 5    Number of Visits 9    Date for PT Re-Evaluation 05/01/20    Authorization Type BCBS plan    Authorization Time Period FOTO completed- PT    PT Start Time 0920    PT Stop Time 1000    PT Time Calculation (min) 40 min    Equipment Utilized During Treatment Gait belt    Activity Tolerance Patient tolerated treatment well    Behavior During Therapy Flat affect           Past Medical History:  Diagnosis Date  . Arthritis   . Cancer ([employer])    Thyroid  . Heart murmur   . History of CVA (cerebrovascular accident) 02/06/2020   02/06/20  . Infected sebaceous cyst 10/26/2018  . Thyroid disease     Past Surgical History:  Procedure Laterality Date  . COLONOSCOPY WITH PROPOFOL N/A 03/20/2016   Procedure: COLONOSCOPY WITH PROPOFOL;  Surgeon: Lucilla Lame, MD;  Location: [facility];  Service: Endoscopy;  Laterality: N/A;  . ENDOMETRIAL BIOPSY  2013   benign- post menopausal bleeding  . THYROIDECTOMY N/A 10/14/2015   Procedure: THYROIDECTOMY;  Surgeon: Margaretha Sheffield, MD;  Location: [facility];  Service: ENT;  Laterality: N/A;  . TUBAL LIGATION      There were no vitals filed for this visit.   Subjective Assessment - 04/26/20 0922    Subjective Pt states her R knee is a little sore upon arrival.  She returned to work this week, she is standing for her job.  She reports she takes some breaks throughout her shift.    Pertinent History 59 yo Female s/p CVA on 02/06/20 with acute onset of RUE weakness and facial droop. She was seen for PT in the hospital and then discharged home. She presents to  therapy without any AD and states that she does not use them. She denies any numbness/tingling; She denies any recent falls; She does ambulate with an antalgic gait and is s/p L TKA in October 2020 with good results. She does have OA in right knee but she is hoping to avoid surgery on the right side. PMH significant for thyroid cancer s/p thyroidectomy in 2017, levels controlled currently; also has heart murmur;    How long can you sit comfortably? pain in right knee after sitting a few hours;    How long can you stand comfortably? 1-2 hours    How long can you walk comfortably? no difficulty; unable to walk a mile due to right knee;    Diagnostic tests MRI on 02/06/20: Acute small vessel infarct involving the left basal ganglia and adjacent white matter;    Patient Stated Goals get stronger on the right side;    Currently in Pain? Yes    Pain Score 2     Pain Location Knee    Pain Onset More than a month ago            Treatment Today:  Therapeutic exercise:  Exercises Nustep x4 mins BUE/BLE level 2 for warmup, continuous monitoring for maintaining SPM >50  Standing with green tband around  BLE: -hip flexion SLR x15 reps; -hip abduction SLR x15 reps; -hip extension SLR x15 reps; -side stepping x10 feet x3 laps each direction; Patient required min-moderate verbal/tactile cues for correct exercise technique.  Standing heel raises x10 b/l, x10 unilateral R and L   Leg press: BLE 55# 2x10, with min VCs for proper positioning and exercise technique;    Standing with feet shoulder width apart: Holding weighted blue ball (3000 gm)in BUE:             Alphabet capital letters A-Z x1 set Holding weighted red ball (small)              Ball pass side/side from one hand to another x1 min  Ball pass to PT standing behind her with b/l UE using diagonal pattern                        PT Education - 04/26/20 1001    Education Details LE strengthening, exercise  technique    Person(s) Educated Patient    Methods Explanation;Demonstration;Tactile cues;Verbal cues    Comprehension Verbal cues required;Returned demonstration;Verbalized understanding;Tactile cues required;Need further instruction            PT Short Term Goals - 04/03/20 0911      PT SHORT TERM GOAL #1   Title Patient will be adherent to HEP at least 3x a week to improve functional strength and balance for better safety at home.    Time 2    Period Weeks    Status New    Target Date 04/17/20             PT Long Term Goals - 04/03/20 0912      PT LONG TERM GOAL #1   Title Patient will increase 10 meter walk test to >1.15m/s as to improve gait speed for better community ambulation and to reduce fall risk.    Time 4    Period Weeks    Status New    Target Date 05/01/20      PT LONG TERM GOAL #2   Title Patient will improve FOTO score to >75% to indicate improved functional mobility with ADLs.    Time 4    Period Weeks    Status New    Target Date 05/01/20      PT LONG TERM GOAL #3   Title Patient will increase RLE gross strength to 4+/5 as to improve functional strength for independent gait, increased standing tolerance and increased ADL ability.    Time 4    Period Weeks    Status New    Target Date 05/01/20      PT LONG TERM GOAL #4   Title Patient will be able to lift 20# from floor to waist to shoulder height with no difficulty in order to return to work tasks;    Time 4    Period Weeks    Status New    Target Date 05/01/20                 Plan - 04/26/20 1002    Clinical Impression Statement Pt demonstrated good balance with static stance and UE movements using weight ball today.  She does report R knee pain throughout session that was no worse at end of session.  Leg press at 55# was challenging at end of session as she became more fatigued.  Patient would benefit from additional skilled PT intervention to improve strength, balance and  mobility.   Likely ready for progression of balance exercises at next session.    Personal Factors and Comorbidities Fitness;Past/Current Experience;Comorbidity 3+    Comorbidities s/p thyroid cancer s/p thyroidectomy in 2017, s/p LLE TKA in 2020, RLE OA; former smoker;    Examination-Activity Limitations Bathing;Carry;Lift;Locomotion Level;Squat;Stairs;Stand;Transfers    Examination-Participation Restrictions Cleaning;Community Activity;Shop;Yard Work    Stability/Clinical Decision Making Stable/Uncomplicated    Rehab Potential Good    PT Frequency 2x / week    PT Duration 4 weeks    PT Treatment/Interventions ADLs/Self Care Home Management;Cryotherapy;Electrical Stimulation;Moist Heat;Gait training;Stair training;Functional mobility training;Therapeutic activities;Therapeutic exercise;Balance training;Neuromuscular re-education;Patient/family education;Manual techniques;Passive range of motion;Energy conservation    PT Next Visit Plan strengthening, address potential knee pain    PT Home Exercise Plan HEP code: [email or web address]    Consulted and Agree with Plan of Care Patient           Patient will benefit from skilled therapeutic intervention in order to improve the following deficits and impairments:  Abnormal gait, Decreased endurance, Decreased mobility, Difficulty walking, Decreased activity tolerance, Decreased strength, Pain  Visit Diagnosis: Difficulty in walking, not elsewhere classified  Muscle weakness (generalized)     Problem List Patient Active Problem List   Diagnosis Date Noted  . Urinary incontinence in female 02/12/2020  . Primary osteoarthritis of right knee 02/12/2020  . Dysarthria due to recent stroke 02/12/2020  . Acute CVA (cerebrovascular accident) ([place]) 02/06/2020  . Screen for colon cancer 04/01/2017  . S/P total thyroidectomy 10/14/2015  . Papillary thyroid carcinoma ([place]) 10/01/2015  . Osteoarthritis of left knee 09/02/2015  . Hidradenitis axillaris 09/02/2015     Pincus Badder 04/26/2020, 10:05 AM Merdis Delay, PT, DPT Physical Therapist - [facility]  Outpatient Physical Therapy- [place] [facility] MAIN [facility] SERVICES [address] [place], [place], [address] Phone: [phone]   Fax:  [phone]  Name: KADIENCE MACCHI MRN: [id number] Date of Birth: [birth year]

## 2020-04-30 ENCOUNTER — Encounter: Payer: BC Managed Care – PPO | Admitting: Speech Pathology

## 2020-05-03 ENCOUNTER — Ambulatory Visit: Payer: BC Managed Care – PPO

## 2020-05-03 ENCOUNTER — Encounter: Payer: BC Managed Care – PPO | Admitting: Speech Pathology

## 2020-05-07 ENCOUNTER — Encounter: Payer: Self-pay | Admitting: Urology

## 2020-05-07 ENCOUNTER — Other Ambulatory Visit: Payer: Self-pay

## 2020-05-07 ENCOUNTER — Ambulatory Visit (INDEPENDENT_AMBULATORY_CARE_PROVIDER_SITE_OTHER): Payer: BC Managed Care – PPO | Admitting: Urology

## 2020-05-07 VITALS — BP 142/93 | HR 85 | Ht 65.0 in | Wt 245.0 lb

## 2020-05-07 DIAGNOSIS — N3281 Overactive bladder: Secondary | ICD-10-CM

## 2020-05-07 DIAGNOSIS — R32 Unspecified urinary incontinence: Secondary | ICD-10-CM

## 2020-05-07 LAB — BLADDER SCAN AMB NON-IMAGING

## 2020-05-07 NOTE — Progress Notes (Signed)
   05/07/2020 1:26 PM   Leah Rogers Feb 14, 1961 [id number]  Reason for visit: Follow up incontinence, OAB  HPI: I saw Leah Rogers in urology clinic for follow-up of incontinence and overactive bladder symptoms.  She is a 59 year old female who had a recent stroke in May 2021 and residual right-sided weakness who I saw in late June 2021 for worsening urinary symptoms and incontinence.  At that time she was having severe urgency and urge incontinence, as well as nocturia 2 times per night.  Urinalysis at that time showed a likely yeast infection, and she was treated with fluconazole.  We also gave her a few weeks of Myrbetriq to try as she was recovering from her stroke.  PVR was 0 mL at that visit.  She reports she has been doing extremely well since her last visit and denies any urinary complaints today.  She denies any urgency, frequency, or incontinence.  She continues to do timed voiding every 2-3 hours during the day, and before bed.  Overall she feels she is doing very well.  She denies any hematuria or dysuria.  PVR is normal today at 0 mL.  We discussed things to watch out for in terms of worsening urinary symptoms or retention, and return precautions were discussed at length.  Behavioral strategies discussed regarding OAB strategies Follow-up as needed  Billey Co, MD  [place] [address] [place], [place] [address] [phone]

## 2020-05-07 NOTE — Patient Instructions (Signed)

## 2020-05-09 ENCOUNTER — Other Ambulatory Visit: Payer: Self-pay

## 2020-05-09 ENCOUNTER — Ambulatory Visit: Payer: BC Managed Care – PPO

## 2020-05-09 DIAGNOSIS — R262 Difficulty in walking, not elsewhere classified: Secondary | ICD-10-CM | POA: Diagnosis not present

## 2020-05-09 DIAGNOSIS — M6281 Muscle weakness (generalized): Secondary | ICD-10-CM | POA: Diagnosis not present

## 2020-05-09 NOTE — Therapy (Signed)
[place] MAIN [facility] SERVICES [address] [place], [place], [address] Phone: [phone]   Fax:  [phone]  Physical Therapy Treatment/RECERT 1x/Discharge  Patient Details  Name: Leah Rogers MRN: [id number] Date of Birth: 1961/08/01 Referring Provider (PT): Halina Maidens MD    Encounter Date: 05/09/2020   PT End of Session - 05/09/20 1124    Visit Number 6    Number of Visits 9    Date for PT Re-Evaluation 05/09/20    Authorization Type BCBS plan    Authorization Time Period FOTO completed- PT    PT Start Time 1100    PT Stop Time 1110    PT Time Calculation (min) 10 min    Equipment Utilized During Treatment Gait belt    Activity Tolerance Patient tolerated treatment well    Behavior During Therapy Flat affect           Past Medical History:  Diagnosis Date  . Arthritis   . Cancer ([place])    Thyroid  . Heart murmur   . History of CVA (cerebrovascular accident) 02/06/2020   02/06/20  . Infected sebaceous cyst 10/26/2018  . Thyroid disease     Past Surgical History:  Procedure Laterality Date  . COLONOSCOPY WITH PROPOFOL N/A 03/20/2016   Procedure: COLONOSCOPY WITH PROPOFOL;  Surgeon: Lucilla Lame, MD;  Location: [place];  Service: Endoscopy;  Laterality: N/A;  . ENDOMETRIAL BIOPSY  2013   benign- post menopausal bleeding  . THYROIDECTOMY N/A 10/14/2015   Procedure: THYROIDECTOMY;  Surgeon: Margaretha Sheffield, MD;  Location: [facility];  Service: ENT;  Laterality: N/A;  . TUBAL LIGATION      There were no vitals filed for this visit.   Subjective Assessment - 05/09/20 1122    Subjective Patient has recently been diagnosed with a UTI, was last seen two weeks ago. Patient reports pain in her right knee occasionally but reports she no longer has deficits and is ready to be done with therapy.    Pertinent History 59 yo Female s/p CVA on 02/06/20 with acute onset of RUE weakness and facial droop. She was seen for PT in the  hospital and then discharged home. She presents to therapy without any AD and states that she does not use them. She denies any numbness/tingling; She denies any recent falls; She does ambulate with an antalgic gait and is s/p L TKA in October 2020 with good results. She does have OA in right knee but she is hoping to avoid surgery on the right side. PMH significant for thyroid cancer s/p thyroidectomy in 2017, levels controlled currently; also has heart murmur;    How long can you sit comfortably? pain in right knee after sitting a few hours;    How long can you stand comfortably? 1-2 hours    How long can you walk comfortably? no difficulty; unable to walk a mile due to right knee;    Diagnostic tests MRI on 02/06/20: Acute small vessel infarct involving the left basal ganglia and adjacent white matter;    Patient Stated Goals get stronger on the right side;    Currently in Pain? Yes    Pain Score 2     Pain Location Knee    Pain Orientation Right    Pain Descriptors / Indicators Aching    Pain Type Chronic pain    Pain Onset More than a month ago    Pain Frequency Intermittent    Aggravating Factors  weightbearing  Pain Relieving Factors rest              Goals:    10 MWT: 10.47 first trial, 9.5 second trial no AD  FOTO 71.47  Strength: R hamstring painful 4+/5, rest grossly 4+ to 5/5  Lift 20 lb from floor : per patient is able to perform at work without difficulty     Patient declines further therapy stating she is not limited in anything and has returned to work fully. Does not want any further sessions or to complete a session today.  Will perform a 1x cert to cover discharge session. We will be happy to see this patient again in the future as needed.   PT Education - 05/09/20 1123    Education Details discharge    Person(s) Educated Patient    Methods Explanation;Demonstration;Tactile cues;Verbal cues    Comprehension Verbalized understanding;Returned  demonstration;Verbal cues required;Tactile cues required            PT Short Term Goals - 05/09/20 1126      PT SHORT TERM GOAL #1   Title Patient will be adherent to HEP at least 3x a week to improve functional strength and balance for better safety at home.    Baseline HEP compliant    Time 2    Period Weeks    Status Achieved    Target Date 04/17/20             PT Long Term Goals - 05/09/20 1126      PT LONG TERM GOAL #1   Title Patient will increase 10 meter walk test to >1.12ms as to improve gait speed for better community ambulation and to reduce fall risk.    Baseline 8/19: >1.0 m/s w/o AD    Time 4    Period Weeks    Status Achieved      PT LONG TERM GOAL #2   Title Patient will improve FOTO score to >75% to indicate improved functional mobility with ADLs.    Baseline 8/19: 71.47    Time 4    Period Weeks    Status Not Met      PT LONG TERM GOAL #3   Title Patient will increase RLE gross strength to 4+/5 as to improve functional strength for independent gait, increased standing tolerance and increased ADL ability.    Baseline R hamstring painful 4+/5, rest grossly 4+ to 5/5    Time 4    Period Weeks    Status Achieved      PT LONG TERM GOAL #4   Title Patient will be able to lift 20# from floor to waist to shoulder height with no difficulty in order to return to work tasks;    Baseline per patient is able to perform at work without difficulty    Time 4    Period Weeks    Status Achieved                 Plan - 05/09/20 1125    Clinical Impression Statement Patient declines further therapy stating she is not limited in anything and has returned to work fully. Does not want any further sessions or to complete a session today.  Will perform a 1x cert to cover discharge session. We will be happy to see this patient again in the future as needed.    Personal Factors and Comorbidities Fitness;Past/Current Experience;Comorbidity 3+    Comorbidities s/p  thyroid cancer s/p thyroidectomy in 2017, s/p LLE TKA in  2020, RLE OA; former smoker;    Examination-Activity Limitations Bathing;Carry;Lift;Locomotion Level;Squat;Stairs;Stand;Transfers    Examination-Participation Restrictions Cleaning;Community Activity;Shop;Yard Work    Stability/Clinical Decision Making Stable/Uncomplicated    Rehab Potential Good    PT Frequency One time visit    PT Duration 4 weeks    PT Treatment/Interventions ADLs/Self Care Home Management;Cryotherapy;Electrical Stimulation;Moist Heat;Gait training;Stair training;Functional mobility training;Therapeutic activities;Therapeutic exercise;Balance training;Neuromuscular re-education;Patient/family education;Manual techniques;Passive range of motion;Energy conservation    PT Next Visit Plan strengthening, address potential knee pain    PT Home Exercise Plan HEP code: [email or web address]    Consulted and Agree with Plan of Care Patient           Patient will benefit from skilled therapeutic intervention in order to improve the following deficits and impairments:  Abnormal gait, Decreased endurance, Decreased mobility, Difficulty walking, Decreased activity tolerance, Decreased strength, Pain  Visit Diagnosis: Difficulty in walking, not elsewhere classified  Muscle weakness (generalized)     Problem List Patient Active Problem List   Diagnosis Date Noted  . Urinary incontinence in female 02/12/2020  . Primary osteoarthritis of right knee 02/12/2020  . Dysarthria due to recent stroke 02/12/2020  . Acute CVA (cerebrovascular accident) ([place]) 02/06/2020  . Screen for colon cancer 04/01/2017  . S/P total thyroidectomy 10/14/2015  . Papillary thyroid carcinoma ([place]) 10/01/2015  . Osteoarthritis of left knee 09/02/2015  . Hidradenitis axillaris 09/02/2015   Janna Arch, PT, DPT   05/09/2020, 11:28 AM  [place] MAIN [facility] SERVICES [address] [place], [place], [address] Phone:  [phone]   Fax:  [phone]  Name: KACY HEGNA MRN: [id number] Date of Birth: 1961-07-04

## 2020-05-10 ENCOUNTER — Encounter: Payer: BC Managed Care – PPO | Admitting: Speech Pathology

## 2020-05-15 ENCOUNTER — Ambulatory Visit: Payer: BC Managed Care – PPO

## 2020-05-17 ENCOUNTER — Ambulatory Visit: Payer: BC Managed Care – PPO

## 2020-05-17 ENCOUNTER — Encounter: Payer: BC Managed Care – PPO | Admitting: Speech Pathology

## 2020-05-20 ENCOUNTER — Ambulatory Visit: Payer: BC Managed Care – PPO | Admitting: Physical Therapy

## 2020-05-20 ENCOUNTER — Encounter: Payer: BC Managed Care – PPO | Admitting: Speech Pathology

## 2020-05-22 ENCOUNTER — Ambulatory Visit: Payer: BC Managed Care – PPO | Admitting: Physical Therapy

## 2020-05-22 ENCOUNTER — Encounter: Payer: BC Managed Care – PPO | Admitting: Speech Pathology

## 2020-06-14 ENCOUNTER — Other Ambulatory Visit: Payer: Self-pay

## 2020-06-14 ENCOUNTER — Encounter: Payer: Self-pay | Admitting: Internal Medicine

## 2020-06-14 ENCOUNTER — Ambulatory Visit (INDEPENDENT_AMBULATORY_CARE_PROVIDER_SITE_OTHER): Payer: BC Managed Care – PPO | Admitting: Internal Medicine

## 2020-06-14 VITALS — BP 124/82 | HR 69 | Temp 98.6°F | Ht 65.0 in | Wt 241.0 lb

## 2020-06-14 DIAGNOSIS — I6930 Unspecified sequelae of cerebral infarction: Secondary | ICD-10-CM | POA: Diagnosis not present

## 2020-06-14 DIAGNOSIS — E782 Mixed hyperlipidemia: Secondary | ICD-10-CM | POA: Diagnosis not present

## 2020-06-14 MED ORDER — CLOPIDOGREL BISULFATE 75 MG PO TABS: 75.0000 mg | ORAL_TABLET | Freq: Every day | ORAL | 1 refills | Status: DC

## 2020-06-14 NOTE — Progress Notes (Signed)
Date:  06/14/2020   Name:  Leah Rogers   DOB:  01-Sep-1961   MRN:  [id number]   Chief Complaint: Hyperlipidemia  Hyperlipidemia This is a chronic problem. Pertinent negatives include no chest pain or shortness of breath. Current antihyperlipidemic treatment includes statins (started several months ago).  CVA - had right sided weakness and dysarthria.  Out patient PT completed.  On Aspirin and atorvastatin.  Cardiology started her on Plavix and ordered a holter.  Pt has not heard back or scheduled follow up.  Lab Results  Component Value Date   CREATININE 0.83 03/13/2020   BUN 20 03/13/2020   NA 140 03/13/2020   K 3.7 03/13/2020   CL 104 03/13/2020   CO2 27 03/13/2020   Lab Results  Component Value Date   CHOL 203 (H) 02/07/2020   HDL 52 02/07/2020   LDLCALC 141 (H) 02/07/2020   TRIG 51 02/07/2020   CHOLHDL 3.9 02/07/2020   Lab Results  Component Value Date   TSH 22.390 (H) 03/13/2020   Lab Results  Component Value Date   HGBA1C 5.8 (H) 02/07/2020   Lab Results  Component Value Date   WBC 5.4 03/13/2020   HGB 13.6 03/13/2020   HCT 41.0 03/13/2020   MCV 92.3 03/13/2020   PLT 185 03/13/2020   Lab Results  Component Value Date   ALT 26 03/13/2020   AST 21 03/13/2020   ALKPHOS 82 03/13/2020   BILITOT 0.4 03/13/2020     Review of Systems  Constitutional: Negative for appetite change, fatigue, fever and unexpected weight change.  HENT: Negative for tinnitus and trouble swallowing.   Eyes: Negative for visual disturbance.  Respiratory: Negative for cough, chest tightness and shortness of breath.   Cardiovascular: Negative for chest pain, palpitations and leg swelling.  Gastrointestinal: Negative for abdominal pain.  Genitourinary: Negative for dysuria and hematuria.  Musculoskeletal: Negative for arthralgias, gait problem and joint swelling.  Neurological: Positive for numbness (intermittent tingling in right arm). Negative for tremors and headaches.   Psychiatric/Behavioral: Negative for dysphoric mood.    Patient Active Problem List   Diagnosis Date Noted  . Mixed hyperlipidemia 06/14/2020  . Urinary incontinence in female 02/12/2020  . Primary osteoarthritis of right knee 02/12/2020  . Dysarthria due to recent stroke 02/12/2020  . History of ischemic cerebrovascular accident (CVA) with residual deficit 02/06/2020  . Screen for colon cancer 04/01/2017  . S/P total thyroidectomy 10/14/2015  . Papillary thyroid carcinoma ([place]) 10/01/2015  . Osteoarthritis of left knee 09/02/2015  . Hidradenitis axillaris 09/02/2015    No Known Allergies  Past Surgical History:  Procedure Laterality Date  . COLONOSCOPY WITH PROPOFOL N/A 03/20/2016   Procedure: COLONOSCOPY WITH PROPOFOL;  Surgeon: Lucilla Lame, MD;  Location: [place];  Service: Endoscopy;  Laterality: N/A;  . ENDOMETRIAL BIOPSY  2013   benign- post menopausal bleeding  . THYROIDECTOMY N/A 10/14/2015   Procedure: THYROIDECTOMY;  Surgeon: Margaretha Sheffield, MD;  Location: [facility];  Service: ENT;  Laterality: N/A;  . TUBAL LIGATION      Social History   Tobacco Use  . Smoking status: Former Smoker    Packs/day: 0.50    Years: 18.00    Pack years: 9.00    Types: Cigarettes    Quit date: 03/21/2005    Years since quitting: 15.2  . Smokeless tobacco: Never Used  Vaping Use  . Vaping Use: Never used  Substance Use Topics  . Alcohol use: No    Alcohol/week:  0.0 standard drinks  . Drug use: No     Medication list has been reviewed and updated.  Current Meds  Medication Sig  . aspirin EC 325 MG EC tablet Take 1 tablet (325 mg total) by mouth daily.  Marland Kitchen atorvastatin (LIPITOR) 80 MG tablet Take 1 tablet (80 mg total) by mouth daily.  . calcium gluconate 500 MG tablet Take 500 mg by mouth daily.   . cholecalciferol (VITAMIN D3) 25 MCG (1000 UNIT) tablet Take 1,000 Units by mouth daily.  . clopidogrel (PLAVIX) 75 MG tablet Take 75 mg by mouth daily.  . ferrous sulfate  325 (65 FE) MG tablet Take 325 mg by mouth daily with breakfast.  . levothyroxine (SYNTHROID, LEVOTHROID) 125 MCG tablet Take 125 mcg by mouth daily before breakfast.     PHQ 2/9 Scores 02/12/2020 06/01/2019  PHQ - 2 Score 0 0  PHQ- 9 Score 0 -    GAD 7 : Generalized Anxiety Score 02/12/2020  Nervous, Anxious, on Edge 0  Control/stop worrying 0  Worry too much - different things 0  Trouble relaxing 0  Restless 0  Easily annoyed or irritable 0  Afraid - awful might happen 0  Total GAD 7 Score 0  Anxiety Difficulty Not difficult at all    BP Readings from Last 3 Encounters:  06/14/20 124/82  05/07/20 (!) 142/93  03/13/20 (!) 145/90    Physical Exam Vitals and nursing note reviewed.  Constitutional:      General: She is not in acute distress.    Appearance: Normal appearance. She is well-developed.  HENT:     Head: Normocephalic and atraumatic.  Cardiovascular:     Rate and Rhythm: Normal rate and regular rhythm.     Pulses: Normal pulses.     Heart sounds: No murmur heard.   Pulmonary:     Effort: Pulmonary effort is normal. No respiratory distress.     Breath sounds: No wheezing.  Musculoskeletal:     Cervical back: Normal range of motion.     Right lower leg: No edema.  Lymphadenopathy:     Cervical: No cervical adenopathy.  Skin:    General: Skin is warm and dry.     Capillary Refill: Capillary refill takes less than 2 seconds.     Findings: No rash.  Neurological:     General: No focal deficit present.     Mental Status: She is alert and oriented to person, place, and time.     Sensory: No sensory deficit.     Motor: No weakness.     Coordination: Coordination normal.     Gait: Gait normal.  Psychiatric:        Behavior: Behavior normal.        Thought Content: Thought content normal.     Wt Readings from Last 3 Encounters:  06/14/20 241 lb (109.3 kg)  05/07/20 245 lb (111.1 kg)  03/13/20 244 lb 0.8 oz (110.7 kg)    BP 124/82   Pulse 69   Temp  98.6 F (37 C) (Oral)   Ht 5\' 5"  (1.651 m)   Wt 241 lb (109.3 kg)   SpO2 98%   BMI 40.10 kg/m   Assessment and Plan: 1. History of ischemic cerebrovascular accident (CVA) with residual deficit Appears to have near complete recovery She is back to work at [employer] Plavix and aspirin -  follow up with cardiology regarding the Holter Monitor - clopidogrel (PLAVIX) 75 MG tablet; Take 1 tablet (75 mg  total) by mouth daily.  Dispense: 90 tablet; Refill: 1  2. Mixed hyperlipidemia On high intensity statin without side effects Continue current therapy - Comprehensive metabolic panel - Lipid panel   Partially dictated using Dragon software. Any errors are unintentional.  Halina Maidens, MD [facility]  06/14/2020

## 2020-06-14 NOTE — Patient Instructions (Signed)
Call Cardiology to make an appointment to follow up on the monitor results and to discuss how long to take Plavix.

## 2020-06-15 LAB — LIPID PANEL
Chol/HDL Ratio: 2.6 ratio (ref 0.0–4.4)
Cholesterol, Total: 131 mg/dL (ref 100–199)
HDL: 50 mg/dL
LDL Chol Calc (NIH): 66 mg/dL (ref 0–99)
Triglycerides: 74 mg/dL (ref 0–149)
VLDL Cholesterol Cal: 15 mg/dL (ref 5–40)

## 2020-06-15 LAB — COMPREHENSIVE METABOLIC PANEL WITH GFR
ALT: 26 IU/L (ref 0–32)
AST: 20 IU/L (ref 0–40)
Albumin/Globulin Ratio: 1.4 (ref 1.2–2.2)
Albumin: 4.2 g/dL (ref 3.8–4.9)
Alkaline Phosphatase: 101 IU/L (ref 44–121)
BUN/Creatinine Ratio: 31 — ABNORMAL HIGH (ref 12–28)
BUN: 18 mg/dL (ref 8–27)
Bilirubin Total: 0.2 mg/dL (ref 0.0–1.2)
CO2: 26 mmol/L (ref 20–29)
Calcium: 8.5 mg/dL — ABNORMAL LOW (ref 8.7–10.3)
Chloride: 103 mmol/L (ref 96–106)
Creatinine, Ser: 0.58 mg/dL (ref 0.57–1.00)
GFR calc Af Amer: 116 mL/min/1.73
GFR calc non Af Amer: 101 mL/min/1.73
Globulin, Total: 3 g/dL (ref 1.5–4.5)
Glucose: 90 mg/dL (ref 65–99)
Potassium: 3.9 mmol/L (ref 3.5–5.2)
Sodium: 142 mmol/L (ref 134–144)
Total Protein: 7.2 g/dL (ref 6.0–8.5)

## 2020-09-03 ENCOUNTER — Other Ambulatory Visit: Payer: Self-pay | Admitting: Internal Medicine

## 2020-09-12 ENCOUNTER — Other Ambulatory Visit: Payer: Self-pay | Admitting: Hematology and Oncology

## 2020-09-12 ENCOUNTER — Inpatient Hospital Stay: Payer: Self-pay | Attending: Hematology and Oncology

## 2020-09-12 ENCOUNTER — Other Ambulatory Visit: Payer: Self-pay

## 2020-09-12 ENCOUNTER — Telehealth: Payer: Self-pay | Admitting: Hematology and Oncology

## 2020-09-12 DIAGNOSIS — Z87891 Personal history of nicotine dependence: Secondary | ICD-10-CM | POA: Insufficient documentation

## 2020-09-12 DIAGNOSIS — Z79899 Other long term (current) drug therapy: Secondary | ICD-10-CM | POA: Insufficient documentation

## 2020-09-12 DIAGNOSIS — Z85850 Personal history of malignant neoplasm of thyroid: Secondary | ICD-10-CM | POA: Insufficient documentation

## 2020-09-12 DIAGNOSIS — C73 Malignant neoplasm of thyroid gland: Secondary | ICD-10-CM

## 2020-09-12 DIAGNOSIS — Z7982 Long term (current) use of aspirin: Secondary | ICD-10-CM | POA: Insufficient documentation

## 2020-09-12 LAB — TSH: TSH: 30.17 u[IU]/mL — ABNORMAL HIGH (ref 0.350–4.500)

## 2020-09-12 LAB — T4, FREE: Free T4: 0.63 ng/dL (ref 0.61–1.12)

## 2020-09-12 MED ORDER — LEVOTHYROXINE SODIUM 137 MCG PO TABS: 125.0000 ug | ORAL_TABLET | Freq: Every day | ORAL | 0 refills | Status: DC

## 2020-09-12 NOTE — Telephone Encounter (Signed)
Re:  Elevated TSH  Patient was contacted regarding her elevated TSH.  She is currently taking Synthroid 125 mcg a day.  Her dose will be increased by 10% to 137 mcg a day.  A Rx was sent to [facility] in [place].  A repeat TSH and free T4 will be drawn in 6-8 weeks and dose adjusted to her TSH.   Lequita Asal, MD

## 2020-10-04 ENCOUNTER — Ambulatory Visit
Admission: RE | Admit: 2020-10-04 | Discharge: 2020-10-04 | Disposition: A | Discharge status: Home / Self Care | Payer: Disability Insurance | Source: Ambulatory Visit | Attending: Internal Medicine | Admitting: Internal Medicine

## 2020-10-04 ENCOUNTER — Ambulatory Visit
Admission: RE | Admit: 2020-10-04 | Discharge: 2020-10-04 | Disposition: A | Discharge status: Home / Self Care | Payer: Disability Insurance | Attending: Internal Medicine | Admitting: Internal Medicine

## 2020-10-04 ENCOUNTER — Other Ambulatory Visit: Payer: Self-pay | Admitting: Internal Medicine

## 2020-10-04 ENCOUNTER — Other Ambulatory Visit: Payer: Self-pay

## 2020-10-04 DIAGNOSIS — M1990 Unspecified osteoarthritis, unspecified site: Secondary | ICD-10-CM | POA: Insufficient documentation

## 2020-10-28 ENCOUNTER — Other Ambulatory Visit: Payer: Self-pay

## 2020-10-28 DIAGNOSIS — C73 Malignant neoplasm of thyroid gland: Secondary | ICD-10-CM

## 2020-10-31 ENCOUNTER — Other Ambulatory Visit: Payer: Self-pay

## 2020-10-31 ENCOUNTER — Inpatient Hospital Stay: Payer: Disability Insurance | Attending: Hematology and Oncology

## 2020-10-31 DIAGNOSIS — Z79899 Other long term (current) drug therapy: Secondary | ICD-10-CM | POA: Insufficient documentation

## 2020-10-31 DIAGNOSIS — Z85850 Personal history of malignant neoplasm of thyroid: Secondary | ICD-10-CM | POA: Insufficient documentation

## 2020-10-31 DIAGNOSIS — C73 Malignant neoplasm of thyroid gland: Secondary | ICD-10-CM

## 2020-10-31 LAB — T4, FREE: Free T4: 0.99 ng/dL (ref 0.61–1.12)

## 2020-10-31 LAB — TSH: TSH: 6.687 u[IU]/mL — ABNORMAL HIGH (ref 0.350–4.500)

## 2020-11-06 ENCOUNTER — Telehealth: Payer: Self-pay | Admitting: Hematology and Oncology

## 2020-11-06 NOTE — Telephone Encounter (Signed)
Re:  Thyroid function studies   Today we discussed her thyroid function studies.  Her free T4 is normal.  TSH is elevated.   We discussed the plan to possibly increase her Synthroid dose again to obtain her TSH goal.  I discussed consideration of possible escalation of Synthroid from 137 mcg a day to 150 mcg a day.  She stated that she had not missed any doses.  She was not aware that her medication was from a new manufacturer.    She always takes her Synthroid on an empty stomach.  She does take calcium and iron supplementation with her Synthroid.  I discussed spacing out calcium and oral iron from her Synthroid by about 3 hours.  This will likely affect her thyroid function level.  We discussed rechecking labs in 1 month.  If TSH remains elevated, her Synthroid dose will be increased to 150 mcg a day.   Lequita Asal, MD

## 2020-12-02 ENCOUNTER — Other Ambulatory Visit: Payer: Self-pay | Admitting: Internal Medicine

## 2020-12-26 ENCOUNTER — Ambulatory Visit (INDEPENDENT_AMBULATORY_CARE_PROVIDER_SITE_OTHER): Payer: Self-pay | Admitting: Internal Medicine

## 2020-12-26 ENCOUNTER — Encounter: Payer: Self-pay | Admitting: Internal Medicine

## 2020-12-26 ENCOUNTER — Other Ambulatory Visit: Payer: Self-pay

## 2020-12-26 VITALS — BP 118/84 | HR 90 | Ht 65.0 in | Wt 244.0 lb

## 2020-12-26 DIAGNOSIS — E782 Mixed hyperlipidemia: Secondary | ICD-10-CM

## 2020-12-26 DIAGNOSIS — I6930 Unspecified sequelae of cerebral infarction: Secondary | ICD-10-CM

## 2020-12-26 DIAGNOSIS — M25511 Pain in right shoulder: Secondary | ICD-10-CM

## 2020-12-26 DIAGNOSIS — Z0000 Encounter for general adult medical examination without abnormal findings: Secondary | ICD-10-CM

## 2020-12-26 DIAGNOSIS — I6523 Occlusion and stenosis of bilateral carotid arteries: Secondary | ICD-10-CM | POA: Insufficient documentation

## 2020-12-26 DIAGNOSIS — Z85850 Personal history of malignant neoplasm of thyroid: Secondary | ICD-10-CM

## 2020-12-26 DIAGNOSIS — Z1231 Encounter for screening mammogram for malignant neoplasm of breast: Secondary | ICD-10-CM

## 2020-12-26 DIAGNOSIS — L02419 Cutaneous abscess of limb, unspecified: Secondary | ICD-10-CM

## 2020-12-26 DIAGNOSIS — G8929 Other chronic pain: Secondary | ICD-10-CM

## 2020-12-26 MED ORDER — CLOPIDOGREL BISULFATE 75 MG PO TABS: 75.0000 mg | ORAL_TABLET | Freq: Every day | ORAL | 1 refills | Status: DC

## 2020-12-26 MED ORDER — PREDNISONE 10 MG PO TABS: 10.0000 mg | ORAL_TABLET | ORAL | 0 refills | Status: AC

## 2020-12-26 MED ORDER — AMOXICILLIN-POT CLAVULANATE 875-125 MG PO TABS: 1.0000 | ORAL_TABLET | Freq: Two times a day (BID) | ORAL | 0 refills | Status: AC

## 2020-12-26 NOTE — Progress Notes (Signed)
Date:  12/26/2020   Name:  Leah Rogers   DOB:  [birth year]   MRN:  [id number]   Chief Complaint: Annual Exam (Breast Exam. No pap.)  Leah Rogers is a 60 y.o. female who presents today for her Complete Annual Exam. She feels well. She reports exercising - none. She reports she is sleeping fairly well. Breast complaints - none.  Mammogram: 03/2019 [facility] DEXA: none Pap smear: 12/2015 neg with cotesting - Colonoscopy: 02/2016  Immunization History  Administered Date(s) Administered  . Influenza,inj,Quad PF,6+ Mos 06/01/2019  . PFIZER(Purple Top)SARS-COV-2 Vaccination 12/02/2019, 12/27/2019, 09/09/2020    Hyperlipidemia This is a chronic problem. The problem is controlled. Pertinent negatives include no chest pain or shortness of breath. (Hx of CVA with right sided residual) Current antihyperlipidemic treatment includes statins.  Thyroid Problem Presents for follow-up (followed by Endo s/p total thyroidectomy for cancer 2017) visit. Patient reports no anxiety, constipation, diarrhea, fatigue, palpitations or tremors. The symptoms have been stable. Her past medical history is significant for hyperlipidemia.  Shoulder Pain  The pain is present in the right shoulder. This is a chronic (had Xray at [facility] in January - normal.) problem. The current episode started more than 1 month ago. The problem occurs daily. The problem has been unchanged. The quality of the pain is described as aching. The pain is moderate. Associated symptoms include a limited range of motion and numbness (intermittent tingling in right arm). Pertinent negatives include no fever. The symptoms are aggravated by activity. She has tried acetaminophen for the symptoms. The treatment provided mild relief.  10/04/20: FINDINGS: There is no evidence of fracture or dislocation. There is no evidence of arthropathy or other focal bone abnormality. Soft tissues are unremarkable.  Lab Results  Component Value Date   CREATININE 0.58  06/14/2020   BUN 18 06/14/2020   NA 142 06/14/2020   K 3.9 06/14/2020   CL 103 06/14/2020   CO2 26 06/14/2020   Lab Results  Component Value Date   CHOL 131 06/14/2020   HDL 50 06/14/2020   LDLCALC 66 06/14/2020   TRIG 74 06/14/2020   CHOLHDL 2.6 06/14/2020   Lab Results  Component Value Date   TSH 6.687 (H) 10/31/2020   Lab Results  Component Value Date   HGBA1C 5.8 (H) 02/07/2020   Lab Results  Component Value Date   WBC 5.4 03/13/2020   HGB 13.6 03/13/2020   HCT 41.0 03/13/2020   MCV 92.3 03/13/2020   PLT 185 03/13/2020   Lab Results  Component Value Date   ALT 26 06/14/2020   AST 20 06/14/2020   ALKPHOS 101 06/14/2020   BILITOT 0.2 06/14/2020     Review of Systems  Constitutional: Negative for appetite change, chills, fatigue, fever and unexpected weight change.  HENT: Negative for congestion, hearing loss, tinnitus, trouble swallowing and voice change.   Eyes: Negative for visual disturbance.  Respiratory: Negative for cough, chest tightness, shortness of breath and wheezing.   Cardiovascular: Negative for chest pain, palpitations and leg swelling.  Gastrointestinal: Negative for abdominal pain, constipation, diarrhea and vomiting.  Endocrine: Negative for polydipsia and polyuria.  Genitourinary: Negative for dysuria, frequency, genital sores, hematuria, vaginal bleeding and vaginal discharge.  Musculoskeletal: Positive for arthralgias (right arm/shoulder). Negative for gait problem and joint swelling.  Skin: Negative for color change and rash.  Neurological: Positive for numbness (intermittent tingling in right arm). Negative for dizziness, tremors, light-headedness and headaches.  Hematological: Negative for adenopathy. Does not bruise/bleed easily.  Psychiatric/Behavioral: Negative for dysphoric mood and sleep disturbance. The patient is not nervous/anxious.     Patient Active Problem List   Diagnosis Date Noted  . Bilateral carotid artery stenosis  12/26/2020  . Mixed hyperlipidemia 06/14/2020  . Urinary incontinence in female 02/12/2020  . Primary osteoarthritis of both knees 02/12/2020  . Dysarthria due to recent stroke 02/12/2020  . History of ischemic cerebrovascular accident (CVA) with residual deficit 02/06/2020  . Hx of papillary thyroid carcinoma 10/14/2015  . Hidradenitis axillaris 09/02/2015    No Known Allergies  Past Surgical History:  Procedure Laterality Date  . COLONOSCOPY WITH PROPOFOL N/A 03/20/2016   Procedure: COLONOSCOPY WITH PROPOFOL;  Surgeon: Lucilla Lame, MD;  Location: [facility];  Service: Endoscopy;  Laterality: N/A;  . ENDOMETRIAL BIOPSY  2013   benign- post menopausal bleeding  . THYROIDECTOMY N/A 10/14/2015   Procedure: THYROIDECTOMY;  Surgeon: Margaretha Sheffield, MD;  Location: [facility];  Service: ENT;  Laterality: N/A;  . TUBAL LIGATION      Social History   Tobacco Use  . Smoking status: Former Smoker    Packs/day: 0.50    Years: 18.00    Pack years: 9.00    Types: Cigarettes    Quit date: 03/21/2005    Years since quitting: 15.7  . Smokeless tobacco: Never Used  Vaping Use  . Vaping Use: Never used  Substance Use Topics  . Alcohol use: No    Alcohol/week: 0.0 standard drinks  . Drug use: No     Medication list has been reviewed and updated.  Current Meds  Medication Sig  . atorvastatin (LIPITOR) 80 MG tablet Take 1 tablet by mouth once daily  . calcium gluconate 500 MG tablet Take 500 mg by mouth daily.   . cholecalciferol (VITAMIN D3) 25 MCG (1000 UNIT) tablet Take 1,000 Units by mouth daily.  . clopidogrel (PLAVIX) 75 MG tablet Take 1 tablet (75 mg total) by mouth daily.  Noelle Penner ASPIRIN ADULT LOW DOSE 81 MG EC tablet Take 81 mg by mouth daily.  . ferrous sulfate 325 (65 FE) MG tablet Take 325 mg by mouth daily with breakfast.  . levothyroxine (SYNTHROID) 137 MCG tablet Take 1 tablet (137 mcg total) by mouth daily before breakfast.    PHQ 2/9 Scores 12/26/2020 02/12/2020  06/01/2019  PHQ - 2 Score 0 0 0  PHQ- 9 Score 2 0 -    GAD 7 : Generalized Anxiety Score 12/26/2020 02/12/2020  Nervous, Anxious, on Edge 0 0  Control/stop worrying 0 0  Worry too much - different things 0 0  Trouble relaxing 0 0  Restless 0 0  Easily annoyed or irritable 0 0  Afraid - awful might happen 0 0  Total GAD 7 Score 0 0  Anxiety Difficulty Not difficult at all Not difficult at all    BP Readings from Last 3 Encounters:  12/26/20 118/84  06/14/20 124/82  05/07/20 (!) 142/93    Physical Exam Vitals and nursing note reviewed.  Constitutional:      General: She is not in acute distress.    Appearance: She is well-developed.  HENT:     Head: Normocephalic and atraumatic.     Right Ear: Tympanic membrane and ear canal normal.     Left Ear: Tympanic membrane and ear canal normal.     Nose:     Right Sinus: No maxillary sinus tenderness.     Left Sinus: No maxillary sinus tenderness.  Eyes:  General: No scleral icterus.       Right eye: No discharge.        Left eye: No discharge.     Conjunctiva/sclera: Conjunctivae normal.  Neck:     Thyroid: No thyromegaly.     Vascular: No carotid bruit.  Cardiovascular:     Rate and Rhythm: Normal rate and regular rhythm.     Pulses: Normal pulses.     Heart sounds: Normal heart sounds.  Pulmonary:     Effort: Pulmonary effort is normal. No respiratory distress.     Breath sounds: No wheezing.  Chest:  Breasts:     Right: No mass, nipple discharge, skin change or tenderness.     Left: No mass, nipple discharge, skin change or tenderness.    Abdominal:     General: Bowel sounds are normal.     Palpations: Abdomen is soft.     Tenderness: There is no abdominal tenderness.  Musculoskeletal:     Right shoulder: Tenderness present. Decreased range of motion.     Left shoulder: No tenderness. Normal range of motion.     Right elbow: Normal.     Cervical back: Normal range of motion. No erythema.     Right lower leg:  No edema.     Left lower leg: No edema.  Lymphadenopathy:     Cervical: No cervical adenopathy.  Skin:    General: Skin is warm and dry.     Findings: Abscess and lesion present. No rash.     Comments: Cystic lesions both axilla Open pores noted bilaterally No drainage or bleeding  Neurological:     Mental Status: She is alert and oriented to person, place, and time.     Cranial Nerves: No cranial nerve deficit.     Sensory: No sensory deficit.     Deep Tendon Reflexes: Reflexes are normal and symmetric.  Psychiatric:        Attention and Perception: Attention normal.        Mood and Affect: Mood normal.     Wt Readings from Last 3 Encounters:  12/26/20 244 lb (110.7 kg)  06/14/20 241 lb (109.3 kg)  05/07/20 245 lb (111.1 kg)    BP 118/84   Pulse 90   Ht 5\' 5"  (1.651 m)   Wt 244 lb (110.7 kg)   SpO2 96%   BMI 40.60 kg/m   Assessment and Plan: 1. Annual physical exam Exam is normal except for weight. Encourage regular exercise and appropriate dietary changes. Will defer pap smear until next year when she has insurance coverage  2. Encounter for screening mammogram for breast cancer Normal exam Refer for free mammogram program at [place]; Future  4. History of ischemic cerebrovascular accident (CVA) with residual deficit Minimal right arm weakness Applying for SSD Continue Plavix and ASA 81 mg - clopidogrel (PLAVIX) 75 MG tablet; Take 1 tablet (75 mg total) by mouth daily.  Dispense: 90 tablet; Refill: 1  5. Mixed hyperlipidemia On high intensity statin Check labs and advise - CBC with Differential/Platelet - Comprehensive metabolic panel - Lipid panel  7. Bilateral carotid artery stenosis On statin and aspirin  8. Hx of papillary thyroid carcinoma Followed by Dr Kathyrn Sheriff  9. Chronic right shoulder pain Will try steroid taper for presumed tendonitis Consider SM evaluation if not improved once she has insurance coverage -  predniSONE (DELTASONE) 10 MG tablet; Take 1 tablet (10 mg total) by mouth as directed for 6  days. Take 6,5,4,3,2,1 then stop  Dispense: 21 tablet; Refill: 0  10. Abscess of axillary region Local care and warm soaks - amoxicillin-clavulanate (AUGMENTIN) 875-125 MG tablet; Take 1 tablet by mouth 2 (two) times daily for 10 days.  Dispense: 20 tablet; Refill: 0   Partially dictated using Editor, commissioning. Any errors are unintentional.  Halina Maidens, MD [facility]  12/26/2020

## 2020-12-27 LAB — CBC WITH DIFFERENTIAL/PLATELET
Basophils Absolute: 0 10*3/uL (ref 0.0–0.2)
Basos: 0 %
EOS (ABSOLUTE): 0 10*3/uL (ref 0.0–0.4)
Eos: 1 %
Hematocrit: 40.9 % (ref 34.0–46.6)
Hemoglobin: 13.5 g/dL (ref 11.1–15.9)
Immature Grans (Abs): 0 10*3/uL (ref 0.0–0.1)
Immature Granulocytes: 1 %
Lymphocytes Absolute: 1.5 10*3/uL (ref 0.7–3.1)
Lymphs: 29 %
MCH: 31.1 pg (ref 26.6–33.0)
MCHC: 33 g/dL (ref 31.5–35.7)
MCV: 94 fL (ref 79–97)
Monocytes Absolute: 0.8 10*3/uL (ref 0.1–0.9)
Monocytes: 15 %
Neutrophils Absolute: 2.9 10*3/uL (ref 1.4–7.0)
Neutrophils: 54 %
Platelets: 205 10*3/uL (ref 150–450)
RBC: 4.34 x10E6/uL (ref 3.77–5.28)
RDW: 13 % (ref 11.7–15.4)
WBC: 5.2 10*3/uL (ref 3.4–10.8)

## 2020-12-27 LAB — COMPREHENSIVE METABOLIC PANEL
ALT: 24 IU/L (ref 0–32)
AST: 18 IU/L (ref 0–40)
Albumin/Globulin Ratio: 1.2 (ref 1.2–2.2)
Albumin: 4.3 g/dL (ref 3.8–4.8)
Alkaline Phosphatase: 100 IU/L (ref 44–121)
BUN/Creatinine Ratio: 24 (ref 12–28)
BUN: 16 mg/dL (ref 8–27)
Bilirubin Total: 0.3 mg/dL (ref 0.0–1.2)
CO2: 22 mmol/L (ref 20–29)
Calcium: 9.2 mg/dL (ref 8.7–10.3)
Chloride: 104 mmol/L (ref 96–106)
Creatinine, Ser: 0.66 mg/dL (ref 0.57–1.00)
Globulin, Total: 3.5 g/dL (ref 1.5–4.5)
Glucose: 100 mg/dL — ABNORMAL HIGH (ref 65–99)
Potassium: 3.8 mmol/L (ref 3.5–5.2)
Sodium: 144 mmol/L (ref 134–144)
Total Protein: 7.8 g/dL (ref 6.0–8.5)
eGFR: 100 mL/min/{1.73_m2} (ref >59)

## 2020-12-27 LAB — LIPID PANEL
Chol/HDL Ratio: 2.9 ratio (ref 0.0–4.4)
Cholesterol, Total: 140 mg/dL (ref 100–199)
HDL: 49 mg/dL (ref >39)
LDL Chol Calc (NIH): 77 mg/dL (ref 0–99)
Triglycerides: 67 mg/dL (ref 0–149)
VLDL Cholesterol Cal: 14 mg/dL (ref 5–40)

## 2021-01-22 ENCOUNTER — Other Ambulatory Visit: Payer: Self-pay

## 2021-01-22 ENCOUNTER — Ambulatory Visit
Admission: RE | Admit: 2021-01-22 | Discharge: 2021-01-22 | Disposition: A | Discharge status: Home / Self Care | Payer: Disability Insurance | Source: Ambulatory Visit | Attending: Oncology | Admitting: Oncology

## 2021-01-22 ENCOUNTER — Ambulatory Visit: Payer: Disability Insurance | Attending: Oncology

## 2021-01-22 VITALS — BP 148/90 | HR 80 | Temp 96.9°F | Ht 64.0 in | Wt 236.9 lb

## 2021-01-22 DIAGNOSIS — Z0000 Encounter for general adult medical examination without abnormal findings: Secondary | ICD-10-CM | POA: Insufficient documentation

## 2021-01-22 NOTE — Progress Notes (Signed)
  Subjective:     Patient ID: Leah Rogers, female   DOB: 1961-05-24, 60 y.o.   MRN: [id number]  HPI   Review of Systems     Objective:   Physical Exam Chest:  Breasts:     Right: No swelling, bleeding, inverted nipple, mass, nipple discharge, skin change or tenderness.     Left: No swelling, bleeding, inverted nipple, mass, nipple discharge, skin change or tenderness.          Assessment:     59 year old patient presents for [facility] clinic visit.  Patient screened, and meets B[facility]CP eligibility.  Patient does not have insurance, Medicare or Medicaid.   Instructed patient on breast self awareness using teach back method.  Clinical breast exam unremarkable.   No mass or lump palpated.  States she would prefer to have pap done with Dr. Army Melia. Risk Assessment    Risk Scores      01/22/2021   Last edited by: Rico Junker, RN   5-year risk: 1.5 %   Lifetime risk: 6.7 %            Plan:     Sent for bilateral screening mammogram.

## 2021-01-23 ENCOUNTER — Other Ambulatory Visit: Payer: Self-pay

## 2021-01-23 DIAGNOSIS — N63 Unspecified lump in unspecified breast: Secondary | ICD-10-CM

## 2021-01-30 ENCOUNTER — Other Ambulatory Visit: Payer: Self-pay

## 2021-01-30 ENCOUNTER — Ambulatory Visit
Admission: RE | Admit: 2021-01-30 | Discharge: 2021-01-30 | Disposition: A | Discharge status: Home / Self Care | Payer: Self-pay | Source: Ambulatory Visit | Attending: Oncology | Admitting: Oncology

## 2021-01-30 DIAGNOSIS — N63 Unspecified lump in unspecified breast: Secondary | ICD-10-CM

## 2021-02-04 NOTE — Progress Notes (Signed)
Letter mailed from [facility] to notify of normal mammogram results.  Patient to return in one year for annual screening.  Copy to [facility]. 

## 2021-03-13 ENCOUNTER — Inpatient Hospital Stay: Payer: Disability Insurance | Attending: Oncology

## 2021-03-13 ENCOUNTER — Other Ambulatory Visit: Payer: Self-pay

## 2021-03-13 ENCOUNTER — Inpatient Hospital Stay (HOSPITAL_BASED_OUTPATIENT_CLINIC_OR_DEPARTMENT_OTHER): Payer: Disability Insurance | Admitting: Oncology

## 2021-03-13 ENCOUNTER — Encounter: Payer: Self-pay | Admitting: Oncology

## 2021-03-13 VITALS — BP 122/90 | HR 84 | Temp 97.2°F | Resp 18 | Wt 251.5 lb

## 2021-03-13 DIAGNOSIS — Z7902 Long term (current) use of antithrombotics/antiplatelets: Secondary | ICD-10-CM | POA: Insufficient documentation

## 2021-03-13 DIAGNOSIS — E890 Postprocedural hypothyroidism: Secondary | ICD-10-CM | POA: Insufficient documentation

## 2021-03-13 DIAGNOSIS — Z8673 Personal history of transient ischemic attack (TIA), and cerebral infarction without residual deficits: Secondary | ICD-10-CM | POA: Insufficient documentation

## 2021-03-13 DIAGNOSIS — N63 Unspecified lump in unspecified breast: Secondary | ICD-10-CM

## 2021-03-13 DIAGNOSIS — Z85850 Personal history of malignant neoplasm of thyroid: Secondary | ICD-10-CM | POA: Insufficient documentation

## 2021-03-13 DIAGNOSIS — C73 Malignant neoplasm of thyroid gland: Secondary | ICD-10-CM

## 2021-03-13 DIAGNOSIS — Z87891 Personal history of nicotine dependence: Secondary | ICD-10-CM | POA: Insufficient documentation

## 2021-03-13 DIAGNOSIS — Z79899 Other long term (current) drug therapy: Secondary | ICD-10-CM | POA: Insufficient documentation

## 2021-03-13 DIAGNOSIS — Z7982 Long term (current) use of aspirin: Secondary | ICD-10-CM | POA: Insufficient documentation

## 2021-03-13 LAB — CBC WITH DIFFERENTIAL/PLATELET
Abs Immature Granulocytes: 0.01 10*3/uL (ref 0.00–0.07)
Basophils Absolute: 0 10*3/uL (ref 0.0–0.1)
Basophils Relative: 0 %
Eosinophils Absolute: 0.1 10*3/uL (ref 0.0–0.5)
Eosinophils Relative: 1 %
HCT: 39.8 % (ref 36.0–46.0)
Hemoglobin: 13.2 g/dL (ref 12.0–15.0)
Immature Granulocytes: 0 %
Lymphocytes Relative: 32 %
Lymphs Abs: 1.8 10*3/uL (ref 0.7–4.0)
MCH: 30.4 pg (ref 26.0–34.0)
MCHC: 33.2 g/dL (ref 30.0–36.0)
MCV: 91.7 fL (ref 80.0–100.0)
Monocytes Absolute: 0.7 10*3/uL (ref 0.1–1.0)
Monocytes Relative: 12 %
Neutro Abs: 3 10*3/uL (ref 1.7–7.7)
Neutrophils Relative %: 55 %
Platelets: 204 10*3/uL (ref 150–400)
RBC: 4.34 MIL/uL (ref 3.87–5.11)
RDW: 14 % (ref 11.5–15.5)
WBC: 5.6 10*3/uL (ref 4.0–10.5)
nRBC: 0 % (ref 0.0–0.2)

## 2021-03-13 LAB — COMPREHENSIVE METABOLIC PANEL
ALT: 23 U/L (ref 0–44)
AST: 22 U/L (ref 15–41)
Albumin: 3.8 g/dL (ref 3.5–5.0)
Alkaline Phosphatase: 78 U/L (ref 38–126)
Anion gap: 7 (ref 5–15)
BUN: 14 mg/dL (ref 8–23)
CO2: 27 mmol/L (ref 22–32)
Calcium: 8.5 mg/dL — ABNORMAL LOW (ref 8.9–10.3)
Chloride: 103 mmol/L (ref 98–111)
Creatinine, Ser: 0.59 mg/dL (ref 0.44–1.00)
GFR, Estimated: >60 mL/min (ref >60)
Glucose, Bld: 150 mg/dL — ABNORMAL HIGH (ref 70–99)
Potassium: 3.5 mmol/L (ref 3.5–5.1)
Sodium: 137 mmol/L (ref 135–145)
Total Bilirubin: 0.2 mg/dL — ABNORMAL LOW (ref 0.3–1.2)
Total Protein: 8 g/dL (ref 6.5–8.1)

## 2021-03-13 LAB — T4, FREE: Free T4: 1.25 ng/dL — ABNORMAL HIGH (ref 0.61–1.12)

## 2021-03-13 LAB — TSH: TSH: 0.108 u[IU]/mL — ABNORMAL LOW (ref 0.350–4.500)

## 2021-03-14 LAB — THYROGLOBULIN ANTIBODY: Thyroglobulin Antibody: <1 IU/mL (ref 0.0–0.9)

## 2021-03-15 NOTE — Progress Notes (Signed)
Hematology Oncology Progress Note.   Clinic Day:  03/15/2021  Referring physician: Glean Hess, MD  Chief Complaint: Leah Rogers is a 60 y.o. female with stage II papillary thyroid carcinoma who is seen for follow up.    PERTINENT HEMATOLOGY HISTORY   # Stage II papillary thyroid carcinoma  s/p total thyroidectomy on 10/14/2015.  Pathology revealed papillary thyroid carcinoma, focally cystic.  There was a 3.5 cm follicular adenomatoid nodule in the right lobe.  There was a 0.3 cm calcified follicular nodule in the superior left lobe.  Pathologic stage was pT2Nx.  11/07/2015 PET scan  revealed postsurgical changes identified within the thyroid bed.  There were no specific findings identified to suggest FDG avid thyroid cancer metastases.  11/18/2015 She received RAI (70.2 mCi I-131) with Thyrogen stimulation .   11/28/2015 Whole body I-131 scan revealed I-131 uptake with thyroid remnant.  Otherwise negative whole-body I-131 scan without evidence of iodine-avid metastatic disease.  04/05/2019 Bilateral screening mammogram revealed no evidence of malignancy. 07/10/2019.left total knee arthroplasty 02/06/2020-02/17/2020  CVA. She presented with sudden onset right-sided weakness and numbness with facial droop. Head CT revealed unchanged appearance of an indeterminate subcentimeter low-density focus within the paramedian right pons which could have reflected an old or recent pontine infarction. No acute demarcated cortical infarct was identified. Redemonstrated chronic infarct within the right basal ganglia and radiating white matter tracks. CTA neck revealed common and internal carotid arteries patent within the neck without measurable stenosis. Minimal mixed plaque within the right carotid bifurcation. The vertebral arteries were patent within the neck. Mild atherosclerotic narrowing at the origin of the left vertebral artery. CTA head revealed no intracranial large vessel occlusion or  proximal high-grade arterial stenosis. Mild atherosclerotic narrowing of the M1 left middle cerebral Artery. 2 mm anterior communicating artery aneurysm. MRI Head without contrast revealed acute small vessel infarct involving the left basal ganglia and adjacent white matter. Chronic infarcts and chronic microvascular ischemic changes. Echo revealed EF 50-55%. On Aspirin 81mg  daily and Plavix 75mg  daily.    INTERVAL HISTORY Leah Rogers is a 60 y.o. female who has above history reviewed by me today presents for follow up visit for history of papillary thyroid carcinoma She is currently on levothyroxine 125mcg daily.  She reports feeling well, no new complaints.     Past Medical History:  Diagnosis Date   Arthritis    Cancer [facility]    Thyroid   Heart murmur    History of CVA (cerebrovascular accident) 02/06/2020   02/06/20   Infected sebaceous cyst 10/26/2018   Thyroid disease     Past Surgical History:  Procedure Laterality Date   COLONOSCOPY WITH PROPOFOL N/A 03/20/2016   Procedure: COLONOSCOPY WITH PROPOFOL;  Surgeon: Lucilla Lame, MD;  Location: [place];  Service: Endoscopy;  Laterality: N/A;   ENDOMETRIAL BIOPSY  2013   benign- post menopausal bleeding   THYROIDECTOMY N/A 10/14/2015   Procedure: THYROIDECTOMY;  Surgeon: Margaretha Sheffield, MD;  Location: [facility];  Service: ENT;  Laterality: N/A;   TUBAL LIGATION      Family History  Problem Relation Age of Onset   Hypertension Mother    Lung cancer Mother    Heart attack Father    Heart attack Brother    Breast cancer Neg Hx     Social History:  reports that she quit smoking about 15 years ago. Her smoking use included cigarettes. She has a 9.00 pack-year smoking history. She has never used smokeless tobacco. She  reports that she does not drink alcohol and does not use drugs.  She lives in [place]. She works at [employer] in [place], [place].  The patient is alone today.   Allergies: No Known Allergies  Current  Medications: Current Outpatient Medications  Medication Sig Dispense Refill   atorvastatin (LIPITOR) 80 MG tablet Take 1 tablet by mouth once daily 90 tablet 1   calcium gluconate 500 MG tablet Take 500 mg by mouth daily.      cholecalciferol (VITAMIN D3) 25 MCG (1000 UNIT) tablet Take 1,000 Units by mouth daily.     clopidogrel (PLAVIX) 75 MG tablet Take 1 tablet (75 mg total) by mouth daily. 90 tablet 1   EQ ASPIRIN ADULT LOW DOSE 81 MG EC tablet Take 81 mg by mouth daily.     ferrous sulfate 325 (65 FE) MG tablet Take 325 mg by mouth daily with breakfast.     levothyroxine (SYNTHROID) 125 MCG tablet Take 125 mcg by mouth daily.     levothyroxine (SYNTHROID) 137 MCG tablet Take 1 tablet (137 mcg total) by mouth daily before breakfast. (Patient not taking: Reported on 03/13/2021) 60 tablet 0   No current facility-administered medications for this visit.     Review of Systems  Constitutional:  Positive for malaise/fatigue. Negative for chills, diaphoresis, fever and weight loss.       Feels "alright."  HENT:  Negative for hearing loss, nosebleeds, sinus pain, sore throat and tinnitus.        Difficulty with speech s/p stroke  Eyes:  Negative for blurred vision.  Respiratory:  Negative for cough, hemoptysis and shortness of breath.   Cardiovascular:  Negative for chest pain, palpitations and leg swelling.  Gastrointestinal:  Negative for abdominal pain, blood in stool, constipation, diarrhea, heartburn, melena, nausea and vomiting.  Genitourinary:  Negative for dysuria, frequency, hematuria and urgency.  Musculoskeletal:  Negative for back pain, joint pain, myalgias and neck pain.  Skin:  Negative for itching and rash.  Neurological:  Positive for weakness (s/p stroke). Negative for dizziness, tingling, sensory change and headaches.  Endo/Heme/Allergies:  Does not bruise/bleed easily.  Psychiatric/Behavioral:  Negative for depression and memory loss. The patient is not nervous/anxious and  does not have insomnia.   All other systems reviewed and are negative.  Performance status (ECOG): 1  Vital Signs: Blood pressure 122/90, pulse 84, temperature (!) 97.2 F (36.2 C), resp. rate 18, weight 251 lb 8.7 oz (114.1 kg), SpO2 99 %.  Physical Exam Constitutional:      General: She is not in acute distress.    Appearance: She is well-developed. She is not diaphoretic.  HENT:     Head: Normocephalic and atraumatic.     Comments: Short dark hair.  Mask.    Mouth/Throat:     Mouth: Mucous membranes are moist.     Pharynx: Oropharynx is clear.  Eyes:     General: No scleral icterus.    Pupils: Pupils are equal, round, and reactive to light.  Neck:     Thyroid: No thyroid mass or thyromegaly.  Cardiovascular:     Rate and Rhythm: Normal rate and regular rhythm.     Pulses: Normal pulses.     Heart sounds: Normal heart sounds. No murmur heard. Pulmonary:     Effort: Pulmonary effort is normal. No respiratory distress.     Breath sounds: Normal breath sounds. No wheezing or rales.  Chest:     Chest wall: No tenderness.  Breasts:  Right: No axillary adenopathy or supraclavicular adenopathy.     Left: No axillary adenopathy or supraclavicular adenopathy.  Abdominal:     General: Bowel sounds are normal. There is no distension.     Palpations: Abdomen is soft. There is no mass.     Tenderness: There is no abdominal tenderness. There is no guarding or rebound.     Hernia: No hernia is present.  Musculoskeletal:        General: No swelling or tenderness. Normal range of motion.     Cervical back: Normal range of motion.  Lymphadenopathy:     Head:     Right side of head: No preauricular, posterior auricular or occipital adenopathy.     Left side of head: No preauricular, posterior auricular or occipital adenopathy.     Cervical: No cervical adenopathy.     Upper Body:     Right upper body: No supraclavicular or axillary adenopathy.     Left upper body: No  supraclavicular or axillary adenopathy.     Lower Body: No right inguinal adenopathy. No left inguinal adenopathy.  Skin:    General: Skin is warm and dry.  Neurological:     Mental Status: She is alert and oriented to person, place, and time. Mental status is at baseline.     Motor: No weakness.  Psychiatric:        Mood and Affect: Mood normal.   Laboratory Data CBC    Component Value Date/Time   WBC 5.6 03/13/2021 1450   RBC 4.34 03/13/2021 1450   HGB 13.2 03/13/2021 1450   HGB 13.5 12/26/2020 0953   HCT 39.8 03/13/2021 1450   HCT 40.9 12/26/2020 0953   PLT 204 03/13/2021 1450   PLT 205 12/26/2020 0953   MCV 91.7 03/13/2021 1450   MCV 94 12/26/2020 0953   MCH 30.4 03/13/2021 1450   MCHC 33.2 03/13/2021 1450   RDW 14.0 03/13/2021 1450   RDW 13.0 12/26/2020 0953   LYMPHSABS 1.8 03/13/2021 1450   LYMPHSABS 1.5 12/26/2020 0953   MONOABS 0.7 03/13/2021 1450   EOSABS 0.1 03/13/2021 1450   EOSABS 0.0 12/26/2020 0953   BASOSABS 0.0 03/13/2021 1450   BASOSABS 0.0 12/26/2020 0953   CMP Latest Ref Rng & Units 03/13/2021 12/26/2020 06/14/2020  Glucose 70 - 99 mg/dL 150(H) 100(H) 90  BUN 8 - 23 mg/dL 14 16 18   Creatinine 0.44 - 1.00 mg/dL 0.59 0.66 0.58  Sodium 135 - 145 mmol/L 137 144 142  Potassium 3.5 - 5.1 mmol/L 3.5 3.8 3.9  Chloride 98 - 111 mmol/L 103 104 103  CO2 22 - 32 mmol/L 27 22 26   Calcium 8.9 - 10.3 mg/dL 8.5(L) 9.2 8.5(L)  Total Protein 6.5 - 8.1 g/dL 8.0 7.8 7.2  Total Bilirubin 0.3 - 1.2 mg/dL 0.2(L) 0.3 0.2  Alkaline Phos 38 - 126 U/L 78 100 101  AST 15 - 41 U/L 22 18 20   ALT 0 - 44 U/L 23 24 26      Assessment and Plan  # Stage II papillary thyroid carcinoma Patient is s/p thyroidectomy on 10/14/2015- radioactive iodine on  11/18/2015.   She is currently on Synthroid 125 mcg a day. Labs are reviewed.  TSH is suppressed at 0.108. with increased free T4. Thyroglobulin Antibody < 1 Thyroglobulin level is pending.  Will repeat Tsh and free T4 in 1 months.    #  Heath maintenenace Recommend continue annual screening mammogram via PCP's office.   5.   RTC in  6 months for labs (TSH and free T4) MD    I discussed the assessment and treatment plan with the patient.  The patient was provided an opportunity to ask questions and all were answered.  The patient agreed with the plan and demonstrated an understanding of the instructions.  The patient was advised to call back or seek an in person evaluation if the symptoms worsen or if the condition fails to improve as anticipated.  Earlie Server, MD, PhD Hematology Oncology [place] [facility] 03/15/2021

## 2021-03-18 ENCOUNTER — Telehealth: Payer: Self-pay

## 2021-03-18 DIAGNOSIS — C73 Malignant neoplasm of thyroid gland: Secondary | ICD-10-CM

## 2021-03-18 LAB — THYROGLOBULIN LEVEL: Thyroglobulin: <2 ng/mL

## 2021-03-18 NOTE — Telephone Encounter (Signed)
-----   Message from Earlie Server, MD sent at 03/17/2021  9:05 PM EDT ----- Please advise her to continue current dose and repeat TSH, free T4 in 1 month. - her TSH is slightly suppressed and T4 is high. Since this is her chronic dose, ok to continue and repeat labs in 1 months. Follow up with me in 6 months, lab cbc cmp TSH free T4 1-2 days prior to MD visit. Thanks.  ----- Message ----- From: Evelina Dun, RN Sent: 03/17/2021   2:42 PM EDT To: Earlie Server, MD  She is currenlty on 125 mcg.  ----- Message ----- From: Earlie Server, MD Sent: 03/15/2021   2:01 PM EDT To: Evelina Dun, RN, Vanice Sarah, CMA  Please clarify with her about the levothyroxin dose, is she on 125 or 137? Thank.

## 2021-03-18 NOTE — Telephone Encounter (Signed)
Pt  notified of MD recommendation.   Pt already scheduled to see NP in 6 months. Discussed with Dr. Tasia Catchings and she will keep appts as is, if her thyroid levels are concerning when labs are repeat it, then pt will be switched to see her instead.

## 2021-03-20 NOTE — Telephone Encounter (Signed)
Please contact pt to schedule labs in 1 month. I spoke to her and she is aware of plan. Thank you.

## 2021-04-22 ENCOUNTER — Inpatient Hospital Stay: Payer: Disability Insurance | Attending: Oncology

## 2021-04-22 ENCOUNTER — Other Ambulatory Visit: Payer: Self-pay

## 2021-04-22 DIAGNOSIS — Z79899 Other long term (current) drug therapy: Secondary | ICD-10-CM | POA: Insufficient documentation

## 2021-04-22 DIAGNOSIS — C73 Malignant neoplasm of thyroid gland: Secondary | ICD-10-CM

## 2021-04-22 DIAGNOSIS — Z85850 Personal history of malignant neoplasm of thyroid: Secondary | ICD-10-CM | POA: Insufficient documentation

## 2021-04-22 LAB — TSH: TSH: 0.247 u[IU]/mL — ABNORMAL LOW (ref 0.350–4.500)

## 2021-04-22 LAB — T4, FREE: Free T4: 1.01 ng/dL (ref 0.61–1.12)

## 2021-05-17 ENCOUNTER — Other Ambulatory Visit: Payer: Self-pay | Admitting: Internal Medicine

## 2021-05-17 NOTE — Telephone Encounter (Signed)
Requested Prescriptions  Pending Prescriptions Disp Refills  . atorvastatin (LIPITOR) 80 MG tablet [Pharmacy Med Name: Atorvastatin Calcium 80 MG Oral Tablet] 90 tablet 2    Sig: Take 1 tablet by mouth once daily     Cardiovascular:  Antilipid - Statins Passed - 05/17/2021 12:13 PM      Passed - Total Cholesterol in normal range and within 360 days    Cholesterol, Total  Date Value Ref Range Status  12/26/2020 140 100 - 199 mg/dL Final         Passed - LDL in normal range and within 360 days    LDL Chol Calc (NIH)  Date Value Ref Range Status  12/26/2020 77 0 - 99 mg/dL Final         Passed - HDL in normal range and within 360 days    HDL  Date Value Ref Range Status  12/26/2020 49 >39 mg/dL Final         Passed - Triglycerides in normal range and within 360 days    Triglycerides  Date Value Ref Range Status  12/26/2020 67 0 - 149 mg/dL Final         Passed - Patient is not pregnant      Passed - Valid encounter within last 12 months    Recent Outpatient Visits          4 months ago Annual physical exam   [facility] Glean Hess, MD   11 months ago History of ischemic cerebrovascular accident (CVA) with residual deficit   [facility] Glean Hess, MD   1 year ago Cerebrovascular accident (CVA) with involvement of right side of body [facility])   [facility] Glean Hess, MD   1 year ago Pre-op evaluation   [facility] Glean Hess, MD   4 years ago Osteoarthritis of both knees, unspecified osteoarthritis type   [facility] Glean Hess, MD      Future Appointments            In 7 months Army Melia Jesse Sans, MD [facility], [facility]

## 2021-09-03 ENCOUNTER — Other Ambulatory Visit: Payer: Self-pay | Admitting: Internal Medicine

## 2021-09-03 DIAGNOSIS — I6930 Unspecified sequelae of cerebral infarction: Secondary | ICD-10-CM

## 2021-09-03 NOTE — Telephone Encounter (Signed)
Requested medication (s) are due for refill today: Yes  Requested medication (s) are on the active medication list: yes  Last refill:  12/26/20 #90/1RF  Future visit scheduled: Yes  Notes to clinic:  Unable to refill per protocol due to failed labs, no updated results.     Requested Prescriptions  Pending Prescriptions Disp Refills   clopidogrel (PLAVIX) 75 MG tablet [Pharmacy Med Name: Clopidogrel Bisulfate 75 MG Oral Tablet] 90 tablet 0    Sig: Take 1 tablet by mouth once daily     Hematology: Antiplatelets - clopidogrel Failed - 09/03/2021 12:03 PM      Failed - Evaluate AST, ALT within 2 months of therapy initiation.      Failed - Valid encounter within last 6 months    Recent Outpatient Visits           8 months ago Annual physical exam   [facility] Glean Hess, MD   1 year ago History of ischemic cerebrovascular accident (CVA) with residual deficit   [facility] Glean Hess, [facility]   1 year ago Cerebrovascular accident (CVA) with involvement of right side of body [facility])   [facility] Glean Hess, MD   2 years ago Pre-op evaluation   [facility] Glean Hess, MD   4 years ago Osteoarthritis of both knees, unspecified osteoarthritis type   [facility] Glean Hess, MD       Future Appointments             In 3 months Army Melia Jesse Sans, MD [place] - ALT in normal range and within 360 days    ALT  Date Value Ref Range Status  03/13/2021 23 0 - 44 U/L Final          Passed - AST in normal range and within 360 days    AST  Date Value Ref Range Status  03/13/2021 22 15 - 41 U/L Final          Passed - HCT in normal range and within 180 days    HCT  Date Value Ref Range Status  03/13/2021 39.8 36.0 - 46.0 % Final   Hematocrit  Date Value Ref Range Status  12/26/2020 40.9 34.0 - 46.6 % Final          Passed - HGB in normal range and within  180 days    Hemoglobin  Date Value Ref Range Status  03/13/2021 13.2 12.0 - 15.0 g/dL Final  12/26/2020 13.5 11.1 - 15.9 g/dL Final          Passed - PLT in normal range and within 180 days    Platelets  Date Value Ref Range Status  03/13/2021 204 150 - 400 K/uL Final  12/26/2020 205 150 - 450 x10E3/uL Final

## 2021-09-11 ENCOUNTER — Other Ambulatory Visit: Payer: Disability Insurance

## 2021-09-12 ENCOUNTER — Ambulatory Visit: Payer: Disability Insurance | Admitting: Nurse Practitioner

## 2021-09-12 ENCOUNTER — Other Ambulatory Visit: Payer: Disability Insurance

## 2021-09-16 ENCOUNTER — Telehealth: Payer: Self-pay | Admitting: Oncology

## 2021-09-16 NOTE — Telephone Encounter (Signed)
Patient left message with answering service to cancel 12/29 lab appointment.  Routing to clinical team for follow up.

## 2021-09-16 NOTE — Telephone Encounter (Signed)
Called pt to reschedule. Unable to reach, left VM. Will try again

## 2021-09-18 ENCOUNTER — Inpatient Hospital Stay: Payer: Disability Insurance

## 2021-09-19 ENCOUNTER — Encounter: Payer: Self-pay | Admitting: Nurse Practitioner

## 2021-09-19 ENCOUNTER — Inpatient Hospital Stay: Payer: Disability Insurance | Admitting: Nurse Practitioner

## 2021-09-19 ENCOUNTER — Other Ambulatory Visit: Payer: Disability Insurance

## 2021-10-20 ENCOUNTER — Encounter: Payer: Self-pay | Admitting: Internal Medicine

## 2021-10-20 ENCOUNTER — Other Ambulatory Visit: Payer: Self-pay

## 2021-10-20 ENCOUNTER — Ambulatory Visit (INDEPENDENT_AMBULATORY_CARE_PROVIDER_SITE_OTHER): Payer: Self-pay | Admitting: Internal Medicine

## 2021-10-20 VITALS — BP 130/90 | HR 61 | Ht 65.0 in | Wt 252.0 lb

## 2021-10-20 DIAGNOSIS — S39012A Strain of muscle, fascia and tendon of lower back, initial encounter: Secondary | ICD-10-CM

## 2021-10-20 MED ORDER — CYCLOBENZAPRINE HCL 10 MG PO TABS: 10.0000 mg | ORAL_TABLET | Freq: Three times a day (TID) | ORAL | 0 refills | Status: DC | PRN

## 2021-10-20 NOTE — Patient Instructions (Signed)
Take Cyclobenzaprine three times a day (can reduce to 1/2 tablet if too sedating)  Take 2 tylenol three times a day  Use heat off and on throughout the day.

## 2021-10-20 NOTE — Progress Notes (Signed)
Date:  10/20/2021   Name:  Leah Rogers   DOB:  Jan 22, 1961   MRN:  [id number]   Chief Complaint: Back Pain (Lower back )  Back Pain This is a new problem. Episode onset: X2 days. The problem occurs constantly. The problem has been gradually worsening since onset. The quality of the pain is described as stabbing. The pain does not radiate. The pain is at a severity of 9/10. The pain is moderate. The pain is The same all the time. The symptoms are aggravated by bending, sitting, standing, twisting and lying down. Pertinent negatives include no chest pain, fever, headaches or weakness. She has tried nothing for the symptoms.  Sudden onset without a fall or lifting a heavy object.  More pain to get from sitting to standing, turning over in bed and getting into the car.  Taking Tylenol and using heat with some benefit.  Unable to take nsaids due to anticoagulation therapy.  Lab Results  Component Value Date   NA 137 03/13/2021   K 3.5 03/13/2021   CO2 27 03/13/2021   GLUCOSE 150 (H) 03/13/2021   BUN 14 03/13/2021   CREATININE 0.59 03/13/2021   CALCIUM 8.5 (L) 03/13/2021   EGFR 100 12/26/2020   GFRNONAA >60 03/13/2021   Lab Results  Component Value Date   CHOL 140 12/26/2020   HDL 49 12/26/2020   LDLCALC 77 12/26/2020   TRIG 67 12/26/2020   CHOLHDL 2.9 12/26/2020   Lab Results  Component Value Date   TSH 0.247 (L) 04/22/2021   Lab Results  Component Value Date   HGBA1C 5.8 (H) 02/07/2020   Lab Results  Component Value Date   WBC 5.6 03/13/2021   HGB 13.2 03/13/2021   HCT 39.8 03/13/2021   MCV 91.7 03/13/2021   PLT 204 03/13/2021   Lab Results  Component Value Date   ALT 23 03/13/2021   AST 22 03/13/2021   ALKPHOS 78 03/13/2021   BILITOT 0.2 (L) 03/13/2021   Lab Results  Component Value Date   VD25OH 45.4 01/02/2016     Review of Systems  Constitutional:  Negative for chills, fatigue and fever.  Respiratory:  Negative for shortness of breath.    Cardiovascular:  Negative for chest pain and leg swelling.  Musculoskeletal:  Positive for back pain and myalgias. Negative for joint swelling.  Neurological:  Negative for dizziness, weakness and headaches.   Patient Active Problem List   Diagnosis Date Noted   Bilateral carotid artery stenosis 12/26/2020   Mixed hyperlipidemia 06/14/2020   Urinary incontinence in female 02/12/2020   Primary osteoarthritis of both knees 02/12/2020   Dysarthria due to recent stroke 02/12/2020   History of ischemic cerebrovascular accident (CVA) with residual deficit 02/06/2020   Hx of papillary thyroid carcinoma 10/14/2015   Hidradenitis axillaris 09/02/2015    No Known Allergies  Past Surgical History:  Procedure Laterality Date   COLONOSCOPY WITH PROPOFOL N/A 03/20/2016   Procedure: COLONOSCOPY WITH PROPOFOL;  Surgeon: Lucilla Lame, MD;  Location: [place];  Service: Endoscopy;  Laterality: N/A;   ENDOMETRIAL BIOPSY  2013   benign- post menopausal bleeding   THYROIDECTOMY N/A 10/14/2015   Procedure: THYROIDECTOMY;  Surgeon: Margaretha Sheffield, MD;  Location: [facility];  Service: ENT;  Laterality: N/A;   TUBAL LIGATION      Social History   Tobacco Use   Smoking status: Former    Packs/day: 0.50    Years: 18.00    Pack years: 9.00  Types: Cigarettes    Quit date: 03/21/2005    Years since quitting: 16.5   Smokeless tobacco: Never  Vaping Use   Vaping Use: Never used  Substance Use Topics   Alcohol use: No    Alcohol/week: 0.0 standard drinks   Drug use: No     Medication list has been reviewed and updated.  Current Meds  Medication Sig   atorvastatin (LIPITOR) 80 MG tablet Take 1 tablet by mouth once daily   calcium gluconate 500 MG tablet Take 500 mg by mouth daily.    cholecalciferol (VITAMIN D3) 25 MCG (1000 UNIT) tablet Take 1,000 Units by mouth daily.   clopidogrel (PLAVIX) 75 MG tablet Take 1 tablet by mouth once daily   cyclobenzaprine (FLEXERIL) 10 MG tablet Take 1  tablet (10 mg total) by mouth 3 (three) times daily as needed for muscle spasms.   EQ ASPIRIN ADULT LOW DOSE 81 MG EC tablet Take 81 mg by mouth daily.   ferrous sulfate 325 (65 FE) MG tablet Take 325 mg by mouth daily with breakfast.   levothyroxine (SYNTHROID) 125 MCG tablet Take 125 mcg by mouth daily.    PHQ 2/9 Scores 10/20/2021 12/26/2020 02/12/2020 06/01/2019  PHQ - 2 Score 0 0 0 0  PHQ- 9 Score 3 2 0 -    GAD 7 : Generalized Anxiety Score 10/20/2021 12/26/2020 02/12/2020  Nervous, Anxious, on Edge 0 0 0  Control/stop worrying 0 0 0  Worry too much - different things 0 0 0  Trouble relaxing 1 0 0  Restless 0 0 0  Easily annoyed or irritable 0 0 0  Afraid - awful might happen 0 0 0  Total GAD 7 Score 1 0 0  Anxiety Difficulty - Not difficult at all Not difficult at all    BP Readings from Last 3 Encounters:  10/20/21 130/90  03/13/21 122/90  01/22/21 (!) 148/90    Physical Exam Vitals and nursing note reviewed.  Constitutional:      General: She is not in acute distress.    Appearance: Normal appearance. She is well-developed.  HENT:     Head: Normocephalic and atraumatic.  Cardiovascular:     Rate and Rhythm: Normal rate and regular rhythm.  Pulmonary:     Effort: Pulmonary effort is normal. No respiratory distress.     Breath sounds: No wheezing or rhonchi.  Musculoskeletal:     Cervical back: Normal range of motion.     Lumbar back: Spasms and tenderness present. Negative right straight leg raise test and negative left straight leg raise test.  Lymphadenopathy:     Cervical: No cervical adenopathy.  Skin:    General: Skin is warm and dry.     Findings: No rash.  Neurological:     General: No focal deficit present.     Mental Status: She is alert and oriented to person, place, and time.     Sensory: No sensory deficit.     Motor: No weakness.     Gait: Gait abnormal.  Psychiatric:        Mood and Affect: Mood normal.        Behavior: Behavior normal.    Wt  Readings from Last 3 Encounters:  10/20/21 252 lb (114.3 kg)  03/13/21 251 lb 8.7 oz (114.1 kg)  01/22/21 236 lb 14.4 oz (107.5 kg)    BP 130/90    Pulse 61    Ht _0  (1.651 m)    Wt 252 lb (  114.3 kg)    SpO2 97%    BMI 41.93 kg/m   Assessment and Plan: 1. Sacroiliac strain, initial encounter Continue Tylenol and heat. Use flexeril tid for several days. Follow up or call if worsening. - cyclobenzaprine (FLEXERIL) 10 MG tablet; Take 1 tablet (10 mg total) by mouth 3 (three) times daily as needed for muscle spasms.  Dispense: 30 tablet; Refill: 0   Partially dictated using Editor, commissioning. Any errors are unintentional.  Halina Maidens, MD [facility]  10/20/2021

## 2021-10-21 ENCOUNTER — Ambulatory Visit: Payer: Medicaid Other | Admitting: Internal Medicine

## 2021-10-31 ENCOUNTER — Other Ambulatory Visit: Payer: Self-pay | Admitting: Internal Medicine

## 2021-10-31 DIAGNOSIS — I6930 Unspecified sequelae of cerebral infarction: Secondary | ICD-10-CM

## 2021-10-31 NOTE — Telephone Encounter (Signed)
Rx 09/04/21 #90- too soon Requested Prescriptions  Pending Prescriptions Disp Refills   clopidogrel (PLAVIX) 75 MG tablet [Pharmacy Med Name: Clopidogrel Bisulfate 75 MG Oral Tablet] 90 tablet 0    Sig: Take 1 tablet by mouth once daily     Hematology: Antiplatelets - clopidogrel Failed - 10/31/2021  9:08 AM      Failed - HCT in normal range and within 180 days    HCT  Date Value Ref Range Status  03/13/2021 39.8 36.0 - 46.0 % Final   Hematocrit  Date Value Ref Range Status  12/26/2020 40.9 34.0 - 46.6 % Final         Failed - HGB in normal range and within 180 days    Hemoglobin  Date Value Ref Range Status  03/13/2021 13.2 12.0 - 15.0 g/dL Final  12/26/2020 13.5 11.1 - 15.9 g/dL Final         Failed - PLT in normal range and within 180 days    Platelets  Date Value Ref Range Status  03/13/2021 204 150 - 400 K/uL Final  12/26/2020 205 150 - 450 x10E3/uL Final         Passed - Cr in normal range and within 360 days    Creatinine, Ser  Date Value Ref Range Status  03/13/2021 0.59 0.44 - 1.00 mg/dL Final         Passed - Valid encounter within last 6 months    Recent Outpatient Visits          1 week ago Sacroiliac strain, initial encounter   [facility] Glean Hess, MD   10 months ago Annual physical exam   [facility] Glean Hess, MD   1 year ago History of ischemic cerebrovascular accident (CVA) with residual deficit   [facility] Glean Hess, MD   1 year ago Cerebrovascular accident (CVA) with involvement of right side of body [facility])   [facility] Glean Hess, MD   2 years ago Pre-op evaluation   [facility] Glean Hess, MD      Future Appointments            In 2 months Army Melia Jesse Sans, MD [facility], [facility]

## 2021-11-02 ENCOUNTER — Other Ambulatory Visit: Payer: Self-pay | Admitting: Internal Medicine

## 2021-11-02 DIAGNOSIS — I6930 Unspecified sequelae of cerebral infarction: Secondary | ICD-10-CM

## 2021-11-03 NOTE — Telephone Encounter (Signed)
Requested Prescriptions  Pending Prescriptions Disp Refills   clopidogrel (PLAVIX) 75 MG tablet [Pharmacy Med Name: Clopidogrel Bisulfate 75 MG Oral Tablet] 90 tablet 0    Sig: Take 1 tablet by mouth once daily     Hematology: Antiplatelets - clopidogrel Failed - 11/02/2021  9:19 AM      Failed - HCT in normal range and within 180 days    HCT  Date Value Ref Range Status  03/13/2021 39.8 36.0 - 46.0 % Final   Hematocrit  Date Value Ref Range Status  12/26/2020 40.9 34.0 - 46.6 % Final         Failed - HGB in normal range and within 180 days    Hemoglobin  Date Value Ref Range Status  03/13/2021 13.2 12.0 - 15.0 g/dL Final  12/26/2020 13.5 11.1 - 15.9 g/dL Final         Failed - PLT in normal range and within 180 days    Platelets  Date Value Ref Range Status  03/13/2021 204 150 - 400 K/uL Final  12/26/2020 205 150 - 450 x10E3/uL Final         Passed - Cr in normal range and within 360 days    Creatinine, Ser  Date Value Ref Range Status  03/13/2021 0.59 0.44 - 1.00 mg/dL Final         Passed - Valid encounter within last 6 months    Recent Outpatient Visits          2 weeks ago Sacroiliac strain, initial encounter   [facility] Glean Hess, MD   10 months ago Annual physical exam   [facility] Glean Hess, MD   1 year ago History of ischemic cerebrovascular accident (CVA) with residual deficit   [facility] Glean Hess, MD   1 year ago Cerebrovascular accident (CVA) with involvement of right side of body [facility])   [facility] Glean Hess, MD   2 years ago Pre-op evaluation   [facility] Glean Hess, MD      Future Appointments            In 1 month Army Melia, Jesse Sans, MD [facility], [facility]

## 2021-11-03 NOTE — Telephone Encounter (Signed)
[place] called and spoke to [place], [facility] about the refill(s) Clopidogrel requested. Advised it was sent on 09/04/21 #90/0 refill(s). She says it was received and the patient picked it up. She says they are requesting due to no refills left on file. Advised I will send refill and place on file, she says that is fine to do.

## 2021-12-30 ENCOUNTER — Other Ambulatory Visit (HOSPITAL_COMMUNITY)
Admission: RE | Admit: 2021-12-30 | Discharge: 2021-12-30 | Disposition: A | Discharge status: Home / Self Care | Payer: Medicaid Other | Source: Ambulatory Visit | Attending: Internal Medicine | Admitting: Internal Medicine

## 2021-12-30 ENCOUNTER — Encounter: Payer: Self-pay | Admitting: Internal Medicine

## 2021-12-30 ENCOUNTER — Ambulatory Visit (INDEPENDENT_AMBULATORY_CARE_PROVIDER_SITE_OTHER): Payer: Medicaid Other | Admitting: Internal Medicine

## 2021-12-30 VITALS — BP 126/84 | HR 66 | Ht 64.0 in | Wt 253.0 lb

## 2021-12-30 DIAGNOSIS — Z1231 Encounter for screening mammogram for malignant neoplasm of breast: Secondary | ICD-10-CM

## 2021-12-30 DIAGNOSIS — I6930 Unspecified sequelae of cerebral infarction: Secondary | ICD-10-CM

## 2021-12-30 DIAGNOSIS — Z0000 Encounter for general adult medical examination without abnormal findings: Secondary | ICD-10-CM

## 2021-12-30 DIAGNOSIS — Z124 Encounter for screening for malignant neoplasm of cervix: Secondary | ICD-10-CM | POA: Diagnosis not present

## 2021-12-30 DIAGNOSIS — D6869 Other thrombophilia: Secondary | ICD-10-CM | POA: Insufficient documentation

## 2021-12-30 DIAGNOSIS — Z1159 Encounter for screening for other viral diseases: Secondary | ICD-10-CM | POA: Diagnosis not present

## 2021-12-30 DIAGNOSIS — E782 Mixed hyperlipidemia: Secondary | ICD-10-CM

## 2021-12-30 DIAGNOSIS — L732 Hidradenitis suppurativa: Secondary | ICD-10-CM

## 2021-12-30 MED ORDER — ATORVASTATIN CALCIUM 80 MG PO TABS: 80.0000 mg | ORAL_TABLET | Freq: Every day | ORAL | 1 refills | Status: DC

## 2021-12-30 MED ORDER — CLOPIDOGREL BISULFATE 75 MG PO TABS: 75.0000 mg | ORAL_TABLET | Freq: Every day | ORAL | 1 refills | Status: DC

## 2021-12-30 NOTE — Progress Notes (Signed)
? ? ?Date:  12/30/2021  ? ?Name:  Leah Rogers   DOB:  [birth year]   MRN:  [id number] ? ? ?Chief Complaint: Annual Exam (Breast exam with pap ) ?DOYCE STONEHOUSE is a 61 y.o. female who presents today for her Complete Annual Exam. She feels well. She reports exercising none. She reports she is sleeping well. Breast complaints none. ? ?Mammogram: 01/2021 Dr. Grayland Ormond ?DEXA: none ?Pap smear: 12/2015  Due ?Colonoscopy: 02/2016 repeat 10 yrs ? ?Health Maintenance Due  ?Topic Date Due  ? Hepatitis C Screening  Never done  ? TETANUS/TDAP  Never done  ? Zoster Vaccines- Shingrix (1 of 2) Never done  ? COVID-19 Vaccine (4 - Booster for Pfizer series) 11/04/2020  ? PAP SMEAR-Modifier  01/01/2021  ?  ?Immunization History  ?Administered Date(s) Administered  ? Influenza,inj,Quad PF,6+ Mos 06/01/2019  ? PFIZER(Purple Top)SARS-COV-2 Vaccination 12/02/2019, 12/27/2019, 09/09/2020  ? ? ?Hyperlipidemia ?This is a chronic problem. The problem is controlled. Pertinent negatives include no chest pain or shortness of breath. Current antihyperlipidemic treatment includes statins.  ?Thyroid Problem ?Presents for follow-up (followed by Dr. Kathyrn Sheriff s/p thyroidectomy for cancer) visit. Patient reports no anxiety, constipation, diarrhea, fatigue, palpitations or tremors. Her past medical history is significant for hyperlipidemia.  ? ?Lab Results  ?Component Value Date  ? NA 137 03/13/2021  ? K 3.5 03/13/2021  ? CO2 27 03/13/2021  ? GLUCOSE 150 (H) 03/13/2021  ? BUN 14 03/13/2021  ? CREATININE 0.59 03/13/2021  ? CALCIUM 8.5 (L) 03/13/2021  ? EGFR 100 12/26/2020  ? GFRNONAA >60 03/13/2021  ? ?Lab Results  ?Component Value Date  ? CHOL 140 12/26/2020  ? HDL 49 12/26/2020  ? [place] 77 12/26/2020  ? TRIG 67 12/26/2020  ? CHOLHDL 2.9 12/26/2020  ? ?Lab Results  ?Component Value Date  ? TSH 0.247 (L) 04/22/2021  ? ?Lab Results  ?Component Value Date  ? HGBA1C 5.8 (H) 02/07/2020  ? ?Lab Results  ?Component Value Date  ? WBC 5.6 03/13/2021  ? HGB 13.2  03/13/2021  ? HCT 39.8 03/13/2021  ? MCV 91.7 03/13/2021  ? PLT 204 03/13/2021  ? ?Lab Results  ?Component Value Date  ? ALT 23 03/13/2021  ? AST 22 03/13/2021  ? ALKPHOS 78 03/13/2021  ? BILITOT 0.2 (L) 03/13/2021  ? ?Lab Results  ?Component Value Date  ? VD25OH 45.4 01/02/2016  ?  ? ?Review of Systems  ?Constitutional:  Negative for chills, fatigue and fever.  ?HENT:  Negative for congestion, hearing loss, tinnitus, trouble swallowing and voice change.   ?Eyes:  Negative for visual disturbance.  ?Respiratory:  Negative for cough, chest tightness, shortness of breath and wheezing.   ?Cardiovascular:  Negative for chest pain, palpitations and leg swelling.  ?Gastrointestinal:  Negative for abdominal pain, constipation, diarrhea and vomiting.  ?Endocrine: Negative for polydipsia and polyuria.  ?Genitourinary:  Negative for dysuria, frequency, genital sores, vaginal bleeding and vaginal discharge.  ?Musculoskeletal:  Negative for arthralgias, gait problem and joint swelling.  ?Skin:  Positive for color change and rash.  ?     Recurrent skin boils  ?Neurological:  Negative for dizziness, tremors, light-headedness and headaches.  ?Hematological:  Negative for adenopathy. Does not bruise/bleed easily.  ?Psychiatric/Behavioral:  Negative for dysphoric mood and sleep disturbance. The patient is not nervous/anxious.   ? ?Patient Active Problem List  ? Diagnosis Date Noted  ? Bilateral carotid artery stenosis 12/26/2020  ? Mixed hyperlipidemia 06/14/2020  ? Urinary incontinence in female 02/12/2020  ? Primary  osteoarthritis of both knees 02/12/2020  ? Dysarthria due to recent stroke 02/12/2020  ? History of ischemic cerebrovascular accident (CVA) with residual deficit 02/06/2020  ? Hx of papillary thyroid carcinoma 10/14/2015  ? Hidradenitis axillaris 09/02/2015  ? ? ?No Known Allergies ? ?Past Surgical History:  ?Procedure Laterality Date  ? COLONOSCOPY WITH PROPOFOL N/A 03/20/2016  ? Procedure: COLONOSCOPY WITH PROPOFOL;   Surgeon: Lucilla Lame, MD;  Location: [place];  Service: Endoscopy;  Laterality: N/A;  ? ENDOMETRIAL BIOPSY  2013  ? benign- post menopausal bleeding  ? THYROIDECTOMY N/A 10/14/2015  ? Procedure: THYROIDECTOMY;  Surgeon: Margaretha Sheffield, MD;  Location: [facility];  Service: ENT;  Laterality: N/A;  ? TUBAL LIGATION    ? ? ?Social History  ? ?Tobacco Use  ? Smoking status: Former  ?  Packs/day: 0.50  ?  Years: 18.00  ?  Pack years: 9.00  ?  Types: Cigarettes  ?  Quit date: 03/21/2005  ?  Years since quitting: 16.7  ? Smokeless tobacco: Never  ?Vaping Use  ? Vaping Use: Never used  ?Substance Use Topics  ? Alcohol use: No  ?  Alcohol/week: 0.0 standard drinks  ? Drug use: No  ? ? ? ?Medication list has been reviewed and updated. ? ?Current Meds  ?Medication Sig  ? atorvastatin (LIPITOR) 80 MG tablet Take 1 tablet by mouth once daily  ? calcium gluconate 500 MG tablet Take 500 mg by mouth daily.   ? cholecalciferol (VITAMIN D3) 25 MCG (1000 UNIT) tablet Take 1,000 Units by mouth daily.  ? clopidogrel (PLAVIX) 75 MG tablet Take 1 tablet by mouth once daily  ? cyclobenzaprine (FLEXERIL) 10 MG tablet Take 1 tablet (10 mg total) by mouth 3 (three) times daily as needed for muscle spasms.  ? EQ ASPIRIN ADULT LOW DOSE 81 MG EC tablet Take 81 mg by mouth daily.  ? ferrous sulfate 325 (65 FE) MG tablet Take 325 mg by mouth daily with breakfast.  ? levothyroxine (SYNTHROID) 125 MCG tablet Take 125 mcg by mouth daily.  ? ? ? ?  12/30/2021  ?  9:26 AM 10/20/2021  ? 11:19 AM 12/26/2020  ?  9:21 AM 02/12/2020  ?  3:49 PM  ?GAD 7 : Generalized Anxiety Score  ?Nervous, Anxious, on Edge 0 0 0 0  ?Control/stop worrying 0 0 0 0  ?Worry too much - different things 0 0 0 0  ?Trouble relaxing 0 1 0 0  ?Restless 0 0 0 0  ?Easily annoyed or irritable 0 0 0 0  ?Afraid - awful might happen 1 0 0 0  ?Total GAD 7 Score 1 1 0 0  ?Anxiety Difficulty   Not difficult at all Not difficult at all  ? ? ? ?  12/30/2021  ?  9:25 AM  ?Depression screen PHQ  2/9  ?Decreased Interest 2  ?Down, Depressed, Hopeless 0  ?PHQ - 2 Score 2  ?Altered sleeping 2  ?Tired, decreased energy 2  ?Change in appetite 0  ?Feeling bad or failure about yourself  0  ?Trouble concentrating 0  ?Moving slowly or fidgety/restless 0  ?Suicidal thoughts 0  ?PHQ-9 Score 6  ?Difficult doing work/chores Somewhat difficult  ? ? ?BP Readings from Last 3 Encounters:  ?12/30/21 126/84  ?10/20/21 130/90  ?03/13/21 122/90  ? ? ?Physical Exam ?Vitals and nursing note reviewed.  ?Constitutional:   ?   General: She is not in acute distress. ?   Appearance: She is well-developed.  ?  HENT:  ?   Head: Normocephalic and atraumatic.  ?   Right Ear: Tympanic membrane and ear canal normal.  ?   Left Ear: Tympanic membrane and ear canal normal.  ?   Nose:  ?   Right Sinus: No maxillary sinus tenderness.  ?   Left Sinus: No maxillary sinus tenderness.  ?Eyes:  ?   General: No scleral icterus.    ?   Right eye: No discharge.     ?   Left eye: No discharge.  ?   Conjunctiva/sclera: Conjunctivae normal.  ?Neck:  ?   Thyroid: No thyromegaly.  ?   Vascular: No carotid bruit.  ?Cardiovascular:  ?   Rate and Rhythm: Normal rate and regular rhythm.  ?   Pulses: Normal pulses.  ?   Heart sounds: Normal heart sounds.  ?Pulmonary:  ?   Effort: Pulmonary effort is normal. No respiratory distress.  ?   Breath sounds: No wheezing.  ?Chest:  ?Breasts: ?   Right: No mass, nipple discharge, skin change or tenderness.  ?   Left: No mass, nipple discharge, skin change or tenderness.  ?Abdominal:  ?   General: Bowel sounds are normal.  ?   Palpations: Abdomen is soft.  ?   Tenderness: There is no abdominal tenderness.  ?Genitourinary: ?   Labia:     ?   Right: Lesion present. No tenderness.     ?   Left: Lesion present. No tenderness or injury.   ?   Vagina: Normal.  ?   Cervix: Normal.  ?   Uterus: Normal.   ?   Adnexa: Right adnexa normal and left adnexa normal.  ? ? ?Musculoskeletal:  ?   Cervical back: Normal range of motion. No  erythema.  ?   Right lower leg: No edema.  ?   Left lower leg: No edema.  ?Lymphadenopathy:  ?   Cervical: No cervical adenopathy.  ?Skin: ?   General: Skin is warm and dry.  ?   Capillary Refill: Capillary

## 2021-12-31 LAB — CBC WITH DIFFERENTIAL/PLATELET
Basophils Absolute: 0 10*3/uL (ref 0.0–0.2)
Basos: 0 %
EOS (ABSOLUTE): 0.1 10*3/uL (ref 0.0–0.4)
Eos: 1 %
Hematocrit: 38.6 % (ref 34.0–46.6)
Hemoglobin: 13.1 g/dL (ref 11.1–15.9)
Immature Grans (Abs): 0 10*3/uL (ref 0.0–0.1)
Immature Granulocytes: 0 %
Lymphocytes Absolute: 1.5 10*3/uL (ref 0.7–3.1)
Lymphs: 24 %
MCH: 31.1 pg (ref 26.6–33.0)
MCHC: 33.9 g/dL (ref 31.5–35.7)
MCV: 92 fL (ref 79–97)
Monocytes Absolute: 0.8 10*3/uL (ref 0.1–0.9)
Monocytes: 14 %
Neutrophils Absolute: 3.7 10*3/uL (ref 1.4–7.0)
Neutrophils: 61 %
Platelets: 189 10*3/uL (ref 150–450)
RBC: 4.21 x10E6/uL (ref 3.77–5.28)
RDW: 13.9 % (ref 11.7–15.4)
WBC: 6.1 10*3/uL (ref 3.4–10.8)

## 2021-12-31 LAB — LIPID PANEL
Chol/HDL Ratio: 2.7 ratio (ref 0.0–4.4)
Cholesterol, Total: 144 mg/dL (ref 100–199)
HDL: 54 mg/dL (ref >39)
LDL Chol Calc (NIH): 76 mg/dL (ref 0–99)
Triglycerides: 72 mg/dL (ref 0–149)
VLDL Cholesterol Cal: 14 mg/dL (ref 5–40)

## 2021-12-31 LAB — COMPREHENSIVE METABOLIC PANEL
ALT: 25 IU/L (ref 0–32)
AST: 19 IU/L (ref 0–40)
Albumin/Globulin Ratio: 1.3 (ref 1.2–2.2)
Albumin: 4 g/dL (ref 3.8–4.8)
Alkaline Phosphatase: 107 IU/L (ref 44–121)
BUN/Creatinine Ratio: 19 (ref 12–28)
BUN: 12 mg/dL (ref 8–27)
Bilirubin Total: 0.3 mg/dL (ref 0.0–1.2)
CO2: 24 mmol/L (ref 20–29)
Calcium: 8.8 mg/dL (ref 8.7–10.3)
Chloride: 105 mmol/L (ref 96–106)
Creatinine, Ser: 0.62 mg/dL (ref 0.57–1.00)
Globulin, Total: 3.2 g/dL (ref 1.5–4.5)
Glucose: 92 mg/dL (ref 70–99)
Potassium: 3.7 mmol/L (ref 3.5–5.2)
Sodium: 145 mmol/L — ABNORMAL HIGH (ref 134–144)
Total Protein: 7.2 g/dL (ref 6.0–8.5)
eGFR: 101 mL/min/{1.73_m2} (ref >59)

## 2021-12-31 LAB — CYTOLOGY - PAP
Comment: NEGATIVE
Diagnosis: NEGATIVE
High risk HPV: NEGATIVE

## 2021-12-31 LAB — HEPATITIS C ANTIBODY: Hep C Virus Ab: NONREACTIVE

## 2022-03-10 ENCOUNTER — Telehealth: Payer: Self-pay | Admitting: Oncology

## 2022-03-10 NOTE — Telephone Encounter (Signed)
pt called in to have appts cancelled, states that she does not currently have ins.

## 2022-03-12 ENCOUNTER — Inpatient Hospital Stay: Payer: Disability Insurance

## 2022-03-12 ENCOUNTER — Other Ambulatory Visit: Payer: Disability Insurance

## 2022-03-19 ENCOUNTER — Ambulatory Visit: Payer: Disability Insurance | Admitting: Oncology

## 2022-08-22 ENCOUNTER — Other Ambulatory Visit: Payer: Self-pay | Admitting: Internal Medicine

## 2022-08-22 DIAGNOSIS — I6930 Unspecified sequelae of cerebral infarction: Secondary | ICD-10-CM

## 2022-08-22 DIAGNOSIS — E782 Mixed hyperlipidemia: Secondary | ICD-10-CM

## 2022-08-24 NOTE — Telephone Encounter (Signed)
Requested Prescriptions  Pending Prescriptions Disp Refills   clopidogrel (PLAVIX) 75 MG tablet [Pharmacy Med Name: Clopidogrel Bisulfate 75 MG Oral Tablet] 90 tablet 0    Sig: Take 1 tablet by mouth once daily     Hematology: Antiplatelets - clopidogrel Failed - 08/22/2022 10:12 AM      Failed - HCT in normal range and within 180 days    Hematocrit  Date Value Ref Range Status  12/30/2021 38.6 34.0 - 46.6 % Final         Failed - HGB in normal range and within 180 days    Hemoglobin  Date Value Ref Range Status  12/30/2021 13.1 11.1 - 15.9 g/dL Final         Failed - PLT in normal range and within 180 days    Platelets  Date Value Ref Range Status  12/30/2021 189 150 - 450 x10E3/uL Final         Failed - Valid encounter within last 6 months    Recent Outpatient Visits           7 months ago Annual physical exam   [place] [facility] at [facility], Jesse Sans, MD   10 months ago Sacroiliac strain, initial encounter   [place] and [facility] at [facility], Jesse Sans, MD   1 year ago Annual physical exam   [place] [facility] at [facility], Jesse Sans, MD   2 years ago History of ischemic cerebrovascular accident (CVA) with residual deficit   [place] [facility] at [facility], Jesse Sans, MD   2 years ago Cerebrovascular accident (CVA) with involvement of right side of body [facility])   [place] and [facility] at [facility], Jesse Sans, MD       Future Appointments             In 5 months Army Melia Jesse Sans, MD [facility] at [facility] [place] in normal range and within 360 days    Creatinine, Ser  Date Value Ref Range Status  12/30/2021 0.62 0.57 - 1.00 mg/dL Final          atorvastatin (LIPITOR) 80 MG tablet [Pharmacy Med Name:  Atorvastatin Calcium 80 MG Oral Tablet] 90 tablet 0    Sig: Take 1 tablet by mouth once daily     Cardiovascular:  Antilipid - Statins Failed - 08/22/2022 10:12 AM      Failed - Lipid Panel in normal range within the last 12 months    Cholesterol, Total  Date Value Ref Range Status  12/30/2021 144 100 - 199 mg/dL Final   LDL Chol Calc (NIH)  Date Value Ref Range Status  12/30/2021 76 0 - 99 mg/dL Final   HDL  Date Value Ref Range Status  12/30/2021 54 >39 mg/dL Final   Triglycerides  Date Value Ref Range Status  12/30/2021 72 0 - 149 mg/dL Final         Passed - Patient is not pregnant      Passed - Valid encounter within last 12 months    Recent Outpatient Visits           7 months ago Annual physical exam   [facility] at [facility],  Jesse Sans, MD   10 months ago Sacroiliac strain, initial encounter   [place] at [facility], Jesse Sans, MD   1 year ago Annual physical exam   [place] [facility] at [facility], Jesse Sans, MD   2 years ago History of ischemic cerebrovascular accident (CVA) with residual deficit   [place] [facility] at [facility], Jesse Sans, MD   2 years ago Cerebrovascular accident (CVA) with involvement of right side of body [facility])   [place] [facility] at [place], Jesse Sans, MD       Future Appointments             In 5 months Army Melia, Jesse Sans, MD [place] [facility] at [facility], [facility]

## 2022-10-12 ENCOUNTER — Encounter: Payer: Self-pay | Admitting: Internal Medicine

## 2022-10-12 ENCOUNTER — Ambulatory Visit (INDEPENDENT_AMBULATORY_CARE_PROVIDER_SITE_OTHER): Payer: Medicaid Other | Admitting: Internal Medicine

## 2022-10-12 VITALS — BP 138/82 | HR 64 | Ht 64.0 in | Wt 252.0 lb

## 2022-10-12 DIAGNOSIS — L732 Hidradenitis suppurativa: Secondary | ICD-10-CM

## 2022-10-12 DIAGNOSIS — M170 Bilateral primary osteoarthritis of knee: Secondary | ICD-10-CM | POA: Diagnosis not present

## 2022-10-12 MED ORDER — AMOXICILLIN-POT CLAVULANATE 875-125 MG PO TABS: 1.0000 | ORAL_TABLET | Freq: Two times a day (BID) | ORAL | 0 refills | Status: AC

## 2022-10-12 NOTE — Assessment & Plan Note (Signed)
Worsening symptoms in both axilla First Derm appointment is October Will do an urgent referral Begin antibiotics to avoid further infection

## 2022-10-12 NOTE — Assessment & Plan Note (Signed)
Still having knee pain and is limited in walking distance Has a parking placard which has expired and would like this to be renewed.

## 2022-10-12 NOTE — Progress Notes (Signed)
Date:  10/12/2022   Name:  Leah Rogers   DOB:  14-Dec-1960   MRN:  [id number]   Chief Complaint: Referral (Pt wants referral to dermatology. Boil under right and left armpit. Draining and painful. Started a year ago. Comes and goes and does not ever heal completely. )  Knee Pain  There was no injury mechanism. The pain is present in the right knee. The quality of the pain is described as aching and cramping. The pain is moderate. The pain has been Fluctuating since onset.  Rash This is a recurrent problem. The affected locations include the left axilla and right axilla. The rash is characterized by burning, draining, redness, swelling and pain. Pertinent negatives include no cough, diarrhea, fatigue or shortness of breath.    Lab Results  Component Value Date   NA 145 (H) 12/30/2021   K 3.7 12/30/2021   CO2 24 12/30/2021   GLUCOSE 92 12/30/2021   BUN 12 12/30/2021   CREATININE 0.62 12/30/2021   CALCIUM 8.8 12/30/2021   EGFR 101 12/30/2021   GFRNONAA >60 03/13/2021   Lab Results  Component Value Date   CHOL 144 12/30/2021   HDL 54 12/30/2021   LDLCALC 76 12/30/2021   TRIG 72 12/30/2021   CHOLHDL 2.7 12/30/2021   Lab Results  Component Value Date   TSH 0.247 (L) 04/22/2021   Lab Results  Component Value Date   HGBA1C 5.8 (H) 02/07/2020   Lab Results  Component Value Date   WBC 6.1 12/30/2021   HGB 13.1 12/30/2021   HCT 38.6 12/30/2021   MCV 92 12/30/2021   PLT 189 12/30/2021   Lab Results  Component Value Date   ALT 25 12/30/2021   AST 19 12/30/2021   ALKPHOS 107 12/30/2021   BILITOT 0.3 12/30/2021   Lab Results  Component Value Date   VD25OH 45.4 01/02/2016     Review of Systems  Constitutional:  Negative for fatigue and unexpected weight change.  HENT:  Negative for nosebleeds.   Eyes:  Negative for visual disturbance.  Respiratory:  Negative for cough, chest tightness, shortness of breath and wheezing.   Cardiovascular:  Negative for chest  pain, palpitations and leg swelling.  Gastrointestinal:  Negative for abdominal pain, constipation and diarrhea.  Musculoskeletal:  Positive for arthralgias and gait problem.  Skin:  Positive for color change, rash and wound.  Neurological:  Negative for dizziness, weakness, light-headedness and headaches.    Patient Active Problem List   Diagnosis Date Noted   Acquired thrombophilia ([place]) 12/30/2021   Bilateral carotid artery stenosis 12/26/2020   Mixed hyperlipidemia 06/14/2020   Urinary incontinence in female 02/12/2020   Primary osteoarthritis of both knees 02/12/2020   Dysarthria due to recent stroke 02/12/2020   History of ischemic cerebrovascular accident (CVA) with residual deficit 02/06/2020   Hx of papillary thyroid carcinoma 10/14/2015   Hidradenitis axillaris 09/02/2015    No Known Allergies  Past Surgical History:  Procedure Laterality Date   COLONOSCOPY WITH PROPOFOL N/A 03/20/2016   Procedure: COLONOSCOPY WITH PROPOFOL;  Surgeon: Lucilla Lame, MD;  Location: [place];  Service: Endoscopy;  Laterality: N/A;   ENDOMETRIAL BIOPSY  2013   benign- post menopausal bleeding   THYROIDECTOMY N/A 10/14/2015   Procedure: THYROIDECTOMY;  Surgeon: Margaretha Sheffield, MD;  Location: [facility];  Service: ENT;  Laterality: N/A;   TUBAL LIGATION      Social History   Tobacco Use   Smoking status: Former    Packs/day:  0.50    Years: 18.00    Total pack years: 9.00    Types: Cigarettes    Quit date: 03/21/2005    Years since quitting: 17.5   Smokeless tobacco: Never  Vaping Use   Vaping Use: Never used  Substance Use Topics   Alcohol use: No    Alcohol/week: 0.0 standard drinks of alcohol   Drug use: No     Medication list has been reviewed and updated.  Current Meds  Medication Sig   amoxicillin-clavulanate (AUGMENTIN) 875-125 MG tablet Take 1 tablet by mouth 2 (two) times daily for 10 days.   atorvastatin (LIPITOR) 80 MG tablet Take 1 tablet by mouth once daily    calcium gluconate 500 MG tablet Take 500 mg by mouth daily.    cholecalciferol (VITAMIN D3) 25 MCG (1000 UNIT) tablet Take 1,000 Units by mouth daily.   clopidogrel (PLAVIX) 75 MG tablet Take 1 tablet by mouth once daily   cyclobenzaprine (FLEXERIL) 10 MG tablet Take 1 tablet (10 mg total) by mouth 3 (three) times daily as needed for muscle spasms.   EQ ASPIRIN ADULT LOW DOSE 81 MG EC tablet Take 81 mg by mouth daily.   ferrous sulfate 325 (65 FE) MG tablet Take 325 mg by mouth daily with breakfast.   levothyroxine (SYNTHROID) 125 MCG tablet Take 125 mcg by mouth daily.   vitamin E 45 MG (100 UNITS) capsule Take by mouth daily.       12/30/2021    9:26 AM 10/20/2021   11:19 AM 12/26/2020    9:21 AM 02/12/2020    3:49 PM  GAD 7 : Generalized Anxiety Score  Nervous, Anxious, on Edge 0 0 0 0  Control/stop worrying 0 0 0 0  Worry too much - different things 0 0 0 0  Trouble relaxing 0 1 0 0  Restless 0 0 0 0  Easily annoyed or irritable 0 0 0 0  Afraid - awful might happen 1 0 0 0  Total GAD 7 Score 1 1 0 0  Anxiety Difficulty   Not difficult at all Not difficult at all       12/30/2021    9:25 AM 10/20/2021   11:18 AM 12/26/2020    9:20 AM  Depression screen PHQ 2/9  Decreased Interest 2 0 0  Down, Depressed, Hopeless 0 0 0  PHQ - 2 Score 2 0 0  Altered sleeping '2 1 1  '$ Tired, decreased energy '2 2 1  '$ Change in appetite 0 0 0  Feeling bad or failure about yourself  0 0 0  Trouble concentrating 0 0 0  Moving slowly or fidgety/restless 0 0 0  Suicidal thoughts 0 0 0  PHQ-9 Score '6 3 2  '$ Difficult doing work/chores Somewhat difficult Not difficult at all Not difficult at all    BP Readings from Last 3 Encounters:  10/12/22 138/82  12/30/21 126/84  10/20/21 130/90    Physical Exam Vitals and nursing note reviewed.  Constitutional:      General: She is not in acute distress.    Appearance: She is well-developed.  HENT:     Head: Normocephalic and atraumatic.   Cardiovascular:     Rate and Rhythm: Normal rate and regular rhythm.  Pulmonary:     Effort: Pulmonary effort is normal. No respiratory distress.     Breath sounds: No wheezing or rhonchi.  Skin:    General: Skin is warm and dry.     Findings: Abscess, erythema  and lesion present. No rash.     Comments: Extensive involvement of both axilla - R>L  Neurological:     Mental Status: She is alert and oriented to person, place, and time.  Psychiatric:        Mood and Affect: Mood normal.        Behavior: Behavior normal.     Wt Readings from Last 3 Encounters:  10/12/22 252 lb (114.3 kg)  12/30/21 253 lb (114.8 kg)  10/20/21 252 lb (114.3 kg)    BP 138/82   Pulse 64   Ht '5\' 4"'$  (1.626 m)   Wt 252 lb (114.3 kg)   SpO2 96%   BMI 43.26 kg/m   Assessment and Plan: Problem List Items Addressed This Visit       Musculoskeletal and Integument   Hidradenitis axillaris    Worsening symptoms in both axilla First Derm appointment is October Will do an urgent referral Begin antibiotics to avoid further infection      Primary osteoarthritis of both knees    Still having knee pain and is limited in walking distance Has a parking placard which has expired and would like this to be renewed.      Other Visit Diagnoses     Axillary hidradenitis suppurativa    -  Primary   Relevant Medications   amoxicillin-clavulanate (AUGMENTIN) 875-125 MG tablet   Other Relevant Orders   Ambulatory referral to Dermatology        Partially dictated using Dragon software. Any errors are unintentional.  Halina Maidens, MD [facility]  10/12/2022

## 2022-10-13 ENCOUNTER — Ambulatory Visit (INDEPENDENT_AMBULATORY_CARE_PROVIDER_SITE_OTHER): Payer: Medicaid Other | Admitting: Dermatology

## 2022-10-13 ENCOUNTER — Encounter: Payer: Self-pay | Admitting: Dermatology

## 2022-10-13 VITALS — BP 135/88

## 2022-10-13 DIAGNOSIS — Z79899 Other long term (current) drug therapy: Secondary | ICD-10-CM | POA: Diagnosis not present

## 2022-10-13 DIAGNOSIS — L732 Hidradenitis suppurativa: Secondary | ICD-10-CM | POA: Diagnosis not present

## 2022-10-13 MED ORDER — CLINDAMYCIN PHOSPHATE 1 % EX GEL: Freq: Two times a day (BID) | CUTANEOUS | 0 refills | Status: DC

## 2022-10-13 MED ORDER — DOXYCYCLINE MONOHYDRATE 100 MG PO CAPS: 100.0000 mg | ORAL_CAPSULE | Freq: Two times a day (BID) | ORAL | 1 refills | Status: DC

## 2022-10-13 MED ORDER — SPIRONOLACTONE 25 MG PO TABS: 25.0000 mg | ORAL_TABLET | Freq: Every day | ORAL | 1 refills | Status: DC

## 2022-10-13 NOTE — Progress Notes (Signed)
New Patient Visit  Subjective  Leah Rogers is a 62 y.o. female who presents for the following: Abscess (Patient with multiple abscesses at axilla, inframammary and groin. Patient advises she has been dealing with them for about 1 year. Patient saw PCP yesterday and they started her on Augmentin. ).   The following portions of the chart were reviewed this encounter and updated as appropriate:   Tobacco  Allergies  Meds  Problems  Med Hx  Surg Hx  Fam Hx      Review of Systems:  No other skin or systemic complaints except as noted in HPI or Assessment and Plan.  Objective  Well appearing patient in no apparent distress; mood and affect are within normal limits.  A focused examination was performed including chest, axillae, abdomen, back, and buttocks. Relevant physical exam findings are noted in the Assessment and Plan.  Right Axilla Multiple scarred sinus tracts, open comedones, draining sinuses at b/l axillae Some involvement mid chest, inframammary, superior gluteal crease with cysts     Assessment & Plan  Hidradenitis suppurativa Right Axilla  Chronic and persistent condition with duration or expected duration over one year. Condition is bothersome/symptomatic for patient. Currently flared.  Hidradenitis Suppurativa is a chronic; persistent; non-curable, but treatable condition due to abnormal inflamed sweat glands in the body folds (axilla, inframammary, groin, medial thighs), causing recurrent painful draining cysts and scarring. It can be associated with severe scarring acne and cysts; also abscesses and scarring of scalp. The goal is control and prevention of flares, as it is not curable. Scars are permanent and can be thickened. Treatment may include daily use of topical medication and oral antibiotics.  Oral isotretinoin may also be helpful.  For some cases, Humira or Cosentyx (biologic injections) may be prescribed to decrease the inflammatory process and prevent  flares.  When indicated, inflamed cysts may also be treated surgically.   Discontinue Augmentin Start doxycycline monohydrate 100 mg twice daily with food. Start spironolactone 25 mg once daily Start clindamycin gel to affected areas twice daily Start HibaClens wash daily, avoid getting it in the vagina.  BP 135/88  Doxycycline should be taken with food to prevent nausea. Do not lay down for 30 minutes after taking. Be cautious with sun exposure and use good sun protection while on this medication. Pregnant women should not take this medication.   Spironolactone can cause increased urination and cause blood pressure to decrease. Please watch for signs of lightheadedness and be cautious when changing position. It can sometimes cause breast tenderness or an irregular period in premenopausal women. It can also increase potassium. The increase in potassium usually is not a concern unless you are taking other medicines that also increase potassium, so please be sure your doctor knows all of the other medications you are taking. This medication should not be taken by pregnant women.  This medicine should also not be taken together with sulfa drugs like Bactrim (trimethoprim/sulfamethexazole).   If not improving, consider treating with biologic, Cosentyx.  Reviewed risks of biologics including immunosuppression, infections, injection site reaction, and failure to improve condition. Goal is control of skin condition, not cure.  Talz and Cosentyx may cause inflammatory bowel disease to flare or rarely trigger inflammatory bowel disease. The use of biologics requires long term medication management, including periodic office visits and monitoring of blood work.  DDX superior gluteal crease HS vs pilonidal cyst- recheck  clindamycin (CLINDAGEL) 1 % gel - Right Axilla Apply topically 2 (two) times  daily.  doxycycline (MONODOX) 100 MG capsule - Right Axilla Take 1 capsule (100 mg total) by mouth 2 (two)  times daily. Take with food  spironolactone (ALDACTONE) 25 MG tablet - Right Axilla Take 1 tablet (25 mg total) by mouth daily.   Return in about 1 month (around 11/13/2022) for Hidradenitis.  Graciella Belton, RMA, am acting as scribe for Forest Gleason, MD .  Documentation: I have reviewed the above documentation for accuracy and completeness, and I agree with the above.  Forest Gleason, MD

## 2022-10-13 NOTE — Patient Instructions (Addendum)
Discontinue Augmentin Start doxycycline monohydrate 100 mg twice daily with food. Start spironolactone 25 mg once daily. Start clindamycin gel to affected areas twice daily. Start HibaClens wash daily to affected areas, avoid getting it in the vagina.   Doxycycline should be taken with food to prevent nausea. Do not lay down for 30 minutes after taking. Be cautious with sun exposure and use good sun protection while on this medication. Pregnant women should not take this medication.   Spironolactone can cause increased urination and cause blood pressure to decrease. Please watch for signs of lightheadedness and be cautious when changing position. It can sometimes cause breast tenderness or an irregular period in premenopausal women. It can also increase potassium. The increase in potassium usually is not a concern unless you are taking other medicines that also increase potassium, so please be sure your doctor knows all of the other medications you are taking. This medication should not be taken by pregnant women.  This medicine should also not be taken together with sulfa drugs like Bactrim (trimethoprim/sulfamethexazole).   If not improving, consider treating with biologic, Cosentyx.  Reviewed risks of biologics including immunosuppression, infections, injection site reaction, and failure to improve condition. Goal is control of skin condition, not cure.  Some older biologics such as Humira and Enbrel may slightly increase risk of malignancy and may worsen congestive heart failure.  Talz and Cosentyx may cause inflammatory bowel disease to flare. The use of biologics requires long term medication management, including periodic office visits and monitoring of blood work.  Due to recent changes in healthcare laws, you may see results of your pathology and/or laboratory studies on MyChart before the doctors have had a chance to review them. We understand that in some cases there may be results that are  confusing or concerning to you. Please understand that not all results are received at the same time and often the doctors may need to interpret multiple results in order to provide you with the best plan of care or course of treatment. Therefore, we ask that you please give [place] 2 business days to thoroughly review all your results before contacting the office for clarification. Should we see a critical lab result, you will be contacted sooner.   If You Need Anything After Your Visit  If you have any questions or concerns for your doctor, please call our main line at [phone] and press option 4 to reach your doctor's medical assistant. If no one answers, please leave a voicemail as directed and we will return your call as soon as possible. Messages left after 4 pm will be answered the following business day.   You may also send [place] a message via [place]. We typically respond to MyChart messages within 1-2 business days.  For prescription refills, please ask your pharmacy to contact our office. Our fax number is [phone].  If you have an urgent issue when the clinic is closed that cannot wait until the next business day, you can page your doctor at the number below.    Please note that while we do our best to be available for urgent issues outside of office hours, we are not available 24/7.   If you have an urgent issue and are unable to reach [place], you may choose to seek medical care at your doctor's office, retail clinic, urgent care center, or emergency room.  If you have a medical emergency, please immediately call 911 or go to the emergency department.  Pager Numbers  -  Dr. Nehemiah Massed: [phone]  - Dr. Laurence Ferrari: [phone]  - Dr. Nicole Kindred: [phone]  In the event of inclement weather, please call our main line at [phone] for an update on the status of any delays or closures.  Dermatology Medication Tips: Please keep the boxes that topical medications come in in order to  help keep track of the instructions about where and how to use these. Pharmacies typically print the medication instructions only on the boxes and not directly on the medication tubes.   If your medication is too expensive, please contact our office at [phone] option 4 or send [place] a message through [place].   We are unable to tell what your co-pay for medications will be in advance as this is different depending on your insurance coverage. However, we may be able to find a substitute medication at lower cost or fill out paperwork to get insurance to cover a needed medication.   If a prior authorization is required to get your medication covered by your insurance company, please allow [place] 1-2 business days to complete this process.  Drug prices often vary depending on where the prescription is filled and some pharmacies may offer cheaper prices.  The website www.goodrx.com contains coupons for medications through different pharmacies. The prices here do not account for what the cost may be with help from insurance (it may be cheaper with your insurance), but the website can give you the price if you did not use any insurance.  - You can print the associated coupon and take it with your prescription to the pharmacy.  - You may also stop by our office during regular business hours and pick up a GoodRx coupon card.  - If you need your prescription sent electronically to a different pharmacy, notify our office through [facility] or by phone at [phone] option 4.     Si Usted Necesita Algo Despus de Su Visita  Tambin puede enviarnos un mensaje a travs de Pharmacist, community. Por lo general respondemos a los mensajes de MyChart en el transcurso de 1 a 2 das hbiles.  Para renovar recetas, por favor pida a su farmacia que se ponga en contacto con nuestra oficina. Harland Dingwall de fax es [place] [phone].  Si tiene un asunto urgente cuando la clnica est cerrada y que no puede esperar hasta  el siguiente da hbil, puede llamar/localizar a su doctor(a) al nmero que aparece a continuacin.   Por favor, tenga en cuenta que aunque hacemos todo lo posible para estar disponibles para asuntos urgentes fuera del horario de [place], no estamos disponibles las 24 horas del da, los 7 das de la [place].   Si tiene un problema urgente y no puede comunicarse con nosotros, puede optar por buscar atencin mdica  en el consultorio de su doctor(a), en una clnica privada, en un centro de atencin urgente o en una sala de emergencias.  Si tiene Engineering geologist, por favor llame inmediatamente al 911 o vaya a la sala de emergencias.  Nmeros de bper  - Dr. Nehemiah Massed: [phone]  - Dra. Moye: [phone]  - Dra. Nicole Kindred: [phone]  En caso de inclemencias del [place], por favor llame a Johnsie Kindred principal al [phone] para una actualizacin sobre el [place] de cualquier retraso o cierre.  Consejos para la medicacin en dermatologa: Por favor, guarde las cajas en las que vienen los medicamentos de uso tpico para ayudarle a seguir las instrucciones sobre dnde y cmo usarlos. [place] instrucciones del medicamento  slo en las cajas y no directamente en los tubos del medicamento.   Si su medicamento es muy caro, por favor, pngase en contacto con Zigmund Daniel llamando al [phone] y presione la opcin 4 o envenos un mensaje a travs de Pharmacist, community.   No podemos decirle cul ser su copago por los medicamentos por adelantado ya que esto es diferente dependiendo de la cobertura de su seguro. Sin embargo, es posible que podamos encontrar un medicamento sustituto a Electrical engineer un formulario para que el seguro cubra el medicamento que se considera necesario.   Si se requiere una autorizacin previa para que su compaa de seguros [place] su medicamento, por favor permtanos de 1 a 2 das hbiles para completar este proceso.  Los precios de los  medicamentos varan con frecuencia dependiendo del Environmental consultant de dnde se surte la receta y alguna farmacias pueden ofrecer precios ms baratos.  El sitio web www.goodrx.com tiene cupones para medicamentos de Airline pilot. Los precios aqu no tienen en cuenta lo que podra costar con la ayuda del seguro (puede ser ms barato con su seguro), pero el sitio web puede darle el precio si no utiliz Research scientist (physical sciences).  - Puede imprimir el cupn correspondiente y llevarlo con su receta a la farmacia.  - Tambin puede pasar por nuestra oficina durante el horario de atencin regular y Charity fundraiser una tarjeta de cupones de GoodRx.  - Si necesita que su receta se enve electrnicamente a una farmacia diferente, informe a nuestra oficina a travs de MyChart de [place] o por telfono llamando al [phone] y presione la opcin 4.

## 2022-10-26 DIAGNOSIS — E890 Postprocedural hypothyroidism: Secondary | ICD-10-CM | POA: Diagnosis not present

## 2022-11-02 DIAGNOSIS — Z85850 Personal history of malignant neoplasm of thyroid: Secondary | ICD-10-CM | POA: Diagnosis not present

## 2022-11-02 DIAGNOSIS — E890 Postprocedural hypothyroidism: Secondary | ICD-10-CM | POA: Diagnosis not present

## 2022-11-10 ENCOUNTER — Other Ambulatory Visit: Payer: Self-pay | Admitting: Internal Medicine

## 2022-11-10 ENCOUNTER — Ambulatory Visit: Payer: Disability Insurance | Admitting: Dermatology

## 2022-11-10 DIAGNOSIS — E782 Mixed hyperlipidemia: Secondary | ICD-10-CM

## 2022-11-10 DIAGNOSIS — I6930 Unspecified sequelae of cerebral infarction: Secondary | ICD-10-CM

## 2022-11-12 DIAGNOSIS — H5213 Myopia, bilateral: Secondary | ICD-10-CM | POA: Diagnosis not present

## 2022-11-19 ENCOUNTER — Encounter: Payer: Self-pay | Admitting: Dermatology

## 2022-11-19 ENCOUNTER — Ambulatory Visit: Payer: Medicaid Other | Admitting: Dermatology

## 2022-11-19 VITALS — BP 116/82 | HR 92

## 2022-11-19 DIAGNOSIS — L732 Hidradenitis suppurativa: Secondary | ICD-10-CM | POA: Diagnosis not present

## 2022-11-19 DIAGNOSIS — Z79899 Other long term (current) drug therapy: Secondary | ICD-10-CM | POA: Diagnosis not present

## 2022-11-19 MED ORDER — FINASTERIDE 5 MG PO TABS: ORAL_TABLET | ORAL | 2 refills | Status: DC

## 2022-11-19 MED ORDER — RESORCINOL POWD: 2 refills | Status: DC

## 2022-11-19 NOTE — Patient Instructions (Addendum)
Start Finasteride '5mg'$  1/2 tablet once daily Continue doxycycline monohydrate 100 mg twice daily with food. Continuespironolactone 25 mg once daily Continue clindamycin gel to affected areas twice daily Continue HibiClens wash daily, avoid getting it in the vagina.     Start resorcinol 15% topical compounded cream twice a day to affected areas for flares. Prescription sent to [facility] in [place]. If this is unavailable at [facility], we could try sending the prescription to [facility].     Doxycycline should be taken with food to prevent nausea. Do not lay down for 30 minutes after taking. Be cautious with sun exposure and use good sun protection while on this medication. Pregnant women should not take this medication.    Spironolactone can cause increased urination and cause blood pressure to decrease. Please watch for signs of lightheadedness and be cautious when changing position. It can sometimes cause breast tenderness or an irregular period in premenopausal women. It can also increase potassium. The increase in potassium usually is not a concern unless you are taking other medicines that also increase potassium, so please be sure your doctor knows all of the other medications you are taking. This medication should not be taken by pregnant women.  This medicine should also not be taken together with sulfa drugs like Bactrim (trimethoprim/sulfamethexazole).    Due to recent changes in healthcare laws, you may see results of your pathology and/or laboratory studies on MyChart before the doctors have had a chance to review them. We understand that in some cases there may be results that are confusing or concerning to you. Please understand that not all results are received at the same time and often the doctors may need to interpret multiple results in order to provide you with the best plan of care or course of treatment. Therefore, we ask that you  please give [place] 2 business days to thoroughly review all your results before contacting the office for clarification. Should we see a critical lab result, you will be contacted sooner.   If You Need Anything After Your Visit  If you have any questions or concerns for your doctor, please call our main line at [phone] and press option 4 to reach your doctor's medical assistant. If no one answers, please leave a voicemail as directed and we will return your call as soon as possible. Messages left after 4 pm will be answered the following business day.   You may also send [place] a message via [place]. We typically respond to MyChart messages within 1-2 business days.  For prescription refills, please ask your pharmacy to contact our office. Our fax number is [phone].  If you have an urgent issue when the clinic is closed that cannot wait until the next business day, you can page your doctor at the number below.    Please note that while we do our best to be available for urgent issues outside of office hours, we are not available 24/7.   If you have an urgent issue and are unable to reach [place], you may choose to seek medical care at your doctor's office, retail clinic, urgent care center, or emergency room.  If you have a medical emergency, please immediately call 911 or go to the emergency department.  Pager Numbers  - Dr. Nehemiah Massed: [phone]  - Dr. Laurence Ferrari: [phone]  - Dr. Nicole Kindred: [phone]  In the event of inclement weather, please call our main line at [phone] for an update on the status of any  delays or closures.  Dermatology Medication Tips: Please keep the boxes that topical medications come in in order to help keep track of the instructions about where and how to use these. Pharmacies typically print the medication instructions only on the boxes and not directly on the medication tubes.   If your medication is too expensive, please contact our office at  [phone] option 4 or send [place] a message through [place].   We are unable to tell what your co-pay for medications will be in advance as this is different depending on your insurance coverage. However, we may be able to find a substitute medication at lower cost or fill out paperwork to get insurance to cover a needed medication.   If a prior authorization is required to get your medication covered by your insurance company, please allow [place] 1-2 business days to complete this process.  Drug prices often vary depending on where the prescription is filled and some pharmacies may offer cheaper prices.  The website www.goodrx.com contains coupons for medications through different pharmacies. The prices here do not account for what the cost may be with help from insurance (it may be cheaper with your insurance), but the website can give you the price if you did not use any insurance.  - You can print the associated coupon and take it with your prescription to the pharmacy.  - You may also stop by our office during regular business hours and pick up a GoodRx coupon card.  - If you need your prescription sent electronically to a different pharmacy, notify our office through [facility] or by phone at [phone] option 4.     Si Usted Necesita Algo Despus de Su Visita  Tambin puede enviarnos un mensaje a travs de Pharmacist, community. Por lo general respondemos a los mensajes de MyChart en el transcurso de 1 a 2 das hbiles.  Para renovar recetas, por favor pida a su farmacia que se ponga en contacto con nuestra oficina. Harland Dingwall de fax es [place] [phone].  Si tiene un asunto urgente cuando la clnica est cerrada y que no puede esperar hasta el siguiente da hbil, puede llamar/localizar a su doctor(a) al nmero que aparece a continuacin.   Por favor, tenga en cuenta que aunque hacemos todo lo posible para estar disponibles para asuntos urgentes fuera del horario de [place], no estamos  disponibles las 24 horas del da, los 7 das de la [place].   Si tiene un problema urgente y no puede comunicarse con nosotros, puede optar por buscar atencin mdica  en el consultorio de su doctor(a), en una clnica privada, en un centro de atencin urgente o en una sala de emergencias.  Si tiene Engineering geologist, por favor llame inmediatamente al 911 o vaya a la sala de emergencias.  Nmeros de bper  - Dr. Nehemiah Massed: [phone]  - Dra. Moye: [phone]  - Dra. Nicole Kindred: [phone]  En caso de inclemencias del [place], por favor llame a Johnsie Kindred principal al [phone] para una actualizacin sobre el [place] de cualquier retraso o cierre.  Consejos para la medicacin en dermatologa: Por favor, guarde las cajas en las que vienen los medicamentos de uso tpico para ayudarle a seguir las instrucciones sobre dnde y cmo usarlos. Las farmacias generalmente imprimen las instrucciones del medicamento slo en las cajas y no directamente en los tubos del [place].   Si su medicamento es muy caro, por favor, pngase en contacto con Zigmund Daniel llamando al [phone] y presione la opcin  4 o envenos un mensaje a travs de MyChart.   No podemos decirle cul ser su copago por los medicamentos por adelantado ya que esto es diferente dependiendo de la cobertura de su seguro. Sin embargo, es posible que podamos encontrar un medicamento sustituto a Electrical engineer un formulario para que el seguro cubra el medicamento que se considera necesario.   Si se requiere una autorizacin previa para que su compaa de seguros [place] su medicamento, por favor permtanos de 1 a 2 das hbiles para completar este proceso.  Los precios de los medicamentos varan con frecuencia dependiendo del Environmental consultant de dnde se surte la receta y alguna farmacias pueden ofrecer precios ms baratos.  El sitio web www.goodrx.com tiene cupones para medicamentos de Airline pilot. Los precios aqu no  tienen en cuenta lo que podra costar con la ayuda del seguro (puede ser ms barato con su seguro), pero el sitio web puede darle el precio si no utiliz Research scientist (physical sciences).  - Puede imprimir el cupn correspondiente y llevarlo con su receta a la farmacia.  - Tambin puede pasar por nuestra oficina durante el horario de atencin regular y Charity fundraiser una tarjeta de cupones de GoodRx.  - Si necesita que su receta se enve electrnicamente a una farmacia diferente, informe a nuestra oficina a travs de MyChart de [place] o por telfono llamando al [phone] y presione la opcin 4.

## 2022-11-19 NOTE — Progress Notes (Signed)
   Follow-Up Visit   Subjective  Leah Rogers is a 62 y.o. female who presents for the following: hs followup (Still flared under arms. Taking Doxycycline 100 mg twice daily, spironolactone 25 mg once daily. Using Clindamycin gel and Hibiclens wash as directed).    The following portions of the chart were reviewed this encounter and updated as appropriate:  Tobacco  Allergies  Meds  Problems  Med Hx  Surg Hx  Fam Hx      Review of Systems: No other skin or systemic complaints except as noted in HPI or Assessment and Plan.   Objective  Well appearing patient in no apparent distress; mood and affect are within normal limits.  A focused examination was performed including axillae, inframammary, groin creases . Relevant physical exam findings are noted in the Assessment and Plan.  B/L axillae, inframammary Multiple erythematous papules and nodules, many scars   Assessment & Plan  Hidradenitis suppurativa B/L axillae, inframammary  Chronic and persistent condition with duration or expected duration over one year. Condition is bothersome/symptomatic for patient. Currently flared.   Hidradenitis Suppurativa is a chronic; persistent; non-curable, but treatable condition due to abnormal inflamed sweat glands in the body folds (axilla, inframammary, groin, medial thighs), causing recurrent painful draining cysts and scarring. It can be associated with severe scarring acne and cysts; also abscesses and scarring of scalp. The goal is control and prevention of flares, as it is not curable. Scars are permanent and can be thickened. Treatment may include daily use of topical medication and oral antibiotics.  Oral isotretinoin may also be helpful.  For some cases, Humira or Cosentyx (biologic injections) may be prescribed to decrease the inflammatory process and prevent flares.  When indicated, inflamed cysts may also be treated surgically.  Start Finasteride '5mg'$  1/2 tablet once  daily Continue doxycycline monohydrate 100 mg twice daily with food. Continue spironolactone 25 mg once daily Continue clindamycin gel to affected areas twice daily Continue HibiClens wash daily, avoid getting it in the vagina.    Start resorcinol 15% topical compounded cream twice a day to affected areas for flares. Prescription sent to [facility] in [place]. If this is unavailable at [facility], we could try sending the prescription to [facility].   Discussed cosentyx today. May consider at follow-up if she does not have significant improvement with a little more time on the above regimen.  Reviewed risks of biologics including immunosuppression, infections, injection site reaction, and failure to improve condition. Goal is control of skin condition, not cure.  Talz and Cosentyx may cause inflammatory bowel disease to flare. The use of biologics requires long term medication management, including periodic office visits and monitoring of blood work.   finasteride (PROSCAR) 5 MG tablet - B/L axillae, inframammary Take 1/2 tablet daily  Resorcinol POWD - B/L axillae, inframammary Apply twice daily to affected areas as needed for flares  Related Medications clindamycin (CLINDAGEL) 1 % gel Apply topically 2 (two) times daily.  doxycycline (MONODOX) 100 MG capsule Take 1 capsule (100 mg total) by mouth 2 (two) times daily. Take with food  spironolactone (ALDACTONE) 25 MG tablet Take 1 tablet (25 mg total) by mouth daily.   Return in about 2 months (around 01/18/2023) for HS Recheck.  I, Emelia Salisbury, CMA, am acting as scribe for Forest Gleason, MD.  Documentation: I have reviewed the above documentation for accuracy and completeness, and I agree with the above.  Forest Gleason, MD

## 2022-11-24 ENCOUNTER — Encounter: Payer: Self-pay | Admitting: Dermatology

## 2022-11-30 ENCOUNTER — Other Ambulatory Visit: Payer: Self-pay | Admitting: Dermatology

## 2022-11-30 DIAGNOSIS — L732 Hidradenitis suppurativa: Secondary | ICD-10-CM

## 2022-12-06 ENCOUNTER — Other Ambulatory Visit: Payer: Self-pay | Admitting: Dermatology

## 2022-12-06 DIAGNOSIS — L732 Hidradenitis suppurativa: Secondary | ICD-10-CM

## 2023-01-20 ENCOUNTER — Ambulatory Visit (INDEPENDENT_AMBULATORY_CARE_PROVIDER_SITE_OTHER): Payer: Medicare Other | Admitting: Dermatology

## 2023-01-20 VITALS — BP 127/84 | HR 77

## 2023-01-20 DIAGNOSIS — L732 Hidradenitis suppurativa: Secondary | ICD-10-CM | POA: Diagnosis not present

## 2023-01-20 MED ORDER — DOXYCYCLINE MONOHYDRATE 100 MG PO CAPS: ORAL_CAPSULE | ORAL | 0 refills | Status: DC

## 2023-01-20 MED ORDER — METFORMIN HCL ER (OSM) 500 MG PO TB24: ORAL_TABLET | ORAL | 2 refills | Status: DC

## 2023-01-20 MED ORDER — SPIRONOLACTONE 100 MG PO TABS: 100.0000 mg | ORAL_TABLET | Freq: Every day | ORAL | 2 refills | Status: DC

## 2023-01-20 NOTE — Patient Instructions (Addendum)
Continue Hibiclens wash daily in the shower. Avoid contact with the eyes and vaginal area.   Recommend Lume deodorants or Vanicream deodorant.    Due to recent changes in healthcare laws, you may see results of your pathology and/or laboratory studies on MyChart before the doctors have had a chance to review them. We understand that in some cases there may be results that are confusing or concerning to you. Please understand that not all results are received at the same time and often the doctors may need to interpret multiple results in order to provide you with the best plan of care or course of treatment. Therefore, we ask that you please give [place] 2 business days to thoroughly review all your results before contacting the office for clarification. Should we see a critical lab result, you will be contacted sooner.   If You Need Anything After Your Visit  If you have any questions or concerns for your doctor, please call our main line at [phone] and press option 4 to reach your doctor's medical assistant. If no one answers, please leave a voicemail as directed and we will return your call as soon as possible. Messages left after 4 pm will be answered the following business day.   You may also send [place] a message via MyChart. We typically respond to MyChart messages within 1-2 business days.  For prescription refills, please ask your pharmacy to contact our office. Our fax number is [phone].  If you have an urgent issue when the clinic is closed that cannot wait until the next business day, you can page your doctor at the number below.    Please note that while we do our best to be available for urgent issues outside of office hours, we are not available 24/7.   If you have an urgent issue and are unable to reach [place], you may choose to seek medical care at your doctor's office, retail clinic, urgent care center, or emergency room.  If you have a medical emergency, please immediately call  911 or go to the emergency department.  Pager Numbers  - Dr. Gwen Pounds: [phone]  - Dr. Neale Burly: [phone]  - Dr. Roseanne Reno: [phone]  In the event of inclement weather, please call our main line at [phone] for an update on the status of any delays or closures.  Dermatology Medication Tips: Please keep the boxes that topical medications come in in order to help keep track of the instructions about where and how to use these. Pharmacies typically print the medication instructions only on the boxes and not directly on the medication tubes.   If your medication is too expensive, please contact our office at [phone] option 4 or send [place] a message through MyChart.   We are unable to tell what your co-pay for medications will be in advance as this is different depending on your insurance coverage. However, we may be able to find a substitute medication at lower cost or fill out paperwork to get insurance to cover a needed medication.   If a prior authorization is required to get your medication covered by your insurance company, please allow [place] 1-2 business days to complete this process.  Drug prices often vary depending on where the prescription is filled and some pharmacies may offer cheaper prices.  The website www.goodrx.com contains coupons for medications through different pharmacies. The prices here do not account for what the cost may be with help from insurance (it may be cheaper with your insurance), but  the website can give you the price if you did not use any insurance.  - You can print the associated coupon and take it with your prescription to the pharmacy.  - You may also stop by our office during regular business hours and pick up a GoodRx coupon card.  - If you need your prescription sent electronically to a different pharmacy, notify our office through [facility] or by phone at [phone] option 4.     Si Usted Necesita Algo Despus de Su  Visita  Tambin puede enviarnos un mensaje a travs de Pharmacist, community. Por lo general respondemos a los mensajes de MyChart en el transcurso de 1 a 2 das hbiles.  Para renovar recetas, por favor pida a su farmacia que se ponga en contacto con nuestra oficina. Harland Dingwall de fax es [place] [phone].  Si tiene un asunto urgente cuando la clnica est cerrada y que no puede esperar hasta el siguiente da hbil, puede llamar/localizar a su doctor(a) al nmero que aparece a continuacin.   Por favor, tenga en cuenta que aunque hacemos todo lo posible para estar disponibles para asuntos urgentes fuera del horario de [place], no estamos disponibles las 24 horas del da, los 7 das de la [place].   Si tiene un problema urgente y no puede comunicarse con nosotros, puede optar por buscar atencin mdica  en el consultorio de su doctor(a), en una clnica privada, en un centro de atencin urgente o en una sala de emergencias.  Si tiene Engineering geologist, por favor llame inmediatamente al 911 o vaya a la sala de emergencias.  Nmeros de bper  - Dr. Nehemiah Massed: [phone]  - Dra. Moye: [phone]  - Dra. Nicole Kindred: [phone]  En caso de inclemencias del [place], por favor llame a Johnsie Kindred principal al [phone] para una actualizacin sobre el [place] de cualquier retraso o cierre.  Consejos para la medicacin en dermatologa: Por favor, guarde las cajas en las que vienen los medicamentos de uso tpico para ayudarle a seguir las instrucciones sobre dnde y cmo usarlos. Las farmacias generalmente imprimen las instrucciones del medicamento slo en las cajas y no directamente en los tubos del [place].   Si su medicamento es muy caro, por favor, pngase en contacto con Zigmund Daniel llamando al [phone] y presione la opcin 4 o envenos un mensaje a travs de Pharmacist, community.   No podemos decirle cul ser su copago por los medicamentos por adelantado ya que esto es diferente dependiendo de la  cobertura de su seguro. Sin embargo, es posible que podamos encontrar un medicamento sustituto a Electrical engineer un formulario para que el seguro cubra el medicamento que se considera necesario.   Si se requiere una autorizacin previa para que su compaa de seguros [place] su medicamento, por favor permtanos de 1 a 2 das hbiles para completar este proceso.  Los precios de los medicamentos varan con frecuencia dependiendo del Environmental consultant de dnde se surte la receta y alguna farmacias pueden ofrecer precios ms baratos.  El sitio web www.goodrx.com tiene cupones para medicamentos de Airline pilot. Los precios aqu no tienen en cuenta lo que podra costar con la ayuda del seguro (puede ser ms barato con su seguro), pero el sitio web puede darle el precio si no utiliz Research scientist (physical sciences).  - Puede imprimir el cupn correspondiente y llevarlo con su receta a la farmacia.  - Tambin puede pasar por nuestra oficina durante el horario de atencin regular y Charity fundraiser una tarjeta de cupones de  GoodRx.  - Si necesita que su receta se enve electrnicamente a una farmacia diferente, informe a nuestra oficina a travs de MyChart de [place] o por telfono llamando al [phone] y presione la opcin 4.

## 2023-01-20 NOTE — Progress Notes (Signed)
Follow Up Visit   Subjective  Leah Rogers is a 62 y.o. female who presents for the following: bumps - patient ran out of all medications except the Clindamycin 1% gel. Patient hasn't noticed an improvement in condition. Currently flaring under the R axilla and breast.   The following portions of the chart were reviewed this encounter and updated as appropriate: medications, allergies, medical history  Review of Systems:  No other skin or systemic complaints except as noted in HPI or Assessment and Plan.  Objective  Well appearing patient in no apparent distress; mood and affect are within normal limits.  A focused examination was performed of the following areas:  The face, axilla, and breast  Relevant exam findings are noted in the Assessment and Plan.    Assessment & Plan   Hidradenitis suppurativa  Related Medications finasteride (PROSCAR) 5 MG tablet Take 1/2 tablet daily  Resorcinol POWD Apply twice daily to affected areas as needed for flares  clindamycin (CLINDAGEL) 1 % gel APPLY  THIN LAYER TOPICALLY TWICE DAILY    HIDRADENITIS SUPPURATIVA  Exam: Scars, cyst at R inf breast, R axilla   Chronic and persistent condition with duration or expected duration over one year. Condition is symptomatic/ bothersome to patient. Not currently at goal.  Hidradenitis Suppurativa is a chronic; persistent; non-curable, but treatable condition due to abnormal inflamed sweat glands in the body folds (axilla, inframammary, groin, medial thighs), causing recurrent painful draining cysts and scarring. It can be associated with severe scarring acne and cysts; also abscesses and scarring of scalp. The goal is control and prevention of flares, as it is not curable. Scars are permanent and can be thickened. Treatment may include daily use of topical medication and oral antibiotics.  Oral isotretinoin may also be helpful.  For some cases, Humira or Cosentyx (biologic injections) may be  prescribed to decrease the inflammatory process and prevent flares.  When indicated, inflamed cysts may also be treated surgically.  Treatment Plan:  Discussed adding Metformin ER, start 500 mg po QHS with food. If feeling OK after 2 weeks may increase to BID. May decrease back to QHS if not able to tolerate.   Increase Spironolactone to 100 mg po QHS. Spironolactone can cause increased urination and cause blood pressure to decrease. Please watch for signs of lightheadedness and be cautious when changing position. It can sometimes cause breast tenderness or an irregular period in premenopausal women. It can also increase potassium. The increase in potassium usually is not a concern unless you are taking other medicines that also increase potassium, so please be sure your doctor knows all of the other medications you are taking. This medication should not be taken by pregnant women.  This medicine should also not be taken together with sulfa drugs like Bactrim (trimethoprim/sulfamethexazole).   Recommend Cosentyx if not improving with adding Metformin and increasing spironolactone dose.   Reviewed potassium level.  Patient wasn't comfortable giving her credit card information over the phone for resorcinol 15% compounded mix.   Take doxycycline 100 mg tablet by mouth twice a day for 7 days. #14, 0 refills.  Doxycycline should be taken with food to prevent nausea. Do not lay down for 30 minutes after taking. Be cautious with sun exposure and use good sun protection while on this medication. Pregnant women should not take this medication.   Continue Hibiclens wash daily in the shower. Avoid contact with the eyes and vaginal area.    Return in about 3 months (  around 04/22/2023) for HS follow up .  Maylene Roes, CMA, am acting as scribe for Darden Dates, MD .  Documentation: I have reviewed the above documentation for accuracy and completeness, and I agree with the above.  Darden Dates,  MD

## 2023-02-16 ENCOUNTER — Other Ambulatory Visit: Payer: Self-pay | Admitting: Internal Medicine

## 2023-02-16 DIAGNOSIS — E782 Mixed hyperlipidemia: Secondary | ICD-10-CM

## 2023-02-16 DIAGNOSIS — I6930 Unspecified sequelae of cerebral infarction: Secondary | ICD-10-CM

## 2023-02-18 ENCOUNTER — Ambulatory Visit (INDEPENDENT_AMBULATORY_CARE_PROVIDER_SITE_OTHER): Payer: Medicare Other | Admitting: Internal Medicine

## 2023-02-18 ENCOUNTER — Encounter: Payer: Self-pay | Admitting: Internal Medicine

## 2023-02-18 VITALS — BP 110/78 | HR 79 | Ht 64.0 in | Wt 246.0 lb

## 2023-02-18 DIAGNOSIS — I6523 Occlusion and stenosis of bilateral carotid arteries: Secondary | ICD-10-CM | POA: Diagnosis not present

## 2023-02-18 DIAGNOSIS — L732 Hidradenitis suppurativa: Secondary | ICD-10-CM

## 2023-02-18 DIAGNOSIS — Z1231 Encounter for screening mammogram for malignant neoplasm of breast: Secondary | ICD-10-CM

## 2023-02-18 DIAGNOSIS — N938 Other specified abnormal uterine and vaginal bleeding: Secondary | ICD-10-CM | POA: Diagnosis not present

## 2023-02-18 DIAGNOSIS — R7303 Prediabetes: Secondary | ICD-10-CM

## 2023-02-18 DIAGNOSIS — Z85850 Personal history of malignant neoplasm of thyroid: Secondary | ICD-10-CM

## 2023-02-18 DIAGNOSIS — E782 Mixed hyperlipidemia: Secondary | ICD-10-CM

## 2023-02-18 DIAGNOSIS — D6869 Other thrombophilia: Secondary | ICD-10-CM

## 2023-02-18 DIAGNOSIS — N950 Postmenopausal bleeding: Secondary | ICD-10-CM | POA: Insufficient documentation

## 2023-02-18 NOTE — Assessment & Plan Note (Signed)
Transient bleeding has resolved Will get [place] and refer to GYN if abnormal If [place] is normal/negative, will monitor for recurrence

## 2023-02-18 NOTE — Assessment & Plan Note (Addendum)
Followed by Dermatology; on multiple medications metformin, spironolactone, doxycycline and clindamycin

## 2023-02-18 NOTE — Progress Notes (Signed)
Date:  02/18/2023   Name:  Leah Rogers   DOB:  February 24, 1961   MRN:  [id number]   Chief Complaint: Annual Exam Leah Rogers is a 62 y.o. female who presents today for her Complete Annual Exam. She feels well. She reports exercising - walking. She reports she is sleeping well. Breast complaints - none.  She had a 6 day period last week after no bleeding for 10 years.  There was mild cramping.  Mammogram: 01/2021 - left density - US showed sebaceous cyst DEXA: none Pap smear: 12/2021 neg/neg Colonoscopy: 02/2016 repeat 10 yrs  Health Maintenance Due  Topic Date Due   Medicare Annual Wellness (AWV)  Never done   MAMMOGRAM  01/22/2022   COVID-19 Vaccine (4 - 2023-24 season) 05/22/2022    Immunization History  Administered Date(s) Administered   Influenza,inj,Quad PF,6+ Mos 06/01/2019   PFIZER(Purple Top)SARS-COV-2 Vaccination 12/02/2019, 12/27/2019, 09/09/2020    Hyperlipidemia This is a chronic problem. The problem is controlled. Pertinent negatives include no chest pain or shortness of breath. Current antihyperlipidemic treatment includes statins.  Diabetes She presents for her follow-up diabetic visit. Diabetes type: prediabetes. Pertinent negatives for hypoglycemia include no dizziness, headaches, nervousness/anxiousness or tremors. Pertinent negatives for diabetes include no chest pain, no fatigue, no polydipsia and no polyuria.  Vaginal Bleeding The patient's primary symptoms include vaginal bleeding. The patient's pertinent negatives include no vaginal discharge. This is a new problem. The current episode started 1 to 4 weeks ago. Episode frequency: lasted 6 days. The problem has been resolved. The patient is experiencing no pain. Pertinent negatives include no abdominal pain, chills, constipation, diarrhea, dysuria, fever, frequency, headaches, rash or vomiting. The vaginal bleeding is typical of menses. She is postmenopausal.    Lab Results  Component Value Date   NA 145  (H) 12/30/2021   K 3.7 12/30/2021   CO2 24 12/30/2021   GLUCOSE 92 12/30/2021   BUN 12 12/30/2021   CREATININE 0.62 12/30/2021   CALCIUM 8.8 12/30/2021   EGFR 101 12/30/2021   GFRNONAA >60 03/13/2021   Lab Results  Component Value Date   CHOL 144 12/30/2021   HDL 54 12/30/2021   LDLCALC 76 12/30/2021   TRIG 72 12/30/2021   CHOLHDL 2.7 12/30/2021   Lab Results  Component Value Date   TSH 0.247 (L) 04/22/2021   Lab Results  Component Value Date   HGBA1C 5.8 (H) 02/07/2020   Lab Results  Component Value Date   WBC 6.1 12/30/2021   HGB 13.1 12/30/2021   HCT 38.6 12/30/2021   MCV 92 12/30/2021   PLT 189 12/30/2021   Lab Results  Component Value Date   ALT 25 12/30/2021   AST 19 12/30/2021   ALKPHOS 107 12/30/2021   BILITOT 0.3 12/30/2021   Lab Results  Component Value Date   VD25OH 45.4 01/02/2016     Review of Systems  Constitutional:  Negative for chills, fatigue and fever.  HENT:  Negative for congestion, hearing loss, tinnitus, trouble swallowing and voice change.   Eyes:  Negative for visual disturbance.  Respiratory:  Negative for cough, chest tightness, shortness of breath and wheezing.   Cardiovascular:  Negative for chest pain, palpitations and leg swelling.  Gastrointestinal:  Negative for abdominal pain, constipation, diarrhea and vomiting.  Endocrine: Negative for polydipsia and polyuria.  Genitourinary:  Positive for vaginal bleeding. Negative for dysuria, frequency, genital sores and vaginal discharge.  Musculoskeletal:  Positive for arthralgias. Negative for gait problem and joint swelling.  Skin:  Negative for color change and rash.  Neurological:  Negative for dizziness, tremors, light-headedness and headaches.  Hematological:  Negative for adenopathy. Does not bruise/bleed easily.  Psychiatric/Behavioral:  Negative for dysphoric mood and sleep disturbance. The patient is not nervous/anxious.     Patient Active Problem List   Diagnosis Date  Noted   Prediabetes 02/18/2023   Post-menopausal bleeding 02/18/2023   Acquired thrombophilia (HCC) 12/30/2021   Bilateral carotid artery stenosis 12/26/2020   Mixed hyperlipidemia 06/14/2020   Urinary incontinence in female 02/12/2020   Primary osteoarthritis of both knees 02/12/2020   Dysarthria due to recent stroke 02/12/2020   History of ischemic cerebrovascular accident (CVA) with residual deficit 02/06/2020   Hx of papillary thyroid carcinoma 10/14/2015   Hidradenitis axillaris 09/02/2015    No Known Allergies  Past Surgical History:  Procedure Laterality Date   COLONOSCOPY WITH PROPOFOL N/A 03/20/2016   Procedure: COLONOSCOPY WITH PROPOFOL;  Surgeon: Midge Minium, MD;  Location: [facility];  Service: Endoscopy;  Laterality: N/A;   ENDOMETRIAL BIOPSY  2013   benign- post menopausal bleeding   THYROIDECTOMY N/A 10/14/2015   Procedure: THYROIDECTOMY;  Surgeon: Vernie Murders, MD;  Location: [facility];  Service: ENT;  Laterality: N/A;   TUBAL LIGATION      Social History   Tobacco Use   Smoking status: Former    Packs/day: 0.50    Years: 18.00    Additional pack years: 0.00    Total pack years: 9.00    Types: Cigarettes    Quit date: 03/21/2005    Years since quitting: 17.9   Smokeless tobacco: Never  Vaping Use   Vaping Use: Never used  Substance Use Topics   Alcohol use: No    Alcohol/week: 0.0 standard drinks of alcohol   Drug use: No     Medication list has been reviewed and updated.  Current Meds  Medication Sig   atorvastatin (LIPITOR) 80 MG tablet Take 1 tablet by mouth once daily   calcium gluconate 500 MG tablet Take 500 mg by mouth daily.    cholecalciferol (VITAMIN D3) 25 MCG (1000 UNIT) tablet Take 1,000 Units by mouth daily.   clindamycin (CLINDAGEL) 1 % gel APPLY  THIN LAYER TOPICALLY TWICE DAILY   clopidogrel (PLAVIX) 75 MG tablet Take 1 tablet by mouth once daily   EQ ASPIRIN ADULT LOW DOSE 81 MG EC tablet Take 81 mg by mouth daily.    ferrous sulfate 325 (65 FE) MG tablet Take 325 mg by mouth daily with breakfast.   levothyroxine (SYNTHROID) 125 MCG tablet Take 125 mcg by mouth daily.   metformin (FORTAMET) 500 MG (OSM) 24 hr tablet Take one tab po QHS x 2 weeks. If tolerating medication well may increase to BID. Take with food.   spironolactone (ALDACTONE) 100 MG tablet Take 1 tablet (100 mg total) by mouth at bedtime.   [DISCONTINUED] cyclobenzaprine (FLEXERIL) 10 MG tablet Take 1 tablet (10 mg total) by mouth 3 (three) times daily as needed for muscle spasms.   [DISCONTINUED] doxycycline (MONODOX) 100 MG capsule Take with food and drink twice daily for one week.   [DISCONTINUED] vitamin E 45 MG (100 UNITS) capsule Take by mouth daily.       02/18/2023    9:25 AM 12/30/2021    9:26 AM 10/20/2021   11:19 AM 12/26/2020    9:21 AM  GAD 7 : Generalized Anxiety Score  Nervous, Anxious, on Edge 1 0 0 0  Control/stop worrying 1 0  0 0  Worry too much - different things 1 0 0 0  Trouble relaxing 1 0 1 0  Restless 1 0 0 0  Easily annoyed or irritable 1 0 0 0  Afraid - awful might happen 1 1 0 0  Total GAD 7 Score 7 1 1  0  Anxiety Difficulty Not difficult at all   Not difficult at all       02/18/2023    9:25 AM 12/30/2021    9:25 AM 10/20/2021   11:18 AM  Depression screen PHQ 2/9  Decreased Interest 1 2 0  Down, Depressed, Hopeless 1 0 0  PHQ - 2 Score 2 2 0  Altered sleeping 1 2 1   Tired, decreased energy 1 2 2   Change in appetite 1 0 0  Feeling bad or failure about yourself  1 0 0  Trouble concentrating 1 0 0  Moving slowly or fidgety/restless 1 0 0  Suicidal thoughts 0 0 0  PHQ-9 Score 8 6 3   Difficult doing work/chores Somewhat difficult Somewhat difficult Not difficult at all    BP Readings from Last 3 Encounters:  02/18/23 110/78  01/20/23 127/84  11/19/22 116/82    Physical Exam Vitals and nursing note reviewed.  Constitutional:      General: She is not in acute distress.    Appearance: She is  well-developed.  HENT:     Head: Normocephalic and atraumatic.     Right Ear: Tympanic membrane and ear canal normal.     Left Ear: Tympanic membrane and ear canal normal.     Nose:     Right Sinus: No maxillary sinus tenderness.     Left Sinus: No maxillary sinus tenderness.  Eyes:     General: No scleral icterus.       Right eye: No discharge.        Left eye: No discharge.     Conjunctiva/sclera: Conjunctivae normal.  Neck:     Thyroid: No thyromegaly.     Vascular: No carotid bruit.  Cardiovascular:     Rate and Rhythm: Normal rate and regular rhythm.     Pulses: Normal pulses.     Heart sounds: Normal heart sounds.  Pulmonary:     Effort: Pulmonary effort is normal. No respiratory distress.     Breath sounds: No wheezing.  Chest:  Breasts:    Right: No mass, nipple discharge, skin change or tenderness.     Left: No mass, nipple discharge, skin change or tenderness.  Abdominal:     General: Bowel sounds are normal.     Palpations: Abdomen is soft.     Tenderness: There is no abdominal tenderness. There is no right CVA tenderness or left CVA tenderness.  Musculoskeletal:     Cervical back: Normal range of motion. No erythema.     Right lower leg: No edema.     Left lower leg: No edema.  Lymphadenopathy:     Cervical: No cervical adenopathy.  Skin:    General: Skin is warm and dry.     Findings: No rash.     Comments: HS tracts and inflammation in both axilla - no drainage noted  Neurological:     Mental Status: She is alert and oriented to person, place, and time.     Cranial Nerves: No cranial nerve deficit.     Sensory: No sensory deficit.     Deep Tendon Reflexes: Reflexes are normal and symmetric.  Psychiatric:  Attention and Perception: Attention normal.        Mood and Affect: Mood normal.     Wt Readings from Last 3 Encounters:  02/18/23 246 lb (111.6 kg)  10/12/22 252 lb (114.3 kg)  12/30/21 253 lb (114.8 kg)    BP 110/78   Pulse 79   Ht 5'  4" (1.626 m)   Wt 246 lb (111.6 kg)   SpO2 96%   BMI 42.23 kg/m   Assessment and Plan:  Problem List Items Addressed This Visit     Prediabetes    Managed with diet and metformin Lab Results  Component Value Date   HGBA1C 5.8 (H) 02/07/2020        Relevant Orders   Comprehensive metabolic panel   Hemoglobin A1c   Post-menopausal bleeding - Primary    Transient bleeding has resolved Will get [place] and refer to GYN if abnormal If [place] is normal/negative, will monitor for recurrence      Mixed hyperlipidemia    Lab Results  Component Value Date   LDLCALC 76 12/30/2021  On atorvastatin      Relevant Orders   Lipid panel   Hx of papillary thyroid carcinoma    On thyroid supplements. Lab Results  Component Value Date   TSH 0.247 (L) 04/22/2021        Relevant Orders   TSH + free T4   Hidradenitis axillaris    Followed by Dermatology; on multiple medications metformin, spironolactone, doxycycline and clindamycin      Relevant Orders   CBC with Differential/Platelet   Bilateral carotid artery stenosis    On appropriate statin therapy      Relevant Orders   Lipid panel   Acquired thrombophilia (HCC)    Doing well on Plavix and aspirin for secondary stroke prevention Recent transient vaginal bleeding has resolved so will not discontinue      Other Visit Diagnoses     Encounter for screening mammogram for breast cancer       Relevant Orders   MM 3D SCREENING MAMMOGRAM BILATERAL BREAST       No follow-ups on file.   Partially dictated using Dragon software, any errors are not intentional.  Reubin Milan, MD [facility] [place], [place]

## 2023-02-18 NOTE — Assessment & Plan Note (Signed)
Doing well on Plavix and aspirin for secondary stroke prevention Recent transient vaginal bleeding has resolved so will not discontinue

## 2023-02-18 NOTE — Assessment & Plan Note (Signed)
On thyroid supplements. Lab Results  Component Value Date   TSH 0.247 (L) 04/22/2021

## 2023-02-18 NOTE — Assessment & Plan Note (Signed)
Managed with diet and metformin Lab Results  Component Value Date   HGBA1C 5.8 (H) 02/07/2020

## 2023-02-18 NOTE — Assessment & Plan Note (Signed)
Lab Results  Component Value Date   LDLCALC 76 12/30/2021  On atorvastatin

## 2023-02-18 NOTE — Assessment & Plan Note (Signed)
On appropriate statin therapy 

## 2023-02-18 NOTE — Patient Instructions (Addendum)
Call [facility] to schedule your mammogram at [phone].  Someone will be calling soon to schedule the pelvic ultrasound.

## 2023-02-19 ENCOUNTER — Ambulatory Visit
Admission: RE | Admit: 2023-02-19 | Discharge: 2023-02-19 | Disposition: A | Discharge status: Home / Self Care | Payer: Medicare Other | Source: Ambulatory Visit | Attending: Internal Medicine | Admitting: Internal Medicine

## 2023-02-19 DIAGNOSIS — N938 Other specified abnormal uterine and vaginal bleeding: Secondary | ICD-10-CM | POA: Diagnosis present

## 2023-02-19 LAB — TSH+FREE T4
Free T4: 1.71 ng/dL (ref 0.82–1.77)
TSH: 0.273 u[IU]/mL — ABNORMAL LOW (ref 0.450–4.500)

## 2023-02-19 LAB — CBC WITH DIFFERENTIAL/PLATELET
Basophils Absolute: 0 10*3/uL (ref 0.0–0.2)
Basos: 0 %
EOS (ABSOLUTE): 0.1 10*3/uL (ref 0.0–0.4)
Eos: 1 %
Hematocrit: 37 % (ref 34.0–46.6)
Hemoglobin: 12.5 g/dL (ref 11.1–15.9)
Immature Grans (Abs): 0 10*3/uL (ref 0.0–0.1)
Immature Granulocytes: 0 %
Lymphocytes Absolute: 1.6 10*3/uL (ref 0.7–3.1)
Lymphs: 23 %
MCH: 31.3 pg (ref 26.6–33.0)
MCHC: 33.8 g/dL (ref 31.5–35.7)
MCV: 93 fL (ref 79–97)
Monocytes Absolute: 0.9 10*3/uL (ref 0.1–0.9)
Monocytes: 13 %
Neutrophils Absolute: 4.5 10*3/uL (ref 1.4–7.0)
Neutrophils: 63 %
Platelets: 203 10*3/uL (ref 150–450)
RBC: 3.99 x10E6/uL (ref 3.77–5.28)
RDW: 12.5 % (ref 11.7–15.4)
WBC: 7.1 10*3/uL (ref 3.4–10.8)

## 2023-02-19 LAB — LIPID PANEL
Chol/HDL Ratio: 2.7 ratio (ref 0.0–4.4)
Cholesterol, Total: 120 mg/dL (ref 100–199)
HDL: 44 mg/dL (ref >39)
LDL Chol Calc (NIH): 62 mg/dL (ref 0–99)
Triglycerides: 64 mg/dL (ref 0–149)
VLDL Cholesterol Cal: 14 mg/dL (ref 5–40)

## 2023-02-19 LAB — COMPREHENSIVE METABOLIC PANEL
ALT: 17 IU/L (ref 0–32)
AST: 17 IU/L (ref 0–40)
Albumin/Globulin Ratio: 1.1 — ABNORMAL LOW (ref 1.2–2.2)
Albumin: 4.1 g/dL (ref 3.9–4.9)
Alkaline Phosphatase: 92 IU/L (ref 44–121)
BUN/Creatinine Ratio: 29 — ABNORMAL HIGH (ref 12–28)
BUN: 26 mg/dL (ref 8–27)
Bilirubin Total: <0.2 mg/dL (ref 0.0–1.2)
CO2: 22 mmol/L (ref 20–29)
Calcium: 9.5 mg/dL (ref 8.7–10.3)
Chloride: 103 mmol/L (ref 96–106)
Creatinine, Ser: 0.91 mg/dL (ref 0.57–1.00)
Globulin, Total: 3.6 g/dL (ref 1.5–4.5)
Glucose: 91 mg/dL (ref 70–99)
Potassium: 4.1 mmol/L (ref 3.5–5.2)
Sodium: 141 mmol/L (ref 134–144)
Total Protein: 7.7 g/dL (ref 6.0–8.5)
eGFR: 71 mL/min/{1.73_m2} (ref >59)

## 2023-02-19 LAB — HEMOGLOBIN A1C
Est. average glucose Bld gHb Est-mCnc: 126 mg/dL
Hgb A1c MFr Bld: 6 % — ABNORMAL HIGH (ref 4.8–5.6)

## 2023-02-22 ENCOUNTER — Other Ambulatory Visit: Payer: Self-pay

## 2023-02-22 DIAGNOSIS — N938 Other specified abnormal uterine and vaginal bleeding: Secondary | ICD-10-CM

## 2023-02-22 DIAGNOSIS — R935 Abnormal findings on diagnostic imaging of other abdominal regions, including retroperitoneum: Secondary | ICD-10-CM

## 2023-03-01 ENCOUNTER — Telehealth: Payer: Self-pay | Admitting: Internal Medicine

## 2023-03-01 NOTE — Telephone Encounter (Signed)
Copied from CRM [id number]. Topic: General - Other >> Mar 01, 2023  9:44 AM Epimenio Foot F wrote: Reason for CRM: Pt is calling in because she wants to know if the Gynecologist she was referred to will call her or should she reach out to them. Pt is also requesting the Gynecologist name and location. Please advise.

## 2023-03-01 NOTE — Telephone Encounter (Signed)
Called pt gave her the number to [facility]. [phone]  KP

## 2023-03-10 ENCOUNTER — Ambulatory Visit: Payer: Medicare Other

## 2023-03-17 ENCOUNTER — Ambulatory Visit
Admission: RE | Admit: 2023-03-17 | Discharge: 2023-03-17 | Disposition: A | Discharge status: Home / Self Care | Payer: Medicare Other | Source: Ambulatory Visit | Attending: Internal Medicine | Admitting: Internal Medicine

## 2023-03-17 DIAGNOSIS — Z1231 Encounter for screening mammogram for malignant neoplasm of breast: Secondary | ICD-10-CM | POA: Insufficient documentation

## 2023-04-24 ENCOUNTER — Other Ambulatory Visit: Payer: Self-pay | Admitting: Dermatology

## 2023-05-03 DIAGNOSIS — Z85850 Personal history of malignant neoplasm of thyroid: Secondary | ICD-10-CM | POA: Diagnosis not present

## 2023-05-03 DIAGNOSIS — E890 Postprocedural hypothyroidism: Secondary | ICD-10-CM | POA: Diagnosis not present

## 2023-05-06 ENCOUNTER — Ambulatory Visit: Payer: Disability Insurance | Admitting: Dermatology

## 2023-05-06 DIAGNOSIS — R9389 Abnormal findings on diagnostic imaging of other specified body structures: Secondary | ICD-10-CM | POA: Diagnosis not present

## 2023-05-08 ENCOUNTER — Other Ambulatory Visit: Payer: Self-pay | Admitting: Dermatology

## 2023-05-11 MED ORDER — METFORMIN HCL ER 500 MG PO TB24: ORAL_TABLET | ORAL | 0 refills | Status: DC

## 2023-05-13 ENCOUNTER — Ambulatory Visit (INDEPENDENT_AMBULATORY_CARE_PROVIDER_SITE_OTHER): Payer: Medicare Other | Admitting: Dermatology

## 2023-05-13 ENCOUNTER — Encounter: Payer: Self-pay | Admitting: Dermatology

## 2023-05-13 VITALS — BP 110/77

## 2023-05-13 DIAGNOSIS — Z79899 Other long term (current) drug therapy: Secondary | ICD-10-CM | POA: Diagnosis not present

## 2023-05-13 DIAGNOSIS — L732 Hidradenitis suppurativa: Secondary | ICD-10-CM

## 2023-05-13 MED ORDER — SPIRONOLACTONE 100 MG PO TABS: 100.0000 mg | ORAL_TABLET | Freq: Every day | ORAL | 11 refills | Status: DC

## 2023-05-13 MED ORDER — METFORMIN HCL ER 500 MG PO TB24: ORAL_TABLET | ORAL | 11 refills | Status: DC

## 2023-05-13 NOTE — Patient Instructions (Signed)

## 2023-05-13 NOTE — Progress Notes (Signed)
   Follow-Up Visit   Subjective  Leah Rogers is a 62 y.o. female who presents for the following: HS f/u inframammary, axilla, Meformin ER 500mg  po BID, Spironolactone 100mg  qhs, no s/e from medicatons, improved not as many flares,  The patient has spots, moles and lesions to be evaluated, some may be new or changing and the patient may have concern these could be cancer.   The following portions of the chart were reviewed this encounter and updated as appropriate: medications, allergies, medical history  Review of Systems:  No other skin or systemic complaints except as noted in HPI or Assessment and Plan.  Objective  Well appearing patient in no apparent distress; mood and affect are within normal limits.   A focused examination was performed of the following areas: Trunk, axilla  Relevant exam findings are noted in the Assessment and Plan.    Assessment & Plan   HIDRADENITIS SUPPURATIVA R inframammary, bil axilla Exam: R axilla with 2 inflamed pap and draining sinus tract with some scarring, hypertrophic scarring L axilla, left inframammary with hyperpigmented inflamed nodule  Chronic and persistent condition with duration or expected duration over one year. Condition is improving with treatment but still flaring and not currently at goal.   Hidradenitis Suppurativa is a chronic; persistent; non-curable, but treatable condition due to abnormal inflamed sweat glands in the body folds (axilla, inframammary, groin, medial thighs), causing recurrent painful draining cysts and scarring. It can be associated with severe scarring acne and cysts; also abscesses and scarring of scalp. The goal is control and prevention of flares, as it is not curable. Scars are permanent and can be thickened. Treatment may include daily use of topical medication and oral antibiotics.  Oral isotretinoin may also be helpful.  For some cases, Humira or Cosentyx (biologic injections) may be prescribed to  decrease the inflammatory process and prevent flares.  When indicated, inflamed cysts may also be treated surgically.  Treatment Plan: Discussed increasing Spironolactone, discussed longer course of Doxycyline, and discussed biologics, pt declines changes at this time  Cont Spironolactone 100mg  1 po qhs  Cont Metformin XR 500mg  1 po bid  Advised pt if flares she can call/ send mychart message for short course of Doxycycline  Spironolactone can cause increased urination and cause blood pressure to decrease. Please watch for signs of lightheadedness and be cautious when changing position. It can sometimes cause breast tenderness or an irregular period in premenopausal women. It can also increase potassium. The increase in potassium usually is not a concern unless you are taking other medicines that also increase potassium, so please be sure your doctor knows all of the other medications you are taking. This medication should not be taken by pregnant women.  This medicine should also not be taken together with sulfa drugs like Bactrim (trimethoprim/sulfamethexazole).     Return in about 1 year (around 05/12/2024) for HS f/u.  I, Ardis Rowan, RMA, am acting as scribe for Elie Goody, MD .   Documentation: I have reviewed the above documentation for accuracy and completeness, and I agree with the above.  Elie Goody, MD

## 2023-05-28 ENCOUNTER — Other Ambulatory Visit: Payer: Self-pay | Admitting: Obstetrics and Gynecology

## 2023-06-07 DIAGNOSIS — Z01818 Encounter for other preprocedural examination: Secondary | ICD-10-CM | POA: Diagnosis not present

## 2023-06-07 NOTE — H&P (Signed)
Leah Rogers is a 62 y.o. female here for Pre Op Consulting . History of Present Illness: Patient presents for a preoperative visit to schedule a D&C, hysteroscopy with possible Myosure polypectomy for PMB.      SIS 05/06/23 - Uterus: 9.89 x 6.05 x 6.06 cm  - Ovaries: LO wnl, RO with complex 2.1 cm cyst with 0.37 cm septation and 0.46 cm calcified nodule  - Other: Lt lateral necrotic fibroid - Endometrium 7.06 mm    Possible polyp noted           Past Medical History:  has a past medical history of Hidradenitis axillaris (09/02/2015), History of stroke, Osteoarthritis of knee (09/02/2015), Papillary thyroid carcinoma (CMS/HHS-HCC) (10/01/2015), S/P total thyroidectomy (10/14/2015), and Special screening for malignant neoplasms, colon (04/01/2017).  Past Surgical History:  has a past surgical history that includes Thyroidectomy Total and Replacement total knee (Left). Family History: family history includes Lung cancer in her mother; Myocardial Infarction (Heart attack) in her brother. Social History:  reports that she has quit smoking. She has never used smokeless tobacco. She reports that she does not drink alcohol and does not use drugs. OB/GYN History:  OB History       Gravida 1   Para     Term     Preterm     AB     Living 1       SAB     IAB     Ectopic     Molar     Multiple     Live Births            Allergies: has No Known Allergies. Medications:  Current Medications   Current Outpatient Medications:    amoxicillin (AMOXIL) 500 MG capsule, Take 4 capsules by mouth, Disp: , Rfl:    aspirin 81 MG EC tablet, Take 1 tablet (81 mg total) by mouth once daily, Disp: 30 tablet, Rfl: 11   atorvastatin (LIPITOR) 80 MG tablet, Take 80 mg by mouth once daily, Disp: , Rfl:    calcium gluconate 60 mg calcium (650 mg) Tab, Take 1 tablet by mouth once daily., Disp: , Rfl:    cholecalciferol 1000 unit tablet, , Disp: , Rfl:    cholecalciferol,  vitamin D3, 2,000 unit Chew, Take 1 tablet by mouth once daily., Disp: , Rfl:    clindamycin (CLEOCIN T) 1 % topical gel, APPLY  THIN LAYER TOPICALLY TWICE DAILY, Disp: , Rfl:    clopidogreL (PLAVIX) 75 mg tablet, Take 1 tablet by mouth once daily, Disp: 30 tablet, Rfl: 0   ferrous sulfate 325 (65 FE) MG tablet, Take by mouth, Disp: , Rfl:    levothyroxine (SYNTHROID) 112 MCG tablet, Take 112 mcg by mouth once daily, Disp: , Rfl:    levothyroxine (SYNTHROID, LEVOTHROID) 125 MCG tablet, Take 1 tablet by mouth once daily., Disp: , Rfl:    metFORMIN (FORTAMET) 500 MG (OSM) 24 hr tablet, Take one tab po QHS x 2 weeks. If tolerating medication well may increase to BID. Take with food., Disp: , Rfl:    metFORMIN (GLUCOPHAGE-XR) 500 MG XR tablet, Take 500 mg by mouth 2 (two) times daily, Disp: , Rfl:    spironolactone (ALDACTONE) 100 MG tablet, Take 1 tablet by mouth at bedtime, Disp: , Rfl:     Review of Systems: No SOB, no palpitations or chest pain, no new lower extremity edema, no nausea or vomiting or bowel or bladder complaints. See HPI for gyn specific ROS.  Exam:   BP 122/81   Pulse 73   Ht 165.1 cm (5\' 5" )   Wt (!) 106.3 kg (234 lb 6.4 oz)   BMI 39.01 kg/m      Constitutional:  General appearance: Well nourished, well developed female in no acute distress.  Neuro/psych:  Normal mood and affect. No gross motor deficits. Neck:  Supple, normal appearance.  Respiratory:  Normal respiratory effort, no use of accessory muscles Skin:  No visible rashes or external lesions      Impression:   The primary encounter diagnosis was Preop examination. Diagnoses of Post-menopausal bleeding and Right ovarian cyst were also pertinent to this visit.   Plan:   1.  Preoperative visit: D&C hysteroscopy. Consents signed today.  -Risks of surgery were discussed with the patient including but not limited to: bleeding which may require transfusion; infection which may require antibiotics; injury to  uterus or surrounding organs; intrauterine scarring which may impair future fertility; need for additional procedures including laparotomy or laparoscopy; and other postoperative/anesthesia complications. Written informed consent was obtained.   This is a scheduled same-day surgery. She will have a postop visit in 2 weeks to review operative findings and pathology.   2. Complex RO cyst, with calcified density present since 2013 - Plan for repeat ultrasound in 6 months  - Will get CA125 and HE4 labs based on her ultrasound results  - With cyst present x 10+ years reassured not malignant

## 2023-06-09 ENCOUNTER — Encounter
Admission: RE | Admit: 2023-06-09 | Discharge: 2023-06-09 | Disposition: A | Discharge status: Home / Self Care | Payer: Medicare Other | Source: Ambulatory Visit | Attending: Obstetrics and Gynecology | Admitting: Obstetrics and Gynecology

## 2023-06-09 VITALS — Ht 64.0 in | Wt 234.3 lb

## 2023-06-09 DIAGNOSIS — Z01810 Encounter for preprocedural cardiovascular examination: Secondary | ICD-10-CM

## 2023-06-09 DIAGNOSIS — Z01812 Encounter for preprocedural laboratory examination: Secondary | ICD-10-CM

## 2023-06-09 DIAGNOSIS — R7303 Prediabetes: Secondary | ICD-10-CM

## 2023-06-09 DIAGNOSIS — I6523 Occlusion and stenosis of bilateral carotid arteries: Secondary | ICD-10-CM

## 2023-06-09 DIAGNOSIS — Z79899 Other long term (current) drug therapy: Secondary | ICD-10-CM

## 2023-06-09 DIAGNOSIS — I6930 Unspecified sequelae of cerebral infarction: Secondary | ICD-10-CM

## 2023-06-09 HISTORY — DX: Occlusion and stenosis of bilateral carotid arteries: I65.23

## 2023-06-09 HISTORY — DX: Prediabetes: R73.03

## 2023-06-09 HISTORY — DX: Unspecified ovarian cyst, unspecified side: N83.209

## 2023-06-09 HISTORY — DX: Other thrombophilia: D68.69

## 2023-06-09 HISTORY — DX: Cerebral infarction, unspecified: I63.9

## 2023-06-09 HISTORY — DX: Hypothyroidism, unspecified: E03.9

## 2023-06-09 HISTORY — DX: Hidradenitis suppurativa: L73.2

## 2023-06-09 HISTORY — DX: Anemia, unspecified: D64.9

## 2023-06-09 HISTORY — DX: Postmenopausal bleeding: N95.0

## 2023-06-09 NOTE — Pre-Procedure Instructions (Signed)
I called pt to do her anesthesia interview for upcoming surgery with [facility]. Pt states no one has instructed her on what to do with asa and plavix. I did secure chat with Dalbert Garnet while I was on the phone with pt and dr Dalbert Garnet never looked at chat. I told pt to call Dr Harlon Flor office once we hang up to call and ask office if /when she needs to stop the medications. Pt verbalized she would

## 2023-06-09 NOTE — Patient Instructions (Signed)
Your procedure is scheduled on:06-17-23 Thursday Report to the Registration Desk on the 1st floor of the [facility].Then proceed to the 2nd floor Surgery Desk To find out your arrival time, please call [phone] between 1PM - 3PM on:06-16-23 Wednesday If your arrival time is 6:00 am, do not arrive before that time as the [facility] entrance doors do not open until 6:00 am.  REMEMBER: Instructions that are not followed completely may result in serious medical risk, up to and including death; or upon the discretion of your surgeon and anesthesiologist your surgery may need to be rescheduled.  Do not eat food after midnight the night before surgery.  No gum chewing or hard candies.  You may however, drink CLEAR liquids up to 2 hours before you are scheduled to arrive for your surgery. Do not drink anything within 2 hours of your scheduled arrival time.  Clear liquids include: - water  - apple juice without pulp - gatorade (not RED colors) - black coffee or tea (Do NOT add milk or creamers to the coffee or tea) Do NOT drink anything that is not on this list.  One week prior to surgery: Stop Anti-inflammatories (NSAIDS) such as Advil, Aleve, Ibuprofen, Motrin, Naproxen, Naprosyn and Aspirin based products such as Excedrin, Goody's Powder, BC Powder.You may however, take Tylenol if needed for pain up until the day of surgery. Stop ANY OVER THE COUNTER supplements/vitamins NOW (06-09-23)until after surgery (Calcium, Vitamin D3 and Ferrous Sulfate)   Continue taking all prescribed medications with the exception of: -metFORMIN (GLUCOPHAGE-XR)-Stop 2 days prior to surgery-Last dose will be on 06-14-23 Monday   TAKE ONLY THESE MEDICATIONS THE MORNING OF SURGERY WITH A SIP OF WATER: -atorvastatin (LIPITOR)  -levothyroxine (SYNTHROID)   Call Dr Francena Hanly office today (06-09-23) regarding if/when you need to stop your 81 mg Aspirin and clopidogrel (PLAVIX)   No Alcohol for 24 hours before or  after surgery.  No Smoking including e-cigarettes for 24 hours before surgery.  No chewable tobacco products for at least 6 hours before surgery.  No nicotine patches on the day of surgery.  Do not use any "recreational" drugs for at least a week (preferably 2 weeks) before your surgery.  Please be advised that the combination of cocaine and anesthesia may have negative outcomes, up to and including death. If you test positive for cocaine, your surgery will be cancelled.  On the morning of surgery brush your teeth with toothpaste and water, you may rinse your mouth with mouthwash if you wish. Do not swallow any toothpaste or mouthwash.  Do not wear jewelry, make-up, hairpins, clips or nail polish.  For welded (permanent) jewelry: bracelets, anklets, waist bands, etc.  Please have this removed prior to surgery.  If it is not removed, there is a chance that hospital personnel will need to cut it off on the day of surgery.  Do not wear lotions, powders, or perfumes.   Do not shave body hair from the neck down 48 hours before surgery.  Contact lenses, hearing aids and dentures may not be worn into surgery.  Do not bring valuables to the hospital. [facility] is not responsible for any missing/lost belongings or valuables.  Notify your doctor if there is any change in your medical condition (cold, fever, infection).  Wear comfortable clothing (specific to your surgery type) to the hospital.  After surgery, you can help prevent lung complications by doing breathing exercises.  Take deep breaths and cough every 1-2 hours. Your doctor  may order a device called an Incentive Spirometer to help you take deep breaths. When coughing or sneezing, hold a pillow firmly against your incision with both hands. This is called "splinting." Doing this helps protect your incision. It also decreases belly discomfort.  If you are being admitted to the hospital overnight, leave your suitcase in the car. After  surgery it may be brought to your room.  In case of increased patient census, it may be necessary for you, the patient, to continue your postoperative care in the Same Day Surgery department.  If you are being discharged the day of surgery, you will not be allowed to drive home. You will need a responsible individual to drive you home and stay with you for 24 hours after surgery.   If you are taking public transportation, you will need to have a responsible individual with you.  Please call the Pre-admissions Testing Dept. at [phone] if you have any questions about these instructions.  Surgery Visitation Policy:  Patients having surgery or a procedure may have two visitors.  Children under the age of 42 must have an adult with them who is not the patient.

## 2023-06-11 ENCOUNTER — Encounter
Admission: RE | Admit: 2023-06-11 | Discharge: 2023-06-11 | Disposition: A | Discharge status: Home / Self Care | Payer: Medicare Other | Source: Ambulatory Visit | Attending: Obstetrics and Gynecology | Admitting: Obstetrics and Gynecology

## 2023-06-11 DIAGNOSIS — R7303 Prediabetes: Secondary | ICD-10-CM | POA: Diagnosis not present

## 2023-06-11 DIAGNOSIS — I6523 Occlusion and stenosis of bilateral carotid arteries: Secondary | ICD-10-CM | POA: Diagnosis not present

## 2023-06-11 DIAGNOSIS — Z01818 Encounter for other preprocedural examination: Secondary | ICD-10-CM | POA: Diagnosis not present

## 2023-06-11 DIAGNOSIS — I6930 Unspecified sequelae of cerebral infarction: Secondary | ICD-10-CM | POA: Diagnosis not present

## 2023-06-11 DIAGNOSIS — N950 Postmenopausal bleeding: Secondary | ICD-10-CM | POA: Diagnosis not present

## 2023-06-11 DIAGNOSIS — Z79899 Other long term (current) drug therapy: Secondary | ICD-10-CM | POA: Insufficient documentation

## 2023-06-11 DIAGNOSIS — R9431 Abnormal electrocardiogram [ECG] [EKG]: Secondary | ICD-10-CM | POA: Diagnosis not present

## 2023-06-11 DIAGNOSIS — Z01810 Encounter for preprocedural cardiovascular examination: Secondary | ICD-10-CM

## 2023-06-11 LAB — BASIC METABOLIC PANEL
Anion gap: 13 (ref 5–15)
BUN: 24 mg/dL — ABNORMAL HIGH (ref 8–23)
CO2: 24 mmol/L (ref 22–32)
Calcium: 8.3 mg/dL — ABNORMAL LOW (ref 8.9–10.3)
Chloride: 102 mmol/L (ref 98–111)
Creatinine, Ser: 0.95 mg/dL (ref 0.44–1.00)
GFR, Estimated: >60 mL/min (ref >60)
Glucose, Bld: 89 mg/dL (ref 70–99)
Potassium: 4 mmol/L (ref 3.5–5.1)
Sodium: 139 mmol/L (ref 135–145)

## 2023-06-11 LAB — CBC
HCT: 37 % (ref 36.0–46.0)
Hemoglobin: 12.4 g/dL (ref 12.0–15.0)
MCH: 31.5 pg (ref 26.0–34.0)
MCHC: 33.5 g/dL (ref 30.0–36.0)
MCV: 93.9 fL (ref 80.0–100.0)
Platelets: 209 10*3/uL (ref 150–400)
RBC: 3.94 MIL/uL (ref 3.87–5.11)
RDW: 14.5 % (ref 11.5–15.5)
WBC: 7.3 10*3/uL (ref 4.0–10.5)
nRBC: 0 % (ref 0.0–0.2)

## 2023-06-14 NOTE — Pre-Procedure Instructions (Signed)
Pt came in for labs on Friday (06-11-23) and I asked her if she had heard anything from Dr Francena Hanly office regarding when to stop her Aspirin and Plavix. Pt states someone had just called her and informed her to stop her 81 mg Aspirin and continue her Plavix. I asked pt if she was sure about this bc normally Jonne Ply is continued and Plavix is stopped. Pt states that she is sure that is what she was instructed to do. I sent secure chat to Angie at Dr Francena Hanly office to make sure this is correct since I do not see a phone note in Care Everywhere regarding what instructions were given to pt. Waiting on response from Angie at Dr Harlon Flor office

## 2023-06-14 NOTE — Pre-Procedure Instructions (Addendum)
Dr Dalbert Garnet finally did respond in Secure chat but not until the next day regarding the Aspirin and Plavix . Pt had already called Dr. Francena Hanly office yesterday (06-09-23) to find out what to do regarding her aspirin and plavix since no one had instructed her what to do. Dr Dalbert Garnet said in secure chat that I received the next day that pt needs to stop both her asa and plavix (pt on this for her cva in 2021). Will wait and find out when pt comes in for labs on 06-11-23 (Friday) and see what pt was told from Dr Harlon Flor office.

## 2023-06-14 NOTE — Pre-Procedure Instructions (Signed)
Called Angie at Dr Francena Hanly office regarding Aspirin and Plavix instructions that someone from their office relayed to pt on Friday (06-11-23). Angie states she will check with Dr Dalbert Garnet and that once they know exactly what is to be held, their office will call the patient if the information that the patient stated to me is wrong

## 2023-06-16 MED ORDER — ORAL CARE MOUTH RINSE: 15.0000 mL | Freq: Once | OROMUCOSAL | Status: AC

## 2023-06-16 MED ORDER — LACTATED RINGERS IV SOLN: INTRAVENOUS | Status: DC

## 2023-06-16 MED ORDER — CHLORHEXIDINE GLUCONATE 0.12 % MT SOLN
15.0000 mL | Freq: Once | OROMUCOSAL | Status: AC
  Administered 2023-06-17: 15 mL via OROMUCOSAL

## 2023-06-16 MED ORDER — POVIDONE-IODINE 10 % EX SWAB
2.0000 | Freq: Once | CUTANEOUS | Status: AC
  Administered 2023-06-17: 2 via TOPICAL

## 2023-06-16 MED ORDER — FAMOTIDINE 20 MG PO TABS
20.0000 mg | ORAL_TABLET | Freq: Once | ORAL | Status: AC
  Administered 2023-06-17: 20 mg via ORAL

## 2023-06-17 ENCOUNTER — Encounter
Admission: RE | Disposition: A | Discharge status: Home / Self Care | Payer: Self-pay | Source: Home / Self Care | Attending: Obstetrics and Gynecology

## 2023-06-17 ENCOUNTER — Ambulatory Visit: Payer: Medicare Other | Admitting: Urgent Care

## 2023-06-17 ENCOUNTER — Ambulatory Visit: Payer: Medicare Other | Admitting: Anesthesiology

## 2023-06-17 ENCOUNTER — Encounter: Payer: Self-pay | Admitting: Obstetrics and Gynecology

## 2023-06-17 ENCOUNTER — Ambulatory Visit
Admission: RE | Admit: 2023-06-17 | Discharge: 2023-06-17 | Disposition: A | Discharge status: Home / Self Care | Payer: Medicare Other | Attending: Obstetrics and Gynecology | Admitting: Obstetrics and Gynecology

## 2023-06-17 ENCOUNTER — Other Ambulatory Visit: Payer: Self-pay

## 2023-06-17 DIAGNOSIS — Z79890 Hormone replacement therapy (postmenopausal): Secondary | ICD-10-CM | POA: Insufficient documentation

## 2023-06-17 DIAGNOSIS — I69351 Hemiplegia and hemiparesis following cerebral infarction affecting right dominant side: Secondary | ICD-10-CM | POA: Diagnosis not present

## 2023-06-17 DIAGNOSIS — Z7982 Long term (current) use of aspirin: Secondary | ICD-10-CM | POA: Diagnosis not present

## 2023-06-17 DIAGNOSIS — D649 Anemia, unspecified: Secondary | ICD-10-CM | POA: Diagnosis not present

## 2023-06-17 DIAGNOSIS — E039 Hypothyroidism, unspecified: Secondary | ICD-10-CM | POA: Insufficient documentation

## 2023-06-17 DIAGNOSIS — B954 Other streptococcus as the cause of diseases classified elsewhere: Secondary | ICD-10-CM | POA: Insufficient documentation

## 2023-06-17 DIAGNOSIS — L0231 Cutaneous abscess of buttock: Secondary | ICD-10-CM | POA: Insufficient documentation

## 2023-06-17 DIAGNOSIS — Z7902 Long term (current) use of antithrombotics/antiplatelets: Secondary | ICD-10-CM | POA: Diagnosis not present

## 2023-06-17 DIAGNOSIS — Z85850 Personal history of malignant neoplasm of thyroid: Secondary | ICD-10-CM | POA: Insufficient documentation

## 2023-06-17 DIAGNOSIS — E669 Obesity, unspecified: Secondary | ICD-10-CM | POA: Insufficient documentation

## 2023-06-17 DIAGNOSIS — Z6839 Body mass index (BMI) 39.0-39.9, adult: Secondary | ICD-10-CM | POA: Diagnosis not present

## 2023-06-17 DIAGNOSIS — R9389 Abnormal findings on diagnostic imaging of other specified body structures: Secondary | ICD-10-CM | POA: Insufficient documentation

## 2023-06-17 DIAGNOSIS — M1990 Unspecified osteoarthritis, unspecified site: Secondary | ICD-10-CM | POA: Diagnosis not present

## 2023-06-17 DIAGNOSIS — N766 Ulceration of vulva: Secondary | ICD-10-CM | POA: Insufficient documentation

## 2023-06-17 DIAGNOSIS — Z1629 Resistance to other single specified antibiotic: Secondary | ICD-10-CM | POA: Insufficient documentation

## 2023-06-17 DIAGNOSIS — Z7984 Long term (current) use of oral hypoglycemic drugs: Secondary | ICD-10-CM | POA: Diagnosis not present

## 2023-06-17 DIAGNOSIS — Z87891 Personal history of nicotine dependence: Secondary | ICD-10-CM | POA: Insufficient documentation

## 2023-06-17 DIAGNOSIS — R7303 Prediabetes: Secondary | ICD-10-CM | POA: Diagnosis not present

## 2023-06-17 DIAGNOSIS — N764 Abscess of vulva: Secondary | ICD-10-CM | POA: Insufficient documentation

## 2023-06-17 DIAGNOSIS — N950 Postmenopausal bleeding: Secondary | ICD-10-CM | POA: Diagnosis not present

## 2023-06-17 DIAGNOSIS — N840 Polyp of corpus uteri: Secondary | ICD-10-CM | POA: Insufficient documentation

## 2023-06-17 DIAGNOSIS — Z79899 Other long term (current) drug therapy: Secondary | ICD-10-CM | POA: Insufficient documentation

## 2023-06-17 HISTORY — PX: VULVAR LESION REMOVAL: SHX5391

## 2023-06-17 HISTORY — PX: HYSTEROSCOPY WITH D & C: SHX1775

## 2023-06-17 HISTORY — PX: INCISION AND DRAINAGE ABSCESS: SHX5864

## 2023-06-17 LAB — AEROBIC/ANAEROBIC CULTURE W GRAM STAIN (SURGICAL/DEEP WOUND): Gram Stain: NONE SEEN

## 2023-06-17 SURGERY — DILATATION AND CURETTAGE /HYSTEROSCOPY
Anesthesia: General | Laterality: Right

## 2023-06-17 MED ORDER — PROPOFOL 500 MG/50ML IV EMUL
INTRAVENOUS | Status: DC | PRN
  Administered 2023-06-17: 150 ug/kg/min via INTRAVENOUS

## 2023-06-17 MED ORDER — LACTATED RINGERS IV SOLN
INTRAVENOUS | Status: DC | PRN
Start: 2023-06-17 — End: 2023-06-17

## 2023-06-17 MED ORDER — LACTATED RINGERS IV SOLN: INTRAVENOUS | Status: DC

## 2023-06-17 MED ORDER — FENTANYL CITRATE (PF) 100 MCG/2ML IJ SOLN: 25.0000 ug | INTRAMUSCULAR | Status: DC | PRN

## 2023-06-17 MED ORDER — PROPOFOL 10 MG/ML IV BOLUS
INTRAVENOUS | Status: AC
  Filled 2023-06-17: qty 20

## 2023-06-17 MED ORDER — ACETAMINOPHEN 10 MG/ML IV SOLN: 1000.0000 mg | Freq: Once | INTRAVENOUS | Status: DC | PRN

## 2023-06-17 MED ORDER — ONDANSETRON HCL 4 MG/2ML IJ SOLN: 4.0000 mg | Freq: Once | INTRAMUSCULAR | Status: DC | PRN

## 2023-06-17 MED ORDER — SILVER NITRATE-POT NITRATE 75-25 % EX MISC
CUTANEOUS | Status: DC | PRN
  Administered 2023-06-17: 6 via TOPICAL

## 2023-06-17 MED ORDER — ACETAMINOPHEN 10 MG/ML IV SOLN
INTRAVENOUS | Status: AC
  Filled 2023-06-17: qty 100

## 2023-06-17 MED ORDER — OXYCODONE HCL 5 MG PO TABS
5.0000 mg | ORAL_TABLET | Freq: Once | ORAL | Status: AC | PRN
  Administered 2023-06-17: 5 mg via ORAL

## 2023-06-17 MED ORDER — CHLORHEXIDINE GLUCONATE 0.12 % MT SOLN
OROMUCOSAL | Status: AC
  Filled 2023-06-17: qty 15

## 2023-06-17 MED ORDER — MIDAZOLAM HCL 2 MG/2ML IJ SOLN
INTRAMUSCULAR | Status: DC | PRN
  Administered 2023-06-17: 2 mg via INTRAVENOUS

## 2023-06-17 MED ORDER — FENTANYL CITRATE (PF) 100 MCG/2ML IJ SOLN
INTRAMUSCULAR | Status: AC
  Filled 2023-06-17: qty 2

## 2023-06-17 MED ORDER — FENTANYL CITRATE (PF) 100 MCG/2ML IJ SOLN
INTRAMUSCULAR | Status: DC | PRN
  Administered 2023-06-17: 25 ug via INTRAVENOUS

## 2023-06-17 MED ORDER — OXYCODONE HCL 5 MG PO TABS
ORAL_TABLET | ORAL | Status: AC
  Filled 2023-06-17: qty 1

## 2023-06-17 MED ORDER — ONDANSETRON HCL 4 MG/2ML IJ SOLN
INTRAMUSCULAR | Status: DC | PRN
  Administered 2023-06-17: 4 mg via INTRAVENOUS

## 2023-06-17 MED ORDER — BACITRACIN ZINC 500 UNIT/GM EX OINT
TOPICAL_OINTMENT | CUTANEOUS | Status: AC
  Filled 2023-06-17: qty 28.35

## 2023-06-17 MED ORDER — MIDAZOLAM HCL 2 MG/2ML IJ SOLN
INTRAMUSCULAR | Status: AC
  Filled 2023-06-17: qty 2

## 2023-06-17 MED ORDER — PROPOFOL 1000 MG/100ML IV EMUL
INTRAVENOUS | Status: AC
  Filled 2023-06-17: qty 100

## 2023-06-17 MED ORDER — FAMOTIDINE 20 MG PO TABS
ORAL_TABLET | ORAL | Status: AC
  Filled 2023-06-17: qty 1

## 2023-06-17 MED ORDER — GLYCOPYRROLATE 0.2 MG/ML IJ SOLN
INTRAMUSCULAR | Status: DC | PRN
Start: 2023-06-17 — End: 2023-06-17
  Administered 2023-06-17: .2 mg via INTRAVENOUS

## 2023-06-17 MED ORDER — OXYCODONE HCL 5 MG/5ML PO SOLN: 5.0000 mg | Freq: Once | ORAL | Status: AC | PRN

## 2023-06-17 MED ORDER — SILVER NITRATE-POT NITRATE 75-25 % EX MISC
CUTANEOUS | Status: AC
  Filled 2023-06-17: qty 10

## 2023-06-17 SURGICAL SUPPLY — 22 items
BAG PRESSURE INF REUSE 1000 (BAG) ×3 IMPLANT
BASIN KIT SINGLE STR (MISCELLANEOUS) ×3 IMPLANT
DEVICE MYOSURE LITE (MISCELLANEOUS) IMPLANT
DRSG TELFA 3X8 NADH STRL (GAUZE/BANDAGES/DRESSINGS) IMPLANT
ELECT REM PT RETURN 9FT ADLT (ELECTROSURGICAL) ×3
ELECTRODE REM PT RTRN 9FT ADLT (ELECTROSURGICAL) ×3 IMPLANT
GLOVE BIO SURGEON STRL SZ7 (GLOVE) ×3 IMPLANT
GLOVE INDICATOR 7.5 STRL GRN (GLOVE) ×3 IMPLANT
GOWN STRL REUS W/ TWL LRG LVL3 (GOWN DISPOSABLE) ×6 IMPLANT
GOWN STRL REUS W/TWL LRG LVL3 (GOWN DISPOSABLE) ×6
IV NS IRRIG 3000ML ARTHROMATIC (IV SOLUTION) ×3 IMPLANT
KIT PROCEDURE FLUENT (KITS) ×3 IMPLANT
KIT TURNOVER CYSTO (KITS) ×3 IMPLANT
MANIFOLD NEPTUNE II (INSTRUMENTS) ×3 IMPLANT
PACK DNC HYST (MISCELLANEOUS) ×3 IMPLANT
PAD PREP OB/GYN DISP 24X41 (PERSONAL CARE ITEMS) ×3 IMPLANT
SCRUB CHG 4% DYNA-HEX 4OZ (MISCELLANEOUS) ×3 IMPLANT
SEAL ROD LENS SCOPE MYOSURE (ABLATOR) IMPLANT
SET CYSTO W/LG BORE CLAMP LF (SET/KITS/TRAYS/PACK) IMPLANT
TRAP FLUID SMOKE EVACUATOR (MISCELLANEOUS) ×3 IMPLANT
TUBING CONNECTING 10 (TUBING) ×3 IMPLANT
WATER STERILE IRR 500ML POUR (IV SOLUTION) ×3 IMPLANT

## 2023-06-17 NOTE — Anesthesia Preprocedure Evaluation (Addendum)
Anesthesia Evaluation  Patient identified by MRN, date of birth, ID band Patient awake    Reviewed: Allergy & Precautions, NPO status , Patient's Chart, lab work & pertinent test results  History of Anesthesia Complications Negative for: history of anesthetic complications  Airway Mallampati: III   Neck ROM: Full    Dental  (+) Missing   Pulmonary former smoker (quit 2006)   Pulmonary exam normal breath sounds clear to auscultation       Cardiovascular Exercise Tolerance: Good Normal cardiovascular exam Rhythm:Regular Rate:Normal  ECG 06/11/23: normal   Neuro/Psych CVA (2021, on Plavix; residual right arm weakness)    GI/Hepatic negative GI ROS,,,  Endo/Other  Hypothyroidism  Prediabetes, obesity  Renal/GU negative Renal ROS     Musculoskeletal  (+) Arthritis ,    Abdominal   Peds  Hematology  (+) Blood dyscrasia, anemia   Anesthesia Other Findings   Reproductive/Obstetrics                             Anesthesia Physical Anesthesia Plan  ASA: 3  Anesthesia Plan: General   Post-op Pain Management:    Induction: Intravenous  PONV Risk Score and Plan: 3 and Propofol infusion, TIVA, Treatment may vary due to age or medical condition and Ondansetron  Airway Management Planned: Natural Airway  Additional Equipment:   Intra-op Plan:   Post-operative Plan:   Informed Consent: I have reviewed the patients History and Physical, chart, labs and discussed the procedure including the risks, benefits and alternatives for the proposed anesthesia with the patient or authorized representative who has indicated his/her understanding and acceptance.       Plan Discussed with: CRNA  Anesthesia Plan Comments: (LMA/GETA backup discussed.  Patient consented for risks of anesthesia including but not limited to:  - adverse reactions to medications - damage to eyes, teeth, lips or other oral  mucosa - nerve damage due to positioning  - sore throat or hoarseness - damage to heart, brain, nerves, lungs, other parts of body or loss of life  Informed patient about role of CRNA in peri- and intra-operative care.  Patient voiced understanding.)        Anesthesia Quick Evaluation

## 2023-06-17 NOTE — Transfer of Care (Signed)
Immediate Anesthesia Transfer of Care Note  Patient: [name]  Procedure(s) Performed: DILATATION AND CURETTAGE /HYSTEROSCOPY / POLYPECTOMY INCISION AND DRAINAGE ABSCESS VULVAR LESION (Right)  Patient Location: PACU  Anesthesia Type:MAC  Level of Consciousness: drowsy  Airway & Oxygen Therapy: Patient Spontanous Breathing and Patient connected to face mask oxygen  Post-op Assessment: Report given to RN and Post -op Vital signs reviewed and stable  Post vital signs: Reviewed  Last Vitals:  Vitals Value Taken Time  BP 113/93 06/17/23 0852  Temp 36.1 C 06/17/23 0852  Pulse 71 06/17/23 0856  Resp 11 06/17/23 0856  SpO2 99 % 06/17/23 0856  Vitals shown include unfiled device data.  Last Pain:  Vitals:   06/17/23 0852  TempSrc:   PainSc: 0-No pain         Complications: No notable events documented.

## 2023-06-17 NOTE — Interval H&P Note (Signed)
History and Physical Interval Note:  06/17/2023 7:29 AM  Leah Rogers  has presented today for surgery, with the diagnosis of PMB, thickened endometrium.  The various methods of treatment have been discussed with the patient and family. After consideration of risks, benefits and other options for treatment, the patient has consented to  Procedure(s): DILATATION AND CURETTAGE /HYSTEROSCOPY / POLYPECTOMY (N/A) as a surgical intervention.  The patient's history has been reviewed, patient examined, no change in status, stable for surgery.  I have reviewed the patient's chart and labs.  Questions were answered to the patient's satisfaction.     Christeen Douglas

## 2023-06-17 NOTE — Op Note (Signed)
Operative Report Hysteroscopy with Dilation and Curettage   Indications: Postmenopausal bleeding   Pre-operative Diagnosis: Endometrial polyp   Post-operative Diagnosis: same.  Right vulvar abscess  Procedure: 1. Exam under anesthesia 2. Fractional D&C 3. Hysteroscopy 4. Myosure polypectomy 5.  Right buttocks abscess drainage 6.  Excision of right vulvar lesion  Surgeon: Christeen Douglas, MD  Assistant(s):  None  Anesthesia: General LMA anesthesia  Anesthesiologist: Lenard Simmer, MD Anesthesiologist: Lenard Simmer, MD; Reed Breech, MD CRNA: Merlene Pulling, CRNA  Estimated Blood Loss:  Minimal  Total IV Fluids:  Urine Output: 40ml  Total Fluid Deficit:  220 mL          Specimens: Endocervical curettings, endometrial curettings, myosure polypectomy, right vulvar lesion, and culture taken of the right abscess.         Complications:  None; patient tolerated the procedure well.         Disposition: PACU - hemodynamically stable.         Condition: stable  Findings: Uterus measuring 10 cm by sound; normal cervix, vagina, perineum.  Posterior endometrial polyp noted on the left.  On the right, significant bleeding noted extruding from the right fallopian tube ostium.  On her right buttocks, just lateral to the perineum were several abscesses that drained clear purulent fluid.  Culture was taken.  Closer to the posterior fourchette was an ulcerated lesion, which may have been a healing abscess opening or a herpetic lesion.  This area was biopsied as we did not have a swab to check for viral infection.   Indication for procedure/Consents: 62 y.o. F here for scheduled surgery for the aforementioned diagnoses.     Risks of surgery were discussed with the patient including but not limited to: bleeding which may require transfusion; infection which may require antibiotics; injury to uterus or surrounding organs; intrauterine scarring which may impair future  fertility; need for additional procedures including laparotomy or laparoscopy; and other postoperative/anesthesia complications. Written informed consent was obtained.    Procedure Details:   D&C/ Myosure  The patient was taken to the operating room where anesthesia was administered and was found to be adequate.  After a formal and adequate timeout was performed, she was placed in the dorsal lithotomy position and examined with the above findings. She was then prepped and draped in the sterile manner.   Her bladder was catheterized for an estimated amount of clear, yellow urine. A weighed speculum was then placed in the patient's vagina and a single tooth tenaculum was applied to the anterior lip of the cervix.  An endocervical curetting was performed. Her cervix was serially dilated to 15 [place] using Hanks dilators.  The hysteroscope was introduced under direct observation  Using lactated ringers as a distention medium to reveal the above findings. The uterine cavity was carefully examined, both ostia were recognized, and diffusely atrophic endometrium with the polyp above were noted.   This was resected using the Myosure device.  After further careful visualization of the uterine cavity, the hysteroscope was removed under direct visualization.  A sharp curettage was then performed until there was a gritty texture in all four quadrants.  The tenaculum was removed from the anterior lip of the cervix and the vaginal speculum was removed after applying silver nitrate/pressure for good hemostasis.   The patient tolerated the procedure well and was taken to the recovery area awake and in stable condition. She received iv acetaminophen and Toradol prior to leaving the OR.  The patient will be  discharged to home as per PACU criteria. Routine postoperative instructions given.  She was prescribed Ibuprofen and Colace.  She will follow up in the clinic in two weeks for postoperative evaluation.

## 2023-06-17 NOTE — Discharge Instructions (Addendum)
Discharge instructions after a hysteroscopy with dilation and curettage  Please try to stay hydrated, and prevent constipation after anesthesia when you get home.  Also, keep a cover or even Vaseline or bacitracin over the right buttocks where I drained the abscesses during the surgery.  Watch out for signs of infection, which would be increasing pain, redness or warmth in that area.  Please call me if you have any of those things.  Signs and Symptoms to Report  Call our office at [phone] if you have any of the following:    Fever over 100.4 degrees or higher  Severe stomach pain not relieved with pain medications  Bright red bleeding that's heavier than a period that does not slow with rest after the first 24 hours  To go the bathroom a lot (frequency), you can't hold your urine (urgency), or it hurts when you empty your bladder (urinate)  Chest pain  Shortness of breath  Pain in the calves of your legs  Severe nausea and vomiting not relieved with anti-nausea medications  Any concerns  What You Can Expect after Surgery  You may see some pink tinged, bloody fluid. This is normal. You may also have cramping for several days.   Activities after Your Discharge Follow these guidelines to help speed your recovery at home:  Don't drive if you are in pain or taking narcotic pain medicine. You may drive when you can safely slam on the brakes, turn the wheel forcefully, and rotate your torso comfortably. This is typically 4-7 days. Practice in a parking lot or side street prior to attempting to drive regularly.   Ask others to help with household chores for 4 weeks.  Don't do strenuous activities, exercises, or sports like vacuuming, tennis, squash, etc. until your doctor says it is safe to do so.  Walk as you feel able. Rest often since it may take a week or two for your energy level to return to normal.   You may climb stairs  Avoid constipation:   -Eat fruits, vegetables, and whole  grains. Eat small meals as your appetite will take time to return to normal.   -Drink 6 to 8 glasses of water each day unless your doctor has told you to limit your fluids.   -Use a laxative or stool softener as needed if constipation becomes a problem. You may take Miralax, metamucil, Citrucil, Colace, Senekot, FiberCon, etc. If this does not relieve the constipation, try two tablespoons of Milk Of Magnesia every 8 hours until your bowels move.   You may shower.   Do not get in a hot tub, swimming pool, etc. until your doctor agrees.  Do not douche, use tampons, or have sex until your doctor says it is okay, usually about 2 weeks.  Take your pain medicine when you need it. The medicine may not work as well if the pain is bad.  Take the medicines you were taking before surgery. Other medications you might need are pain medications (ibuprofen), medications for constipation (Colace) and nausea medications (Zofran).   AMBULATORY SURGERY  DISCHARGE INSTRUCTIONS   The drugs that you were given will stay in your system until tomorrow so for the next 24 hours you should not:  Drive an automobile Make any legal decisions Drink any alcoholic beverage   You may resume regular meals tomorrow.  Today it is better to start with liquids and gradually work up to solid foods.  You may eat anything you prefer, but it  is better to start with liquids, then soup and crackers, and gradually work up to solid foods.   Please notify your doctor immediately if you have any unusual bleeding, trouble breathing, redness and pain at the surgery site, drainage, fever, or pain not relieved by medication.    Additional Instructions:        Please contact your physician with any problems or Same Day Surgery at [phone], Monday through Friday 6 am to 4 pm, or [place] at [facility] number at [phone].

## 2023-06-18 LAB — SURGICAL PATHOLOGY

## 2023-06-18 NOTE — Anesthesia Postprocedure Evaluation (Signed)
Anesthesia Post Note  Patient: Leah Rogers  Procedure(s) Performed: DILATATION AND CURETTAGE /HYSTEROSCOPY / POLYPECTOMY INCISION AND DRAINAGE ABSCESS VULVAR LESION (Right)  Patient location during evaluation: PACU Anesthesia Type: General Level of consciousness: awake and alert Pain management: pain level controlled Vital Signs Assessment: post-procedure vital signs reviewed and stable Respiratory status: spontaneous breathing, nonlabored ventilation, respiratory function stable and patient connected to nasal cannula oxygen Cardiovascular status: blood pressure returned to baseline and stable Postop Assessment: no apparent nausea or vomiting Anesthetic complications: no   No notable events documented.   Last Vitals:  Vitals:   06/17/23 0915 06/17/23 0937  BP: 113/76 115/80  Pulse: 64 71  Resp: 12 14  Temp: (!) 36.1 C (!) 36.1 C  SpO2: 100% 98%    Last Pain:  Vitals:   06/17/23 0937  TempSrc: Temporal  PainSc: 1                  Lenard Simmer

## 2023-06-22 LAB — AEROBIC/ANAEROBIC CULTURE W GRAM STAIN (SURGICAL/DEEP WOUND)

## 2023-07-13 ENCOUNTER — Ambulatory Visit: Payer: Disability Insurance | Admitting: Dermatology

## 2023-08-20 ENCOUNTER — Other Ambulatory Visit: Payer: Self-pay | Admitting: Internal Medicine

## 2023-08-20 DIAGNOSIS — E782 Mixed hyperlipidemia: Secondary | ICD-10-CM

## 2023-08-24 ENCOUNTER — Other Ambulatory Visit: Payer: Self-pay | Admitting: Internal Medicine

## 2023-08-24 DIAGNOSIS — I6930 Unspecified sequelae of cerebral infarction: Secondary | ICD-10-CM

## 2023-08-24 NOTE — Telephone Encounter (Signed)
Requested Prescriptions  Pending Prescriptions Disp Refills   atorvastatin (LIPITOR) 80 MG tablet [Pharmacy Med Name: Atorvastatin Calcium 80 MG Oral Tablet] 90 tablet 1    Sig: Take 1 tablet by mouth once daily     Cardiovascular:  Antilipid - Statins Failed - 08/20/2023  6:57 AM      Failed - Lipid Panel in normal range within the last 12 months    Cholesterol, Total  Date Value Ref Range Status  02/18/2023 120 100 - 199 mg/dL Final   LDL Chol Calc (NIH)  Date Value Ref Range Status  02/18/2023 62 0 - 99 mg/dL Final   HDL  Date Value Ref Range Status  02/18/2023 44 >39 mg/dL Final   Triglycerides  Date Value Ref Range Status  02/18/2023 64 0 - 149 mg/dL Final         Passed - Patient is not pregnant      Passed - Valid encounter within last 12 months    Recent Outpatient Visits           6 months ago Dysfunctional uterine bleeding   [place] [facility] at [facility], Nyoka Cowden, MD   10 months ago Axillary hidradenitis suppurativa   [place] [facility] at [facility], Nyoka Cowden, MD   1 year ago Annual physical exam   [facility] Medicine at [facility], Nyoka Cowden, MD   1 year ago Sacroiliac strain, initial encounter   [facility] Medicine at [facility], Nyoka Cowden, MD   2 years ago Annual physical exam   [facility] Care & [facility] Medicine at [facility], Nyoka Cowden, MD       Future Appointments             In 6 months Judithann Graves, Nyoka Cowden, MD [facility] Primary Care & Sports Medicine at [facility], [facility]   In 8 months Elie Goody, MD [facility]

## 2023-08-26 DIAGNOSIS — E890 Postprocedural hypothyroidism: Secondary | ICD-10-CM | POA: Diagnosis not present

## 2023-08-26 NOTE — Telephone Encounter (Signed)
Requested Prescriptions  Pending Prescriptions Disp Refills   clopidogrel (PLAVIX) 75 MG tablet [Pharmacy Med Name: Clopidogrel Bisulfate 75 MG Oral Tablet] 90 tablet 0    Sig: Take 1 tablet by mouth once daily     Hematology: Antiplatelets - clopidogrel Failed - 08/24/2023  6:51 AM      Failed - Valid encounter within last 6 months    Recent Outpatient Visits           6 months ago Dysfunctional uterine bleeding   [place] [facility] at [facility], Nyoka Cowden, MD   10 months ago Axillary hidradenitis suppurativa   [place] [facility] at [facility], Nyoka Cowden, MD   1 year ago Annual physical exam   [place] [facility] at [facility], Nyoka Cowden, MD   1 year ago Sacroiliac strain, initial encounter   [facility] at [facility], Nyoka Cowden, MD   2 years ago Annual physical exam   [place] [facility] at [facility], Nyoka Cowden, MD       Future Appointments             In 6 months Judithann Graves Nyoka Cowden, MD [facility] Primary Care & Sports Medicine at [facility], [facility]   In 8 months Elie Goody, MD [facility]            Passed - HCT in normal range and within 180 days    HCT  Date Value Ref Range Status  06/11/2023 37.0 36.0 - 46.0 % Final   Hematocrit  Date Value Ref Range Status  02/18/2023 37.0 34.0 - 46.6 % Final         Passed - HGB in normal range and within 180 days    Hemoglobin  Date Value Ref Range Status  06/11/2023 12.4 12.0 - 15.0 g/dL Final  40/98/1191 47.8 11.1 - 15.9 g/dL Final         Passed - PLT in normal range and within 180 days    Platelets  Date Value Ref Range Status  06/11/2023 209 150 - 400 K/uL Final  02/18/2023 203 150 - 450 x10E3/uL Final         Passed - Cr in normal range and within 360 days    Creatinine, Ser  Date Value  Ref Range Status  06/11/2023 0.95 0.44 - 1.00 mg/dL Final

## 2023-11-09 DIAGNOSIS — E890 Postprocedural hypothyroidism: Secondary | ICD-10-CM | POA: Diagnosis not present

## 2023-11-21 ENCOUNTER — Other Ambulatory Visit: Payer: Self-pay | Admitting: Internal Medicine

## 2023-11-21 DIAGNOSIS — I6930 Unspecified sequelae of cerebral infarction: Secondary | ICD-10-CM

## 2023-11-22 NOTE — Telephone Encounter (Signed)
 Requested medications are due for refill today.  yes  Requested medications are on the active medications list.  yes  Last refill. 08/26/2023 #90 0 rf  Future visit scheduled.   yes  Notes to clinic.  It appears that Dr. Judithann Graves sees this pt yearly. Please review.    Requested Prescriptions  Pending Prescriptions Disp Refills   clopidogrel (PLAVIX) 75 MG tablet [Pharmacy Med Name: Clopidogrel Bisulfate 75 MG Oral Tablet] 90 tablet 0    Sig: Take 1 tablet by mouth once daily     Hematology: Antiplatelets - clopidogrel Failed - 11/22/2023  4:53 PM      Failed - Valid encounter within last 6 months    Recent Outpatient Visits           9 months ago Dysfunctional uterine bleeding   [place] [facility] at [facility], Nyoka Cowden, MD   1 year ago Axillary hidradenitis suppurativa   [place] [facility] at [facility], Nyoka Cowden, MD   1 year ago Annual physical exam   [place] [facility] at [facility], Nyoka Cowden, MD   2 years ago Sacroiliac strain, initial encounter   [facility] Medicine at [facility], Nyoka Cowden, MD   2 years ago Annual physical exam    [facility] & Sports Medicine at [facility], Nyoka Cowden, MD       Future Appointments             In 3 months Judithann Graves Nyoka Cowden, MD [facility] Primary Care & Sports Medicine at [facility], [facility]   In 5 months Elie Goody, MD [facility]            Passed - HCT in normal range and within 180 days    HCT  Date Value Ref Range Status  06/11/2023 37.0 36.0 - 46.0 % Final   Hematocrit  Date Value Ref Range Status  02/18/2023 37.0 34.0 - 46.6 % Final         Passed - HGB in normal range and within 180 days    Hemoglobin  Date Value Ref Range Status  06/11/2023 12.4 12.0 - 15.0 g/dL Final  78/29/5621 30.8 11.1 - 15.9 g/dL Final          Passed - PLT in normal range and within 180 days    Platelets  Date Value Ref Range Status  06/11/2023 209 150 - 400 K/uL Final  02/18/2023 203 150 - 450 x10E3/uL Final         Passed - Cr in normal range and within 360 days    Creatinine, Ser  Date Value Ref Range Status  06/11/2023 0.95 0.44 - 1.00 mg/dL Final

## 2023-11-23 ENCOUNTER — Other Ambulatory Visit: Payer: Self-pay

## 2023-11-23 DIAGNOSIS — Z85850 Personal history of malignant neoplasm of thyroid: Secondary | ICD-10-CM | POA: Diagnosis not present

## 2023-11-23 DIAGNOSIS — E890 Postprocedural hypothyroidism: Secondary | ICD-10-CM | POA: Diagnosis not present

## 2023-12-18 ENCOUNTER — Other Ambulatory Visit: Payer: Self-pay | Admitting: Internal Medicine

## 2023-12-18 DIAGNOSIS — I6930 Unspecified sequelae of cerebral infarction: Secondary | ICD-10-CM

## 2023-12-20 NOTE — Telephone Encounter (Signed)
 Requested medications are due for refill today.  yes  Requested medications are on the active medications list.  yes  Last refill. 11/23/2023 #30 0 rf  Future visit scheduled.   Yes in June  Notes to clinic.  Pt is more thatn 1    Requested Prescriptions  Pending Prescriptions Disp Refills   clopidogrel (PLAVIX) 75 MG tablet [Pharmacy Med Name: Clopidogrel Bisulfate 75 MG Oral Tablet] 30 tablet 0    Sig: Take 1 tablet by mouth once daily     Hematology: Antiplatelets - clopidogrel Failed - 12/20/2023  5:30 PM      Failed - HCT in normal range and within 180 days    HCT  Date Value Ref Range Status  06/11/2023 37.0 36.0 - 46.0 % Final   Hematocrit  Date Value Ref Range Status  02/18/2023 37.0 34.0 - 46.6 % Final         Failed - HGB in normal range and within 180 days    Hemoglobin  Date Value Ref Range Status  06/11/2023 12.4 12.0 - 15.0 g/dL Final  16/06/9603 54.0 11.1 - 15.9 g/dL Final         Failed - PLT in normal range and within 180 days    Platelets  Date Value Ref Range Status  06/11/2023 209 150 - 400 K/uL Final  02/18/2023 203 150 - 450 x10E3/uL Final         Failed - Valid encounter within last 6 months    Recent Outpatient Visits   None     Future Appointments             In 2 months Judithann Graves, Nyoka Cowden, MD [facility] Health Primary Care & Sports Medicine at [facility], [facility]   In 4 months Elie Goody, MD [facility]            Passed - Cr in normal range and within 360 days    Creatinine, Ser  Date Value Ref Range Status  06/11/2023 0.95 0.44 - 1.00 mg/dL Final

## 2024-01-23 ENCOUNTER — Other Ambulatory Visit: Payer: Self-pay | Admitting: Internal Medicine

## 2024-01-23 DIAGNOSIS — I6930 Unspecified sequelae of cerebral infarction: Secondary | ICD-10-CM

## 2024-01-25 NOTE — Telephone Encounter (Signed)
 Requested medications are due for refill today.  yes  Requested medications are on the active medications list.  yes  Last refill. 12/21/2023 #30 0 rf  Future visit scheduled.   yes  Notes to clinic.  Labs are expired.    Requested Prescriptions  Pending Prescriptions Disp Refills   clopidogrel  (PLAVIX ) 75 MG tablet [Pharmacy Med Name: Clopidogrel  Bisulfate 75 MG Oral Tablet] 30 tablet 0    Sig: Take 1 tablet by mouth once daily     Hematology: Antiplatelets - clopidogrel  Failed - 01/25/2024 12:22 PM      Failed - HCT in normal range and within 180 days    HCT  Date Value Ref Range Status  06/11/2023 37.0 36.0 - 46.0 % Final   Hematocrit  Date Value Ref Range Status  02/18/2023 37.0 34.0 - 46.6 % Final         Failed - HGB in normal range and within 180 days    Hemoglobin  Date Value Ref Range Status  06/11/2023 12.4 12.0 - 15.0 g/dL Final  16/06/9603 54.0 11.1 - 15.9 g/dL Final         Failed - PLT in normal range and within 180 days    Platelets  Date Value Ref Range Status  06/11/2023 209 150 - 400 K/uL Final  02/18/2023 203 150 - 450 x10E3/uL Final         Failed - Valid encounter within last 6 months    Recent Outpatient Visits   None     Future Appointments             In 4 weeks Sheron Dixons, MD [facility] Primary Care & Sports Medicine at [facility], [facility]   In 3 months Harris Liming, MD [facility]            Passed - Cr in normal range and within 360 days    Creatinine, Ser  Date Value Ref Range Status  06/11/2023 0.95 0.44 - 1.00 mg/dL Final

## 2024-02-16 ENCOUNTER — Other Ambulatory Visit: Payer: Self-pay | Admitting: Internal Medicine

## 2024-02-16 DIAGNOSIS — E782 Mixed hyperlipidemia: Secondary | ICD-10-CM

## 2024-02-18 NOTE — Telephone Encounter (Signed)
 Requested Prescriptions  Pending Prescriptions Disp Refills   atorvastatin  (LIPITOR) 80 MG tablet [Pharmacy Med Name: Atorvastatin  Calcium  80 MG Oral Tablet] 90 tablet 0    Sig: Take 1 tablet by mouth once daily     Cardiovascular:  Antilipid - Statins Failed - 02/18/2024 11:24 AM      Failed - Valid encounter within last 12 months    Recent Outpatient Visits   None     Future Appointments             In 4 days Sheron Dixons, MD [facility] Primary Care & Sports Medicine at [facility], [facility]   In 2 months Harris Liming, MD [facility]            Failed - Lipid Panel in normal range within the last 12 months    Cholesterol, Total  Date Value Ref Range Status  02/18/2023 120 100 - 199 mg/dL Final   LDL Chol Calc (NIH)  Date Value Ref Range Status  02/18/2023 62 0 - 99 mg/dL Final   HDL  Date Value Ref Range Status  02/18/2023 44 >39 mg/dL Final   Triglycerides  Date Value Ref Range Status  02/18/2023 64 0 - 149 mg/dL Final         Passed - Patient is not pregnant

## 2024-02-22 ENCOUNTER — Other Ambulatory Visit: Payer: Self-pay | Admitting: Internal Medicine

## 2024-02-22 ENCOUNTER — Ambulatory Visit (INDEPENDENT_AMBULATORY_CARE_PROVIDER_SITE_OTHER): Payer: Self-pay | Admitting: Internal Medicine

## 2024-02-22 ENCOUNTER — Encounter: Payer: Self-pay | Admitting: Internal Medicine

## 2024-02-22 VITALS — BP 116/80 | HR 86 | Ht 64.0 in | Wt 224.2 lb

## 2024-02-22 DIAGNOSIS — I6930 Unspecified sequelae of cerebral infarction: Secondary | ICD-10-CM | POA: Diagnosis not present

## 2024-02-22 DIAGNOSIS — R040 Epistaxis: Secondary | ICD-10-CM | POA: Diagnosis not present

## 2024-02-22 DIAGNOSIS — L732 Hidradenitis suppurativa: Secondary | ICD-10-CM

## 2024-02-22 DIAGNOSIS — Z1231 Encounter for screening mammogram for malignant neoplasm of breast: Secondary | ICD-10-CM | POA: Diagnosis not present

## 2024-02-22 DIAGNOSIS — Z0000 Encounter for general adult medical examination without abnormal findings: Secondary | ICD-10-CM | POA: Diagnosis not present

## 2024-02-22 DIAGNOSIS — Z85850 Personal history of malignant neoplasm of thyroid: Secondary | ICD-10-CM

## 2024-02-22 DIAGNOSIS — R7303 Prediabetes: Secondary | ICD-10-CM

## 2024-02-22 DIAGNOSIS — E782 Mixed hyperlipidemia: Secondary | ICD-10-CM

## 2024-02-22 MED ORDER — CLOPIDOGREL BISULFATE 75 MG PO TABS: 75.0000 mg | ORAL_TABLET | Freq: Every day | ORAL | 2 refills | Status: DC

## 2024-02-22 NOTE — Assessment & Plan Note (Signed)
 Minimal dysarthria since stroke. Maintained on plavix  and ASA for secondary prevention.

## 2024-02-22 NOTE — Assessment & Plan Note (Signed)
 high BMI addressing with diet changes.  Has lost 20 lbs in the past year. associated conditions are HLD and PreDM

## 2024-02-22 NOTE — Assessment & Plan Note (Signed)
 LDL is  Lab Results  Component Value Date   LDLCALC 62 02/18/2023   Current regimen is atorvastatin .  No medication side effects noted. Goal LDL is <100.

## 2024-02-22 NOTE — Patient Instructions (Signed)
 Call [facility] to schedule your mammogram at [phone].

## 2024-02-22 NOTE — Assessment & Plan Note (Signed)
 Followed by Dermatology On MTF and Spironolactone  to control outbreaks

## 2024-02-22 NOTE — Assessment & Plan Note (Addendum)
 Continue diet changes for ongoing weight loss. She has lost 20 lbs since last year.

## 2024-02-22 NOTE — Assessment & Plan Note (Signed)
 On synthroid  and followed by ENT Dr. Juengel.

## 2024-02-22 NOTE — Progress Notes (Signed)
 Date:  02/22/2024   Name:  Leah Rogers   DOB:  1961-07-10   MRN:  [id number]   Chief Complaint: Annual Exam and Epistaxis (Patient said she having at least once a month nose bleeds, started 8 months ago, happens at night, stops bleeding after a couple of seconds) Leah Rogers is a 63 y.o. female who presents today for her Complete Annual Exam. She feels fairly well. She reports exercising walks on treadmill, 2 times a week, 20 minutes. She reports she is sleeping fairly well. Breast complaints none.  Health Maintenance  Topic Date Due   Medicare Annual Wellness Visit  Never done   Zoster (Shingles) Vaccine (1 of 2) 05/24/2024*   COVID-19 Vaccine (4 - 2024-25 season) 03/09/2025*   Mammogram  03/16/2024   Flu Shot  04/21/2024   Colon Cancer Screening  03/20/2026   Pap with HPV screening  12/31/2026   Hepatitis C Screening  Completed   HIV Screening  Completed   HPV Vaccine  Aged Out   Meningitis B Vaccine  Aged Out   DTaP/Tdap/Td vaccine  Discontinued  *Topic was postponed. The date shown is not the original due date.    Hyperlipidemia This is a chronic problem. The problem is controlled. Pertinent negatives include no chest pain, myalgias or shortness of breath. Current antihyperlipidemic treatment includes statins. The current treatment provides significant improvement of lipids.  Thyroid  Problem Presents for follow-up visit. Patient reports no anxiety, constipation, diarrhea, fatigue or palpitations. The symptoms have been stable. Her past medical history is significant for hyperlipidemia.  Diabetes She presents for her follow-up diabetic visit. Diabetes type: prediabetes. Her disease course has been stable. Pertinent negatives for hypoglycemia include no dizziness, headaches or nervousness/anxiousness. Pertinent negatives for diabetes include no chest pain, no fatigue and no weakness.  Nose bleeds -recurrent with the last about one month ago.  Occurs at night without known  trauma.  Review of Systems  Constitutional:  Negative for fatigue and unexpected weight change.  HENT:  Positive for nosebleeds. Negative for trouble swallowing.   Eyes:  Negative for visual disturbance.  Respiratory:  Negative for cough, chest tightness, shortness of breath and wheezing.   Cardiovascular:  Negative for chest pain, palpitations and leg swelling.  Gastrointestinal:  Negative for abdominal pain, constipation and diarrhea.  Genitourinary:  Negative for dysuria and hematuria.  Musculoskeletal:  Positive for arthralgias (right knee). Negative for myalgias.  Neurological:  Negative for dizziness, weakness, light-headedness and headaches.  Psychiatric/Behavioral:  Negative for dysphoric mood and sleep disturbance. The patient is not nervous/anxious.      Lab Results  Component Value Date   NA 139 06/11/2023   K 4.0 06/11/2023   CO2 24 06/11/2023   GLUCOSE 89 06/11/2023   BUN 24 (H) 06/11/2023   CREATININE 0.95 06/11/2023   CALCIUM  8.3 (L) 06/11/2023   EGFR 71 02/18/2023   GFRNONAA >60 06/11/2023   Lab Results  Component Value Date   CHOL 120 02/18/2023   HDL 44 02/18/2023   LDLCALC 62 02/18/2023   TRIG 64 02/18/2023   CHOLHDL 2.7 02/18/2023   Lab Results  Component Value Date   TSH 0.273 (L) 02/18/2023   Lab Results  Component Value Date   HGBA1C 6.0 (H) 02/18/2023   Lab Results  Component Value Date   WBC 7.3 06/11/2023   HGB 12.4 06/11/2023   HCT 37.0 06/11/2023   MCV 93.9 06/11/2023   PLT 209 06/11/2023   Lab Results  Component Value  Date   ALT 17 02/18/2023   AST 17 02/18/2023   ALKPHOS 92 02/18/2023   BILITOT <0.2 02/18/2023   Lab Results  Component Value Date   VD25OH 45.4 01/02/2016     Patient Active Problem List   Diagnosis Date Noted   Obesity, morbid (HCC) 02/22/2024   Prediabetes 02/18/2023   Post-menopausal bleeding 02/18/2023   Acquired thrombophilia (HCC) 12/30/2021   Bilateral carotid artery stenosis 12/26/2020   Mixed  hyperlipidemia 06/14/2020   Urinary incontinence in female 02/12/2020   Primary osteoarthritis of both knees 02/12/2020   History of ischemic cerebrovascular accident (CVA) with residual deficit 02/06/2020   Hx of papillary thyroid  carcinoma 10/14/2015   Hidradenitis axillaris 09/02/2015    No Known Allergies  Past Surgical History:  Procedure Laterality Date   COLONOSCOPY WITH PROPOFOL  N/A 03/20/2016   Procedure: COLONOSCOPY WITH PROPOFOL ;  Surgeon: Marnee Sink, MD;  Location: [facility];  Service: Endoscopy;  Laterality: N/A;   ENDOMETRIAL BIOPSY  09/22/2011   benign- post menopausal bleeding   HYSTEROSCOPY WITH D & C N/A 06/17/2023   Procedure: DILATATION AND CURETTAGE /HYSTEROSCOPY / POLYPECTOMY;  Surgeon: Prescilla Brod, MD;  Location: [facility];  Service: Gynecology;  Laterality: N/A;   INCISION AND DRAINAGE ABSCESS  06/17/2023   Procedure: INCISION AND DRAINAGE ABSCESS;  Surgeon: Prescilla Brod, MD;  Location: [facility];  Service: Gynecology;;   REPLACEMENT TOTAL KNEE Left    THYROIDECTOMY N/A 10/14/2015   Procedure: THYROIDECTOMY;  Surgeon: Mellody Sprout, MD;  Location: [facility];  Service: ENT;  Laterality: N/A;   TUBAL LIGATION     VULVAR LESION REMOVAL Right 06/17/2023   Procedure: VULVAR LESION;  Surgeon: Prescilla Brod, MD;  Location: [facility];  Service: Gynecology;  Laterality: Right;    Social History   Tobacco Use   Smoking status: Former    Current packs/day: 0.00    Average packs/day: 0.5 packs/day for 18.0 years (9.0 ttl pk-yrs)    Types: Cigarettes    Start date: 03/22/1987    Quit date: 03/21/2005    Years since quitting: 18.9   Smokeless tobacco: Never  Vaping Use   Vaping status: Never Used  Substance Use Topics   Alcohol use: No    Alcohol/week: 0.0 standard drinks of alcohol   Drug use: No     Medication list has been reviewed and updated.  Current Meds  Medication Sig   atorvastatin  (LIPITOR) 80 MG tablet Take 1 tablet by mouth once  daily   calcium  gluconate 500 MG tablet Take 2 tablets by mouth daily.   cholecalciferol (VITAMIN D3) 25 MCG (1000 UNIT) tablet Take 1,000 Units by mouth daily.   EQ ASPIRIN  ADULT LOW DOSE 81 MG EC tablet Take 81 mg by mouth daily.   ferrous sulfate  325 (65 FE) MG tablet Take 325 mg by mouth daily with breakfast.   levothyroxine  (SYNTHROID ) 112 MCG tablet Take 112 mcg by mouth daily before breakfast.   metFORMIN  (GLUCOPHAGE -XR) 500 MG 24 hr tablet I po bid (Patient taking differently: 500 mg 2 (two) times daily with a meal. Pt takes this for Hydradenitis NOT diabetes)   spironolactone  (ALDACTONE ) 100 MG tablet Take 1 tablet (100 mg total) by mouth at bedtime.   [DISCONTINUED] clopidogrel  (PLAVIX ) 75 MG tablet Take 1 tablet by mouth once daily       02/22/2024    9:12 AM 02/18/2023    9:25 AM 12/30/2021    9:26 AM 10/20/2021   11:19 AM  GAD 7 :  Generalized Anxiety Score  Nervous, Anxious, on Edge 0 1 0 0  Control/stop worrying 0 1 0 0  Worry too much - different things 0 1 0 0  Trouble relaxing 0 1 0 1  Restless 0 1 0 0  Easily annoyed or irritable 0 1 0 0  Afraid - awful might happen 0 1 1 0  Total GAD 7 Score 0 7 1 1   Anxiety Difficulty Not difficult at all Not difficult at all         02/22/2024    9:12 AM 02/22/2024    9:11 AM 02/18/2023    9:25 AM  Depression screen PHQ 2/9  Decreased Interest 0 0 1  Down, Depressed, Hopeless 0 0 1  PHQ - 2 Score 0 0 2  Altered sleeping 0  1  Tired, decreased energy 1  1  Change in appetite 0  1  Feeling bad or failure about yourself  0  1  Trouble concentrating 0  1  Moving slowly or fidgety/restless 0  1  Suicidal thoughts 0  0  PHQ-9 Score 1  8  Difficult doing work/chores Not difficult at all  Somewhat difficult    BP Readings from Last 3 Encounters:  02/22/24 116/80  06/17/23 115/80  05/13/23 110/77    Physical Exam Vitals and nursing note reviewed.  Constitutional:      General: She is not in acute distress.    Appearance:  Normal appearance. She is well-developed.  HENT:     Head: Normocephalic and atraumatic.     Right Ear: Tympanic membrane and ear canal normal.     Left Ear: Tympanic membrane and ear canal normal.     Nose: Nose normal.     Right Sinus: No maxillary sinus tenderness.     Left Sinus: No maxillary sinus tenderness.     Mouth/Throat:     Mouth: Mucous membranes are moist.  Eyes:     General: No scleral icterus.       Right eye: No discharge.        Left eye: No discharge.     Conjunctiva/sclera: Conjunctivae normal.  Neck:     Thyroid : No thyromegaly.     Vascular: No carotid bruit.  Cardiovascular:     Rate and Rhythm: Normal rate and regular rhythm.     Pulses: Normal pulses.     Heart sounds: Normal heart sounds.  Pulmonary:     Effort: Pulmonary effort is normal. No respiratory distress.     Breath sounds: No wheezing.  Abdominal:     General: Bowel sounds are normal.     Palpations: Abdomen is soft.     Tenderness: There is no abdominal tenderness.  Musculoskeletal:     Cervical back: Normal range of motion. No erythema.     Right lower leg: No edema.     Left lower leg: No edema.  Lymphadenopathy:     Cervical: No cervical adenopathy.  Skin:    General: Skin is warm and dry.     Capillary Refill: Capillary refill takes less than 2 seconds.     Findings: No rash.  Neurological:     General: No focal deficit present.     Mental Status: She is alert and oriented to person, place, and time.     Cranial Nerves: No cranial nerve deficit.     Sensory: No sensory deficit.     Deep Tendon Reflexes: Reflexes are normal and symmetric.  Psychiatric:  Attention and Perception: Attention normal.        Mood and Affect: Mood normal.        Behavior: Behavior normal.     Wt Readings from Last 3 Encounters:  02/22/24 224 lb 4 oz (101.7 kg)  06/09/23 234 lb 5.6 oz (106.3 kg)  02/18/23 246 lb (111.6 kg)    BP 116/80   Pulse 86   Ht 5\' 4"  (1.626 m)   Wt 224 lb 4 oz  (101.7 kg)   SpO2 99%   BMI 38.49 kg/m   Assessment and Plan:  Problem List Items Addressed This Visit       Unprioritized   Hidradenitis axillaris   Followed by Dermatology On MTF and Spironolactone  to control outbreaks      Hx of papillary thyroid  carcinoma   On synthroid  and followed by ENT Dr. Juengel.      Relevant Orders   TSH + free T4   History of ischemic cerebrovascular accident (CVA) with residual deficit   Minimal dysarthria since stroke. Maintained on plavix  and ASA for secondary prevention.      Relevant Medications   clopidogrel  (PLAVIX ) 75 MG tablet   Other Relevant Orders   CBC with Differential/Platelet   Mixed hyperlipidemia   LDL is  Lab Results  Component Value Date   LDLCALC 62 02/18/2023   Current regimen is atorvastatin .  No medication side effects noted. Goal LDL is <100.       Relevant Orders   Lipid panel   Prediabetes   Continue diet changes for ongoing weight loss. She has lost 20 lbs since last year.      Relevant Orders   Comprehensive metabolic panel with GFR   Hemoglobin A1c   Obesity, morbid (HCC)   high BMI addressing with diet changes.  Has lost 20 lbs in the past year. associated conditions are HLD and PreDM       Other Visit Diagnoses       Annual physical exam    -  Primary   up to date on immunizations and screenings     Encounter for screening mammogram for breast cancer       schedule at [facility]   Relevant Orders   MM 3D SCREENING MAMMOGRAM BILATERAL BREAST     Epistaxis       seeing Dr. Juengel for thyroid  next month and will discuss it with him   Relevant Orders   CBC with Differential/Platelet       Return in about 1 year (around 02/21/2025) for CPX Dr Cari Char.    Sheron Dixons, MD [facility] [place]

## 2024-02-23 ENCOUNTER — Ambulatory Visit: Payer: Self-pay | Admitting: Internal Medicine

## 2024-02-23 LAB — LIPID PANEL
Chol/HDL Ratio: 2.5 ratio (ref 0.0–4.4)
Cholesterol, Total: 124 mg/dL (ref 100–199)
HDL: 49 mg/dL (ref >39)
LDL Chol Calc (NIH): 60 mg/dL (ref 0–99)
Triglycerides: 71 mg/dL (ref 0–149)
VLDL Cholesterol Cal: 15 mg/dL (ref 5–40)

## 2024-02-23 LAB — COMPREHENSIVE METABOLIC PANEL WITH GFR
ALT: 21 IU/L (ref 0–32)
AST: 21 IU/L (ref 0–40)
Albumin: 4.2 g/dL (ref 3.9–4.9)
Alkaline Phosphatase: 95 IU/L (ref 44–121)
BUN/Creatinine Ratio: 26 (ref 12–28)
BUN: 29 mg/dL — ABNORMAL HIGH (ref 8–27)
Bilirubin Total: 0.2 mg/dL (ref 0.0–1.2)
CO2: 19 mmol/L — ABNORMAL LOW (ref 20–29)
Calcium: 9.4 mg/dL (ref 8.7–10.3)
Chloride: 99 mmol/L (ref 96–106)
Creatinine, Ser: 1.12 mg/dL — ABNORMAL HIGH (ref 0.57–1.00)
Globulin, Total: 3.3 g/dL (ref 1.5–4.5)
Glucose: 85 mg/dL (ref 70–99)
Potassium: 4.7 mmol/L (ref 3.5–5.2)
Sodium: 136 mmol/L (ref 134–144)
Total Protein: 7.5 g/dL (ref 6.0–8.5)
eGFR: 55 mL/min/{1.73_m2} — ABNORMAL LOW (ref >59)

## 2024-02-23 LAB — HEMOGLOBIN A1C
Est. average glucose Bld gHb Est-mCnc: 123 mg/dL
Hgb A1c MFr Bld: 5.9 % — ABNORMAL HIGH (ref 4.8–5.6)

## 2024-02-23 LAB — CBC WITH DIFFERENTIAL/PLATELET
Basophils Absolute: 0 10*3/uL (ref 0.0–0.2)
Basos: 0 %
EOS (ABSOLUTE): 0.1 10*3/uL (ref 0.0–0.4)
Eos: 1 %
Hematocrit: 39.8 % (ref 34.0–46.6)
Hemoglobin: 13 g/dL (ref 11.1–15.9)
Immature Grans (Abs): 0 10*3/uL (ref 0.0–0.1)
Immature Granulocytes: 0 %
Lymphocytes Absolute: 1.5 10*3/uL (ref 0.7–3.1)
Lymphs: 22 %
MCH: 31.3 pg (ref 26.6–33.0)
MCHC: 32.7 g/dL (ref 31.5–35.7)
MCV: 96 fL (ref 79–97)
Monocytes Absolute: 0.9 10*3/uL (ref 0.1–0.9)
Monocytes: 12 %
Neutrophils Absolute: 4.6 10*3/uL (ref 1.4–7.0)
Neutrophils: 65 %
Platelets: 215 10*3/uL (ref 150–450)
RBC: 4.16 x10E6/uL (ref 3.77–5.28)
RDW: 13 % (ref 11.7–15.4)
WBC: 7 10*3/uL (ref 3.4–10.8)

## 2024-02-23 LAB — TSH+FREE T4
Free T4: 1.29 ng/dL (ref 0.82–1.77)
TSH: 0.781 u[IU]/mL (ref 0.450–4.500)

## 2024-02-28 DIAGNOSIS — E890 Postprocedural hypothyroidism: Secondary | ICD-10-CM | POA: Diagnosis not present

## 2024-02-29 DIAGNOSIS — E890 Postprocedural hypothyroidism: Secondary | ICD-10-CM | POA: Diagnosis not present

## 2024-02-29 DIAGNOSIS — R040 Epistaxis: Secondary | ICD-10-CM | POA: Diagnosis not present

## 2024-04-11 ENCOUNTER — Ambulatory Visit
Admission: RE | Admit: 2024-04-11 | Discharge: 2024-04-11 | Disposition: A | Discharge status: Home / Self Care | Source: Ambulatory Visit | Attending: Internal Medicine | Admitting: Internal Medicine

## 2024-04-11 DIAGNOSIS — Z1231 Encounter for screening mammogram for malignant neoplasm of breast: Secondary | ICD-10-CM | POA: Insufficient documentation

## 2024-05-08 ENCOUNTER — Other Ambulatory Visit: Payer: Self-pay | Admitting: Dermatology

## 2024-05-09 ENCOUNTER — Other Ambulatory Visit: Payer: Self-pay | Admitting: Dermatology

## 2024-05-15 ENCOUNTER — Ambulatory Visit: Payer: Medicare Other | Admitting: Dermatology

## 2024-05-15 ENCOUNTER — Encounter: Payer: Self-pay | Admitting: Dermatology

## 2024-05-15 DIAGNOSIS — L409 Psoriasis, unspecified: Secondary | ICD-10-CM

## 2024-05-15 DIAGNOSIS — L732 Hidradenitis suppurativa: Secondary | ICD-10-CM

## 2024-05-15 DIAGNOSIS — R21 Rash and other nonspecific skin eruption: Secondary | ICD-10-CM

## 2024-05-15 MED ORDER — DESONIDE 0.05 % EX CREA: TOPICAL_CREAM | CUTANEOUS | 2 refills | Status: DC

## 2024-05-15 MED ORDER — KETOCONAZOLE 2 % EX CREA: TOPICAL_CREAM | CUTANEOUS | 2 refills | Status: AC

## 2024-05-15 MED ORDER — METFORMIN HCL ER 500 MG PO TB24: 500.0000 mg | ORAL_TABLET | Freq: Two times a day (BID) | ORAL | 11 refills | Status: DC

## 2024-05-15 NOTE — Progress Notes (Signed)
 Follow-Up Visit   Subjective  Leah Rogers is a 63 y.o. female who presents for the following: yearly HS follow up. Taking Spironolactone  100 mg nightly and Metformin  500 mg twice a day. Tolerating treatment regimen well. Denies side effects or adverse reactions to medications.   C/O irritation at axillae. No changes. Painful, itchy at times.    The following portions of the chart were reviewed this encounter and updated as appropriate: medications, allergies, medical history  Review of Systems:  No other skin or systemic complaints except as noted in HPI or Assessment and Plan.  Objective  Well appearing patient in no apparent distress; mood and affect are within normal limits.  A focused examination was performed of the following areas: Axillae, inframammary, intermammary.   Relevant exam findings are noted in the Assessment and Plan.  Left Axilla Pink hypopigmented macerated patches with surrounding hyperpigmentation in axillae and inguinal creases    Assessment & Plan   HIDRADENITIS SUPPURATIVA Exam: stable hypertrophic scarring of axillae  Chronic and persistent condition with duration or expected duration over one year. Condition is symptomatic/ bothersome to patient. Not currently at goal.   Hidradenitis Suppurativa is a chronic; persistent; non-curable, but treatable condition due to abnormal inflamed sweat glands in the body folds (axilla, inframammary, groin, medial thighs), causing recurrent painful draining cysts and scarring. It can be associated with severe scarring acne and cysts; also abscesses and scarring of scalp. The goal is control and prevention of flares, as it is not curable. Scars are permanent and can be thickened. Treatment may include daily use of topical medication and oral antibiotics.  Oral isotretinoin may also be helpful.  For some cases, Humira or Cosentyx (biologic injections) may be prescribed to decrease the inflammatory process and prevent  flares.  When indicated, inflamed cysts may also be treated surgically.  Treatment Plan:   Reviewed labs from 02/22/2024 Discontinue Spironolactone  due to decreased renal function.  Recheck potassium and renal function with CMP Cont Metformin  XR 500mg  1 po bid Advised pt if flares she can call/ send mychart message for short course of Doxycycline      RASH Left Axilla Start Desonide  cream twice daily to affected skin folds. Do not get if expensive  Start Ketoconazole  2% cream twice daily to affected skin folds.  Skin / nail biopsy - Left Axilla Type of biopsy: punch   Informed consent: discussed and consent obtained   Timeout: patient name, date of birth, surgical site, and procedure verified   Procedure prep:  Patient was prepped and draped in usual sterile fashion Prep type:  Isopropyl alcohol Anesthesia: the lesion was anesthetized in a standard fashion   Anesthetic:  1% lidocaine  w/ epinephrine  1-100,000 buffered w/ 8.4% NaHCO3 Punch size:  3 mm Suture size:  4-0 Suture type: Prolene (polypropylene)   Suture removal (days):  7 Hemostasis achieved with: suture, pressure and aluminum chloride   Outcome: patient tolerated procedure well   Post-procedure details: sterile dressing applied and wound care instructions given   Dressing type: bandage and petrolatum    Specimen 1 - Surgical pathology Differential Diagnosis: inverse psoriasis vs intertrigo vs candidiasis vs familial pemphigus vs cutaneous lupus  Check Margins: No Related Procedures CMP HIDRADENITIS SUPPURATIVA    Return in about 1 week (around 05/22/2024) for Suture Removal.  I, Kate Fought, CMA, am acting as scribe for Boneta Sharps, MD.   Documentation: I have reviewed the above documentation for accuracy and completeness, and I agree with the above.  Collin-Jamal  Claudene, MD

## 2024-05-15 NOTE — Patient Instructions (Addendum)
 For skin folds apply the following creams together twice daily:  Start Desonide  cream twice daily to affected skin folds.  Start Ketoconazole  2% cream twice daily to affected skin folds.    Continue Metformin  500 mg twice daily with meals.  Stop Spironolactone .    Wound Care Instructions  Cleanse wound gently with soap and water  once a day then pat dry with clean gauze. Apply a thin coat of Petrolatum (petroleum jelly, Vaseline) over the wound (unless you have an allergy to this). We recommend that you use a new, sterile tube of Vaseline. Do not pick or remove scabs. Do not remove the yellow or white healing tissue from the base of the wound.  Cover the wound with fresh, clean, nonstick gauze and secure with paper tape. You may use Band-Aids in place of gauze and tape if the wound is small enough, but would recommend trimming much of the tape off as there is often too much. Sometimes Band-Aids can irritate the skin.  You should call the office for your biopsy report after 1 week if you have not already been contacted.  If you experience any problems, such as abnormal amounts of bleeding, swelling, significant bruising, significant pain, or evidence of infection, please call the office immediately.  FOR ADULT SURGERY PATIENTS: If you need something for pain relief you may take 1 extra strength Tylenol  (acetaminophen ) AND 2 Ibuprofen (200mg  each) together every 4 hours as needed for pain. (do not take these if you are allergic to them or if you have a reason you should not take them.) Typically, you may only need pain medication for 1 to 3 days.       Recommend daily broad spectrum sunscreen SPF 30+ to sun-exposed areas, reapply every 2 hours as needed. Call for new or changing lesions.  Staying in the shade or wearing long sleeves, sun glasses (UVA+UVB protection) and wide brim hats (4-inch brim around the entire circumference of the hat) are also recommended for sun protection.

## 2024-05-16 DIAGNOSIS — R21 Rash and other nonspecific skin eruption: Secondary | ICD-10-CM | POA: Diagnosis not present

## 2024-05-17 ENCOUNTER — Other Ambulatory Visit: Payer: Self-pay | Admitting: Dermatology

## 2024-05-17 ENCOUNTER — Other Ambulatory Visit: Payer: Self-pay | Admitting: Internal Medicine

## 2024-05-17 ENCOUNTER — Ambulatory Visit: Payer: Self-pay | Admitting: Dermatology

## 2024-05-17 DIAGNOSIS — E782 Mixed hyperlipidemia: Secondary | ICD-10-CM

## 2024-05-17 LAB — COMPREHENSIVE METABOLIC PANEL WITH GFR
ALT: 18 IU/L (ref 0–32)
AST: 18 IU/L (ref 0–40)
Albumin: 4.3 g/dL (ref 3.9–4.9)
Alkaline Phosphatase: 91 IU/L (ref 44–121)
BUN/Creatinine Ratio: 24 (ref 12–28)
BUN: 23 mg/dL (ref 8–27)
Bilirubin Total: 0.3 mg/dL (ref 0.0–1.2)
CO2: 24 mmol/L (ref 20–29)
Calcium: 9.6 mg/dL (ref 8.7–10.3)
Chloride: 100 mmol/L (ref 96–106)
Creatinine, Ser: 0.96 mg/dL (ref 0.57–1.00)
Globulin, Total: 3.6 g/dL (ref 1.5–4.5)
Glucose: 78 mg/dL (ref 70–99)
Potassium: 4.6 mmol/L (ref 3.5–5.2)
Sodium: 140 mmol/L (ref 134–144)
Total Protein: 7.9 g/dL (ref 6.0–8.5)
eGFR: 66 mL/min/1.73 (ref >59)

## 2024-05-18 LAB — SURGICAL PATHOLOGY

## 2024-05-18 NOTE — Telephone Encounter (Signed)
-----   Message from Emmet sent at 05/17/2024  9:52 PM EDT ----- (Patient will not get MyChart notification) Please call to share that kidney function, liver function, and potassium were normal so patient can restart spironolactone  for hidradenitis suppurativa.  Thank you ----- Message ----- From: Rebecka Memos Lab Results In Sent: 05/17/2024   7:36 AM EDT To: Boneta Sharps, MD

## 2024-05-18 NOTE — Telephone Encounter (Signed)
 Requested Prescriptions  Pending Prescriptions Disp Refills   atorvastatin  (LIPITOR) 80 MG tablet [Pharmacy Med Name: Atorvastatin  Calcium  80 MG Oral Tablet] 90 tablet 3    Sig: Take 1 tablet by mouth once daily     Cardiovascular:  Antilipid - Statins Failed - 05/18/2024  2:56 PM      Failed - Lipid Panel in normal range within the last 12 months    Cholesterol, Total  Date Value Ref Range Status  02/22/2024 124 100 - 199 mg/dL Final   LDL Chol Calc (NIH)  Date Value Ref Range Status  02/22/2024 60 0 - 99 mg/dL Final   HDL  Date Value Ref Range Status  02/22/2024 49 >39 mg/dL Final   Triglycerides  Date Value Ref Range Status  02/22/2024 71 0 - 149 mg/dL Final         Passed - Patient is not pregnant      Passed - Valid encounter within last 12 months    Recent Outpatient Visits           2 months ago Annual physical exam   [facility] at [facility] Leita DEL, MD       Future Appointments             In 9 months Lemon Raisin, MD [facility] at [facility], [phone] [place]

## 2024-05-18 NOTE — Telephone Encounter (Signed)
 Patient advised kidney function, liver function and potassium were all normal and she can restart spironolactone .   Patient said that she was not able to pick up the Desonide  0.05% cream because it was $300.  Lonell RAMAN., RMA

## 2024-05-22 ENCOUNTER — Encounter: Payer: Self-pay | Admitting: Dermatology

## 2024-05-23 ENCOUNTER — Ambulatory Visit (INDEPENDENT_AMBULATORY_CARE_PROVIDER_SITE_OTHER): Admitting: Dermatology

## 2024-05-23 DIAGNOSIS — L408 Other psoriasis: Secondary | ICD-10-CM

## 2024-05-23 DIAGNOSIS — Z79899 Other long term (current) drug therapy: Secondary | ICD-10-CM | POA: Diagnosis not present

## 2024-05-23 DIAGNOSIS — Z4802 Encounter for removal of sutures: Secondary | ICD-10-CM

## 2024-05-23 DIAGNOSIS — L409 Psoriasis, unspecified: Secondary | ICD-10-CM

## 2024-05-23 DIAGNOSIS — Z7189 Other specified counseling: Secondary | ICD-10-CM

## 2024-05-23 MED ORDER — HYDROCORTISONE 2.5 % EX CREA: TOPICAL_CREAM | CUTANEOUS | 2 refills | Status: AC

## 2024-05-23 NOTE — Progress Notes (Unsigned)
   Follow-Up Visit   Subjective  Leah Rogers is a 63 y.o. female who presents for the following: Psoriasis - pt here today to remove sutures and discuss treatment options for bx proven psoriasis of the axillary area.Currently she is using Ketoconazole  2% cream, desonide  was too expensive and she did not fill medication.  The following portions of the chart were reviewed this encounter and updated as appropriate: medications, allergies, medical history  Review of Systems:  No other skin or systemic complaints except as noted in HPI or Assessment and Plan.  Objective  Well appearing patient in no apparent distress; mood and affect are within normal limits.  Areas Examined: The axilla, face, and scalp  Relevant exam findings are noted in the Assessment and Plan.   Assessment & Plan   INVERSE PSORIASIS   Related Medications hydrocortisone  2.5 % cream Apply to aa's psoriasis BID PRN. ENCOUNTER FOR LONG-TERM (CURRENT) USE OF HIGH-RISK MEDICATION   Related Procedures Hepatitis C antibody Hepatitis B surface antigen Hepatitis B surface antibody,qualitative Hepatitis B core antibody, total HIV Antibody (routine testing w rflx) QuantiFERON-TB Gold Plus COUNSELING AND COORDINATION OF CARE   MEDICATION MANAGEMENT   ENCOUNTER FOR REMOVAL OF SUTURES    INVERSE PSORIASIS - Bx proven, with axillary inguinal and scalp involvement, pt also with hx of HS  Pink hypopigmented macerated patches with surrounding hyperpigmentation in axillae and inguinal creases. Erythematous scaly plaques of scalp. 5-10% BSA.  Chronic and persistent condition with duration or expected duration over one year. Condition is bothersome/symptomatic for patient. Currently flared.  Patient c/o joint pain in the knees (L knee replacement)  Treatment Plan:  No sutures present today.   Discussed and recommend Bimzelx , it not covered will try for Cosentyx. Pt states no chronic infections, cancers,  exposure to TB, IBD or Crohn's disease. Pending labs will send in Bimzelx . Will treat psoriasis and HS. May be able to taper off spironolactone  and metformin  for HS  Reviewed risks of biologics including immunosuppression, infections (i.e. TB reactivation), injection site reaction, and failure to improve condition. Goal is control of skin condition, not cure.  Some older biologics such as Humira and Enbrel may slightly increase risk of malignancy and may worsen congestive heart failure.  Taltz, Cosentyx, and Bimzelx  may cause inflammatory bowel disease to flare or may increase incidence of yeast infections. Skyrizi, Tremfya, and Stelara may also slightly increase risk of infection. The use of biologics requires long term medication management, including periodic office visits, annual TB screening test and monitoring of blood work.  D/C Ketoconazole  cream, start HC 2.5% cream apply to aa's BID.   Counseling on psoriasis and coordination of care  psoriasis is a chronic non-curable, but treatable genetic/hereditary disease that may have other systemic features affecting other organ systems such as joints (Psoriatic Arthritis). It is associated with an increased risk of inflammatory bowel disease, heart disease, non-alcoholic fatty liver disease, and depression.  Treatments include light and laser treatments; topical medications; and systemic medications including oral and injectables.    Return in about 3 months (around 08/22/2024) for HS and psoriasis follow up.  [name] Rosina Mayans, CMA, am acting as scribe for Boneta Sharps, MD .   Documentation: I have reviewed the above documentation for accuracy and completeness, and I agree with the above.  Boneta Sharps, MD

## 2024-05-23 NOTE — Patient Instructions (Signed)

## 2024-05-24 ENCOUNTER — Encounter: Payer: Self-pay | Admitting: Dermatology

## 2024-05-24 DIAGNOSIS — Z79899 Other long term (current) drug therapy: Secondary | ICD-10-CM | POA: Insufficient documentation

## 2024-05-24 DIAGNOSIS — L408 Other psoriasis: Secondary | ICD-10-CM | POA: Insufficient documentation

## 2024-05-25 ENCOUNTER — Ambulatory Visit: Payer: Self-pay | Admitting: Dermatology

## 2024-05-25 LAB — QUANTIFERON-TB GOLD PLUS
QuantiFERON Mitogen Value: >10 [IU]/mL
QuantiFERON Nil Value: 0.04 [IU]/mL
QuantiFERON TB1 Ag Value: 0.04 [IU]/mL
QuantiFERON TB2 Ag Value: 0.04 [IU]/mL
QuantiFERON-TB Gold Plus: NEGATIVE

## 2024-05-25 LAB — HEPATITIS B CORE ANTIBODY, TOTAL: Hep B Core Total Ab: NEGATIVE

## 2024-05-25 LAB — HEPATITIS B SURFACE ANTIBODY,QUALITATIVE: Hep B Surface Ab, Qual: NONREACTIVE

## 2024-05-25 LAB — HEPATITIS B SURFACE ANTIGEN: Hepatitis B Surface Ag: NEGATIVE

## 2024-05-25 LAB — HIV ANTIBODY (ROUTINE TESTING W REFLEX): HIV Screen 4th Generation wRfx: NONREACTIVE

## 2024-05-25 LAB — HEPATITIS C ANTIBODY: Hep C Virus Ab: NONREACTIVE

## 2024-05-25 MED ORDER — BIMZELX 320 MG/2ML ~~LOC~~ SOAJ: 320.0000 mg | SUBCUTANEOUS | 0 refills | Status: DC

## 2024-05-25 MED ORDER — BIMZELX 320 MG/2ML ~~LOC~~ SOSY: 320.0000 mg | PREFILLED_SYRINGE | SUBCUTANEOUS | 3 refills | Status: DC

## 2024-07-26 ENCOUNTER — Ambulatory Visit (INDEPENDENT_AMBULATORY_CARE_PROVIDER_SITE_OTHER)

## 2024-07-26 DIAGNOSIS — Z780 Asymptomatic menopausal state: Secondary | ICD-10-CM

## 2024-07-26 DIAGNOSIS — Z0000 Encounter for general adult medical examination without abnormal findings: Secondary | ICD-10-CM | POA: Diagnosis not present

## 2024-07-26 NOTE — Progress Notes (Signed)
 Subjective:   Leah Rogers is a 63 y.o. female who presents for a Medicare Annual Wellness Visit.  Visit Complete: Virtual I connected with this patient by a audio enabled telemedicine application and verified that I am speaking with the correct person using two identifiers.  Patient Location: Home Provider Location: Office/Clinic or Home Office- OFFICE  I discussed the limitations of evaluation and management by telemedicine. The patient expressed understanding and agreed to proceed.  Persons Participating in Visit: Patient   Allergies (verified) Patient has no known allergies.   History: Past Medical History:  Diagnosis Date   Acquired thrombophilia    Anemia    Arthritis    Bilateral carotid artery stenosis    Cancer (HCC)    Thyroid    Heart murmur    Hidradenitis axillaris    History of CVA (cerebrovascular accident) 02/06/2020   02/06/20   Hypothyroidism    Infected sebaceous cyst 10/26/2018   Ovarian cyst    PMB (postmenopausal bleeding)    Pre-diabetes    Stroke (HCC)    weakness in right arm   Thyroid  disease    Past Surgical History:  Procedure Laterality Date   COLONOSCOPY WITH PROPOFOL  N/A 03/20/2016   Procedure: COLONOSCOPY WITH PROPOFOL ;  Surgeon: Rogelia Copping, MD;  Location: [facility];  Service: Endoscopy;  Laterality: N/A;   ENDOMETRIAL BIOPSY  09/22/2011   benign- post menopausal bleeding   HYSTEROSCOPY WITH D & C N/A 06/17/2023   Procedure: DILATATION AND CURETTAGE /HYSTEROSCOPY / POLYPECTOMY;  Surgeon: Verdon Keen, MD;  Location: [facility];  Service: Gynecology;  Laterality: N/A;   INCISION AND DRAINAGE ABSCESS  06/17/2023   Procedure: INCISION AND DRAINAGE ABSCESS;  Surgeon: Verdon Keen, MD;  Location: [facility];  Service: Gynecology;;   REPLACEMENT TOTAL KNEE Left    THYROIDECTOMY N/A 10/14/2015   Procedure: THYROIDECTOMY;  Surgeon: Deward Argue, MD;  Location: [facility];  Service: ENT;  Laterality: N/A;   TUBAL LIGATION      VULVAR LESION REMOVAL Right 06/17/2023   Procedure: VULVAR LESION;  Surgeon: Verdon Keen, MD;  Location: [facility];  Service: Gynecology;  Laterality: Right;   Family History  Problem Relation Age of Onset   Hypertension Mother    Lung cancer Mother    Heart attack Father    Heart attack Brother    Breast cancer Neg Hx    Social History   Occupational History   Not on file  Tobacco Use   Smoking status: Former    Current packs/day: 0.00    Average packs/day: 0.5 packs/day for 18.0 years (9.0 ttl pk-yrs)    Types: Cigarettes    Start date: 03/22/1987    Quit date: 03/21/2005    Years since quitting: 19.3   Smokeless tobacco: Never  Vaping Use   Vaping status: Never Used  Substance and Sexual Activity   Alcohol use: No    Alcohol/week: 0.0 standard drinks of alcohol   Drug use: No   Sexual activity: Yes    Birth control/protection: Other-see comments    Comment: tubal ligation   Tobacco Counseling Counseling given: Not Answered  SDOH Screenings   Depression (PHQ2-9): Low Risk  (02/22/2024)  Tobacco Use: Medium Risk (07/26/2024)   Depression Screen    02/22/2024    9:12 AM 02/22/2024    9:11 AM 02/18/2023    9:25 AM 12/30/2021    9:25 AM 10/20/2021   11:18 AM 12/26/2020    9:20 AM 02/12/2020    3:48 PM  PHQ 2/9 Scores  PHQ - 2 Score 0 0 2 2 0 0 0  PHQ- 9 Score 1  8 6 3 2  0     Goals Addressed             This Visit's Progress    DIET - EAT MORE FRUITS AND VEGETABLES         No data recorded      Objective:    There were no vitals filed for this visit. There is no height or weight on file to calculate BMI.  Current Medications (verified) Outpatient Encounter Medications as of 07/26/2024  Medication Sig   atorvastatin  (LIPITOR) 80 MG tablet Take 1 tablet by mouth once daily   Bimekizumab -bkzx (BIMZELX ) 320 MG/2ML SOSY Inject 320 mg into the skin every 8 (eight) weeks. Inject 320 mg subcutaneously every 8 weeks. Maintenance dose   calcium  gluconate 500 MG  tablet Take 2 tablets by mouth daily.   cholecalciferol (VITAMIN D3) 25 MCG (1000 UNIT) tablet Take 1,000 Units by mouth daily.   clopidogrel  (PLAVIX ) 75 MG tablet Take 1 tablet (75 mg total) by mouth daily.   desonide  (DESOWEN ) 0.05 % cream Apply twice daily to affected skin folds   EQ ASPIRIN  ADULT LOW DOSE 81 MG EC tablet Take 81 mg by mouth daily.   ferrous sulfate  325 (65 FE) MG tablet Take 325 mg by mouth daily with breakfast.   hydrocortisone  2.5 % cream Apply to aa's psoriasis BID PRN.   ketoconazole  (NIZORAL ) 2 % cream Apply to affected skin folds twice daily.   levothyroxine  (SYNTHROID ) 112 MCG tablet Take 112 mcg by mouth daily before breakfast.   metFORMIN  (GLUCOPHAGE -XR) 500 MG 24 hr tablet Take 1 tablet (500 mg total) by mouth 2 (two) times daily with a meal. Patient takes this for Hidradenitis suppurativa   bimekizumab -bkzx (BIMZELX ) 320 MG/2ML pen Inject 2 mLs (320 mg total) into the skin every 28 (twenty-eight) days for 5 doses. Inject 2 mL (320 mg) subcutaneously once every 4 weeks for the first 16 weeks. Loading Dose.   spironolactone  (ALDACTONE ) 100 MG tablet Take 1 tablet (100 mg total) by mouth at bedtime. (Patient not taking: Reported on 07/26/2024)   No facility-administered encounter medications on file as of 07/26/2024.   Hearing/Vision screen No results found. Immunizations and Health Maintenance Health Maintenance  Topic Date Due   Zoster Vaccines- Shingrix (1 of 2) Never done   Pneumococcal Vaccine: 50+ Years (1 of 1 - PCV) Never done   Influenza Vaccine  04/21/2024   COVID-19 Vaccine (4 - 2025-26 season) 05/22/2024   DEXA SCAN  Never done   Mammogram  04/11/2025   Medicare Annual Wellness (AWV)  07/26/2025   Colonoscopy  03/20/2026   Cervical Cancer Screening (HPV/Pap Cotest)  12/31/2026   Hepatitis C Screening  Completed   HIV Screening  Completed   Hepatitis B Vaccines 19-59 Average Risk  Aged Out   Meningococcal B Vaccine  Aged Out   DTaP/Tdap/Td   Discontinued        Assessment/Plan:  This is a routine wellness examination for Leah Rogers.  Patient Care Team: Justus Leita DEL, MD as PCP - General (Internal Medicine) Justus Leita DEL, MD as PCP - Internal Medicine (Internal Medicine) Edda Mt, MD (Otolaryngology) Cindie Jesusa HERO, RN as Registered Nurse Dannielle Arlean FALCON, RN (Inactive) as Registered Nurse Babara Call, MD as Consulting Physician (Oncology)  I have personally reviewed and noted the following in the patient's chart:   Medical and  social history Use of alcohol, tobacco or illicit drugs  Current medications and supplements including opioid prescriptions. Functional ability and status Nutritional status Physical activity Advanced directives List of other physicians Hospitalizations, surgeries, and ER visits in previous 12 months Vitals Screenings to include cognitive, depression, and falls Referrals and appointments  No orders of the defined types were placed in this encounter.  In addition, I have reviewed and discussed with patient certain preventive protocols, quality metrics, and best practice recommendations. A written personalized care plan for preventive services as well as general preventive health recommendations were provided to patient.   Jhonnie GORMAN Das, LPN   88/12/7972   Return in 1 year (on 07/26/2025).  After Visit Summary: (MyChart) Due to this being a telephonic visit, the after visit summary with patients personalized plan was offered to patient via MyChart   Nurse Notes: DEXA REFERRAL SENT

## 2024-07-26 NOTE — Patient Instructions (Addendum)
 Leah Rogers,  Thank you for taking the time for your Medicare Wellness Visit. I appreciate your continued commitment to your health goals. Please review the care plan we discussed, and feel free to reach out if I can assist you further.  Please note that Annual Wellness Visits do not include a physical exam. Some assessments may be limited, especially if the visit was conducted virtually. If needed, we may recommend an in-person follow-up with your provider.  Ongoing Care Seeing your primary care provider every 3 to 6 months helps us  monitor your health and provide consistent, personalized care.   Referrals If a referral was made during today's visit and you haven't received any updates within two weeks, please contact the referred provider directly to check on the status.  REFERRAL SENT FOR BONE DENSITY SCAN   Complete the pre-visit questions and select the time and place that best fits your schedule. You have an order for:  []   2D Mammogram  []   3D Mammogram  [x]   Bone Density     Please call for appointment:  [facility]  [address] [place] [address] [phone] [facility] [address] [place], [place] [address] [phone] [facility] [address] [place], [place] [address] [phone]   Make sure to wear two-piece clothing.  No lotions, powders, or deodorants the day of the appointment. Make sure to bring picture ID and insurance card.  Bring list of medications you are currently taking including any supplements.   Schedule your [place] screening mammogram through MyChart!   Log into your MyChart account.  Go to 'Visit' (or 'Appointments' if on mobile App) --> Schedule an Appointment  Under 'Select a Reason for Visit' choose the Mammogram Screening option.  Complete the pre-visit questions and select the time and place that  best fits your schedule.   Recommended Screenings:  Health Maintenance  Topic Date Due   Zoster (Shingles) Vaccine (1 of 2) Never done   Pneumococcal Vaccine for age over 54 (1 of 1 - PCV) Never done   Flu Shot  04/21/2024   COVID-19 Vaccine (4 - 2025-26 season) 05/22/2024   DEXA scan (bone density measurement)  Never done   Breast Cancer Screening  04/11/2025   Medicare Annual Wellness Visit  07/26/2025   Colon Cancer Screening  03/20/2026   Pap with HPV screening  12/31/2026   Hepatitis C Screening  Completed   HIV Screening  Completed   Hepatitis B Vaccine  Aged Out   Meningitis B Vaccine  Aged Out   DTaP/Tdap/Td vaccine  Discontinued    Vision: Annual vision screenings are recommended for early detection of glaucoma, cataracts, and diabetic retinopathy. These exams can also reveal signs of chronic conditions such as diabetes and high blood pressure.  Dental: Annual dental screenings help detect early signs of oral cancer, gum disease, and other conditions linked to overall health, including heart disease and diabetes.  Please see the attached documents for additional preventive care recommendations.   NEXT AWV  08/01/25 @ 10:50 AM BY VIDEO

## 2024-08-22 ENCOUNTER — Other Ambulatory Visit: Payer: Self-pay | Admitting: Orthopedic Surgery

## 2024-08-22 ENCOUNTER — Ambulatory Visit (INDEPENDENT_AMBULATORY_CARE_PROVIDER_SITE_OTHER): Admitting: Dermatology

## 2024-08-22 DIAGNOSIS — L732 Hidradenitis suppurativa: Secondary | ICD-10-CM

## 2024-08-22 DIAGNOSIS — Z7189 Other specified counseling: Secondary | ICD-10-CM

## 2024-08-22 DIAGNOSIS — R21 Rash and other nonspecific skin eruption: Secondary | ICD-10-CM | POA: Diagnosis not present

## 2024-08-22 DIAGNOSIS — L408 Other psoriasis: Secondary | ICD-10-CM | POA: Diagnosis not present

## 2024-08-22 DIAGNOSIS — L308 Other specified dermatitis: Secondary | ICD-10-CM

## 2024-08-22 DIAGNOSIS — Z79899 Other long term (current) drug therapy: Secondary | ICD-10-CM

## 2024-08-22 MED ORDER — TRIAMCINOLONE ACETONIDE 0.1 % EX CREA: TOPICAL_CREAM | CUTANEOUS | 1 refills | Status: AC

## 2024-08-22 MED ORDER — TERBINAFINE HCL 250 MG PO TABS: 250.0000 mg | ORAL_TABLET | Freq: Every day | ORAL | 0 refills | Status: DC

## 2024-08-22 NOTE — Progress Notes (Unsigned)
 Follow-Up Visit   Subjective  ELLICE Rogers is a 63 y.o. female who presents for the following: 3 month psoriasis follow up.  She has been taking Bimzelx  and states she has developed knots on the back of her neck and has concerns it could be the medication because it developed after treatment.  Patient states her scalp has been feeling sweaty consistently and has been taking a break from her regular hair routine due to the complication.   The following portions of the chart were reviewed this encounter and updated as appropriate: medications, allergies, medical history  Review of Systems:  No other skin or systemic complaints except as noted in HPI or Assessment and Plan.  Objective  Well appearing patient in no apparent distress; mood and affect are within normal limits.  A focused examination was performed of the following areas: Bilateral axilla, lower abdomen, neck, ears, scalp  Relevant exam findings are noted in the Assessment and Plan.    Assessment & Plan   INVERSE PSORIASIS - Bx proven, with axillary inguinal and scalp involvement, pt also with hx of HS   Pink hypopigmented macerated patches with surrounding hyperpigmentation in axillae and inguinal creases. Erythematous scaly plaques of scalp. 5-10% BSA.   Chronic and persistent condition with duration or expected duration over one year. Condition is bothersome/symptomatic for patient. Currently flared.   Patient c/o joint pain in the knees (L knee replacement)   Treatment Plan: Recommend holding off Bimzelx .     Reviewed risks of biologics including immunosuppression, infections (i.e. TB reactivation), injection site reaction, and failure to improve condition. Goal is control of skin condition, not cure.  Some older biologics such as Humira and Enbrel may slightly increase risk of malignancy and may worsen congestive heart failure.  Taltz, Cosentyx, and Bimzelx  may cause inflammatory bowel disease to flare or  may increase incidence of yeast infections. Skyrizi, Tremfya, and Stelara may also slightly increase risk of infection. The use of biologics requires long term medication management, including periodic office visits, annual TB screening test and monitoring of blood work.   Counseling on psoriasis and coordination of care  psoriasis is a chronic non-curable, but treatable genetic/hereditary disease that may have other systemic features affecting other organ systems such as joints (Psoriatic Arthritis). It is associated with an increased risk of inflammatory bowel disease, heart disease, non-alcoholic fatty liver disease, and depression.  Treatments include light and laser treatments; topical medications; and systemic medications including oral and injectables.  Rash Exam: ***  Differential diagnosis:  Psoriasis vs tinea capitis vs atopic dermatitis   Treatment Plan: To confirm punch biopsy obtained today.  Start terbinafine once po daily (250 mg total)  Start triamcinolone  0.1% cream twice a day as needed to affected areas until skin smooth. Avoid applying to face, groin, and axilla. Use as directed. Long-term use can cause thinning of the skin.  Terbinafine Counseling  Terbinafine is an anti-fungal medicine that can be applied to the skin (over the counter) or taken by mouth (prescription) to treat fungal infections. The pill version is often used to treat fungal infections of the nails or scalp. While most people do not have any side effects from taking terbinafine pills, some possible side effects of the medicine can include taste changes, headache, loss of smell, vision changes, nausea, vomiting, or diarrhea.   Rare side effects can include irritation of the liver, allergic reaction, or decrease in blood counts (which may show up as not feeling well or developing an  infection). If you are concerned about any of these side effects, please stop the medicine and call your doctor, or in the  case of an emergency such as feeling very unwell, seek immediate medical care.   Topical steroids (such as triamcinolone , fluocinolone, fluocinonide, mometasone, clobetasol, halobetasol, betamethasone, hydrocortisone ) can cause thinning and lightening of the skin if they are used for too long in the same area. Your physician has selected the right strength medicine for your problem and area affected on the body. Please use your medication only as directed by your physician to prevent side effects.   RASH AND OTHER NONSPECIFIC SKIN ERUPTION Scalp Skin / nail biopsy - Scalp Type of biopsy: punch   Informed consent: discussed and consent obtained   Timeout: patient name, date of birth, surgical site, and procedure verified   Procedure prep:  Patient was prepped and draped in usual sterile fashion Prep type:  Isopropyl alcohol Anesthesia: the lesion was anesthetized in a standard fashion   Anesthetic:  1% lidocaine  w/ epinephrine  1-100,000 buffered w/ 8.4% NaHCO3 Punch size:  3 mm Suture size:  4-0 Suture type: Prolene (polypropylene)   Suture removal (days):  7 Hemostasis achieved with: suture, pressure and aluminum chloride   Outcome: patient tolerated procedure well   Post-procedure details: sterile dressing applied and wound care instructions given   Dressing type: bandage and petrolatum    Specimen 1 - Surgical pathology Differential Diagnosis: psoriasis vs tinea capitis vs atopic dermatitis   Check Margins: No Related Procedures Culture, Fungus with Smear Anaerobic and Aerobic Culture  Return in about 1 week (around 08/29/2024) for suture removal , w/ Dr. Claudene.  IAlmetta Nora, RMA, am acting as scribe for Boneta Claudene, MD .   Documentation: I have reviewed the above documentation for accuracy and completeness, and I agree with the above.  Boneta Claudene, MD

## 2024-08-22 NOTE — Patient Instructions (Addendum)
 Biopsy Wound Care Instructions  Leave the original bandage on for 24 hours if possible.  If the bandage becomes soaked or soiled before that time, it is OK to remove it and examine the wound.  A small amount of post-operative bleeding is normal.  If excessive bleeding occurs, remove the bandage, place gauze over the site and apply continuous pressure (no peeking) over the area for 30 minutes. If this does not work, please call our clinic as soon as possible or page your doctor if it is after hours.   Once a day, cleanse the wound with soap and water. It is fine to shower. If a thick crust develops you may use a Q-tip dipped into dilute hydrogen peroxide (mix 1:1 with water) to dissolve it.  Hydrogen peroxide can slow the healing process, so use it only as needed.    After washing, apply petroleum jelly (Vaseline) or an antibiotic ointment if your doctor prescribed one for you, followed by a bandage.    For best healing, the wound should be covered with a layer of ointment at all times. If you are not able to keep the area covered with a bandage to hold the ointment in place, this may mean re-applying the ointment several times a day.  Continue this wound care until the wound has healed and is no longer open.   Itching and mild discomfort is normal during the healing process. However, if you develop pain or severe itching, please call our office.   If you have any discomfort, you can take Tylenol  (acetaminophen ) or ibuprofen as directed on the bottle. (Please do not take these if you have an allergy to them or cannot take them for another reason).  Some redness, tenderness and white or yellow material in the wound is normal healing.  If the area becomes very sore and red, or develops a thick yellow-green material (pus), it may be infected; please notify us .    If you have stitches, return to clinic as directed to have the stitches removed. You will continue wound care for 2-3 days after the stitches  are removed.   Wound healing continues for up to one year following surgery. It is not unusual to experience pain in the scar from time to time during the interval.  If the pain becomes severe or the scar thickens, you should notify the office.    A slight amount of redness in a scar is expected for the first six months.  After six months, the redness will fade and the scar will soften and fade.  The color difference becomes less noticeable with time.  If there are any problems, return for a post-op surgery check at your earliest convenience.  To improve the appearance of the scar, you can use silicone scar gel, cream, or sheets (such as Mederma or Serica) every night for up to one year. These are available over the counter (without a prescription).  Please call our office at [phone] for any questions or concerns.      Due to recent changes in healthcare laws, you may see results of your pathology and/or laboratory studies on MyChart before the doctors have had a chance to review them. We understand that in some cases there may be results that are confusing or concerning to you. Please understand that not all results are received at the same time and often the doctors may need to interpret multiple results in order to provide you with the best plan of care or  course of treatment. Therefore, we ask that you please give us  2 business days to thoroughly review all your results before contacting the office for clarification. Should we see a critical lab result, you will be contacted sooner.   If You Need Anything After Your Visit  If you have any questions or concerns for your doctor, please call our main line at [phone] and press option 4 to reach your doctor's medical assistant. If no one answers, please leave a voicemail as directed and we will return your call as soon as possible. Messages left after 4 pm will be answered the following business day.   You may also send us  a message  via MyChart. We typically respond to MyChart messages within 1-2 business days.  For prescription refills, please ask your pharmacy to contact our office. Our fax number is [phone].  If you have an urgent issue when the clinic is closed that cannot wait until the next business day, you can page your doctor at the number below.    Please note that while we do our best to be available for urgent issues outside of office hours, we are not available 24/7.   If you have an urgent issue and are unable to reach us , you may choose to seek medical care at your doctor's office, retail clinic, urgent care center, or emergency room.  If you have a medical emergency, please immediately call 911 or go to the emergency department.  Pager Numbers  - Dr. Hester: [phone]  - Dr. Jackquline: [phone]  - Dr. Claudene: [phone]   - Dr. Raymund: [phone]  In the event of inclement weather, please call our main line at [phone] for an update on the status of any delays or closures.  Dermatology Medication Tips: Please keep the boxes that topical medications come in in order to help keep track of the instructions about where and how to use these. Pharmacies typically print the medication instructions only on the boxes and not directly on the medication tubes.   If your medication is too expensive, please contact our office at [phone] option 4 or send us  a message through MyChart.   We are unable to tell what your co-pay for medications will be in advance as this is different depending on your insurance coverage. However, we may be able to find a substitute medication at lower cost or fill out paperwork to get insurance to cover a needed medication.   If a prior authorization is required to get your medication covered by your insurance company, please allow us  1-2 business days to complete this process.  Drug prices often vary depending on where the prescription is filled and some  pharmacies may offer cheaper prices.  The website www.goodrx.com contains coupons for medications through different pharmacies. The prices here do not account for what the cost may be with help from insurance (it may be cheaper with your insurance), but the website can give you the price if you did not use any insurance.  - You can print the associated coupon and take it with your prescription to the pharmacy.  - You may also stop by our office during regular business hours and pick up a GoodRx coupon card.  - If you need your prescription sent electronically to a different pharmacy, notify our office through [facility] or by phone at [phone] option 4.     Si Usted Necesita Algo Despus de Su Visita  Tambin puede enviarnos un mensaje a travs de  MyChart. Por lo general respondemos a los mensajes de MyChart en el transcurso de 1 a 2 das hbiles.  Para renovar recetas, por favor pida a su farmacia que se ponga en contacto con nuestra oficina. Randi lakes de fax es [place] [phone].  Si tiene un asunto urgente cuando la clnica est cerrada y que no puede esperar hasta el siguiente da hbil, puede llamar/localizar a su doctor(a) al nmero que aparece a continuacin.   Por favor, tenga en cuenta que aunque hacemos todo lo posible para estar disponibles para asuntos urgentes fuera del horario de [place], no estamos disponibles las 24 horas del da, los 7 [address]  [address] de la [place].   Si tiene un problema urgente y no puede comunicarse con nosotros, puede optar por buscar atencin mdica  en el consultorio de su doctor(a), en una clnica privada, en un centro de atencin urgente o en una sala de emergencias.  Si tiene Engineer, drilling, por favor llame inmediatamente al 911 o vaya a la sala de emergencias.  Nmeros de bper  - Dr. Hester: [phone]  - Dra. Jackquline: [phone]  - Dr. Claudene: [phone]  - Dra. Kitts: [phone]  En caso de inclemencias del [place],  por favor llame a nuestra lnea principal al [phone] para una actualizacin sobre el estado de cualquier retraso o cierre.  Consejos para la medicacin en dermatologa: Por favor, guarde las cajas en las que vienen los medicamentos de uso tpico para ayudarle a seguir las instrucciones sobre dnde y cmo usarlos. Las farmacias generalmente imprimen las instrucciones del medicamento slo en las cajas y no directamente en los tubos del [place].   Si su medicamento es muy caro, por favor, pngase en contacto con landry rieger llamando al [phone] y presione la opcin 4 o envenos un mensaje a travs de Clinical cytogeneticist.   No podemos decirle cul ser su copago por los medicamentos por adelantado ya que esto es diferente dependiendo de la cobertura de su seguro. Sin embargo, es posible que podamos encontrar un medicamento sustituto a Audiological scientist un formulario para que el seguro cubra el medicamento que se considera necesario.   Si se requiere una autorizacin previa para que su compaa de seguros malta su medicamento, por favor permtanos de 1 a 2 das hbiles para completar este proceso.  Los precios de los medicamentos varan con frecuencia dependiendo del Environmental consultant de dnde se surte la receta y alguna farmacias pueden ofrecer precios ms baratos.  El sitio web www.goodrx.com tiene cupones para medicamentos de Health and safety inspector. Los precios aqu no tienen en cuenta lo que podra costar con la ayuda del seguro (puede ser ms barato con su seguro), pero el sitio web puede darle el precio si no utiliz Tourist information centre manager.  - Puede imprimir el cupn correspondiente y llevarlo con su receta a la farmacia.  - Tambin puede pasar por nuestra oficina durante el horario de atencin regular y Education officer, museum una tarjeta de cupones de GoodRx.  - Si necesita que su receta se enve electrnicamente a una farmacia diferente, informe a nuestra oficina a travs de MyChart de [place] o por telfono llamando al  [phone] y presione la opcin 4.

## 2024-08-24 ENCOUNTER — Encounter: Payer: Self-pay | Admitting: Dermatology

## 2024-08-28 LAB — SURGICAL PATHOLOGY

## 2024-08-29 ENCOUNTER — Ambulatory Visit: Admitting: Dermatology

## 2024-08-29 MED ORDER — FLUCONAZOLE 150 MG PO TABS: 150.0000 mg | ORAL_TABLET | Freq: Every day | ORAL | 0 refills | Status: AC

## 2024-08-29 MED ORDER — CLOBETASOL PROPIONATE 0.05 % EX SOLN: CUTANEOUS | 1 refills | Status: AC

## 2024-08-29 NOTE — Progress Notes (Unsigned)
   Follow-Up Visit   Subjective  Leah Rogers is a 63 y.o. female who presents for the following: Suture removal. Scalp is still sore. Patient still currently using triamcinolone  0.1% cream.  Pathology showed PSORIASIFORM DERMATITIS   The following portions of the chart were reviewed this encounter and updated as appropriate: medications, allergies, medical history  Review of Systems:  No other skin or systemic complaints except as noted in HPI or Assessment and Plan.  Objective  Well appearing patient in no apparent distress; mood and affect are within normal limits.  Areas Examined: Scalp, neck  Relevant physical exam findings are noted in the Assessment and Plan.        Assessment & Plan    Encounter for Removal of Sutures - Incision site is clean, dry and intact. - Suture left in place to be removed at one week follow-up.  - Discussed pathology results showing PSORIASIFORM  DERMATITIS  - Patient advised to call with any concerns or if they notice any new or changing lesions.  NVERSE PSORIASIS - Bx proven, with axillary inguinal and scalp involvement Hidradenitis suppurativa Exam: stable hypertrophic scarring of axillae. Clearance of psoriatic lesions in axillae and infrapannus. 0% BSA.   Chronic and persistent condition with duration or expected duration over one year. Condition is bothersome/symptomatic for patient. Currently well-controlled.   Patient c/o joint pain in the knees (L knee replacement)   Treatment Plan: - Start clobetasol  solution 0.05% twice daily to affected skin, scalp.  - Discussed side effect of super potent topical steroids including atrophy, dyspigmentation, striae, telangectasia, folliculitis, loss of skin pigment, hair growth, tachyphylaxis, risk of systemic absorption with missuse. - Start fluconazole  150 mg po daily x 7 days.   Side effects of fluconazole  (diflucan ) include nausea, diarrhea, headache, dizziness, taste changes, rare  risk of irritation of the liver, allergy, or decreased blood counts (which could show up as infection or tiredness).  Counseling on psoriasis and coordination of care  psoriasis is a chronic non-curable, but treatable genetic/hereditary disease that may have other systemic features affecting other organ systems such as joints (Psoriatic Arthritis). It is associated with an increased risk of inflammatory bowel disease, heart disease, non-alcoholic fatty liver disease, and depression.  Treatments include light and laser treatments; topical medications; and systemic medications including oral and injectables.    Return in about 1 week (around 09/05/2024) for suture removal/rash f/u w/ Dr. Claudene.  IAlmetta Nora, RMA, am acting as scribe for Boneta Claudene, MD .   Documentation: I have reviewed the above documentation for accuracy and completeness, and I agree with the above.  Boneta Claudene, MD

## 2024-08-29 NOTE — Patient Instructions (Signed)

## 2024-08-30 ENCOUNTER — Telehealth: Payer: Self-pay | Admitting: Dermatology

## 2024-08-30 ENCOUNTER — Encounter: Payer: Self-pay | Admitting: Dermatology

## 2024-08-30 ENCOUNTER — Ambulatory Visit: Payer: Self-pay | Admitting: Dermatology

## 2024-08-30 MED ORDER — KETOCONAZOLE 2 % EX SHAM: 1.0000 | MEDICATED_SHAMPOO | Freq: Every day | CUTANEOUS | 11 refills | Status: DC

## 2024-08-30 NOTE — Telephone Encounter (Signed)
 Sent keto shampoo for daily use. Use before applying and leaving in clobetasol . Called patient about new prescription. All questions answered.

## 2024-08-31 ENCOUNTER — Other Ambulatory Visit: Payer: Self-pay

## 2024-08-31 ENCOUNTER — Inpatient Hospital Stay: Admission: RE | Admit: 2024-08-31 | Discharge: 2024-08-31 | Attending: Orthopedic Surgery

## 2024-08-31 VITALS — BP 104/93 | HR 95 | Temp 99.2°F | Resp 16 | Ht 65.0 in | Wt 228.8 lb

## 2024-08-31 DIAGNOSIS — Z01818 Encounter for other preprocedural examination: Secondary | ICD-10-CM

## 2024-08-31 HISTORY — DX: Long term (current) use of anticoagulants: Z79.01

## 2024-08-31 HISTORY — DX: Bilateral primary osteoarthritis of knee: M17.0

## 2024-08-31 HISTORY — DX: Personal history of malignant neoplasm of thyroid: Z85.850

## 2024-08-31 HISTORY — DX: Other psoriasis: L40.8

## 2024-08-31 HISTORY — DX: Mixed hyperlipidemia: E78.2

## 2024-08-31 LAB — URINALYSIS, ROUTINE W REFLEX MICROSCOPIC
Bilirubin Urine: NEGATIVE
Glucose, UA: NEGATIVE mg/dL
Ketones, ur: NEGATIVE mg/dL
Nitrite: NEGATIVE
Protein, ur: 30 mg/dL — AB
Specific Gravity, Urine: 1.017 (ref 1.005–1.030)
WBC, UA: >50 WBC/hpf (ref 0–5)
pH: 5 (ref 5.0–8.0)

## 2024-08-31 LAB — COMPREHENSIVE METABOLIC PANEL WITH GFR
ALT: 47 U/L — ABNORMAL HIGH (ref 0–44)
AST: 37 U/L (ref 15–41)
Albumin: 3.9 g/dL (ref 3.5–5.0)
Alkaline Phosphatase: 99 U/L (ref 38–126)
Anion gap: 13 (ref 5–15)
BUN: 22 mg/dL (ref 8–23)
CO2: 27 mmol/L (ref 22–32)
Calcium: 9.2 mg/dL (ref 8.9–10.3)
Chloride: 101 mmol/L (ref 98–111)
Creatinine, Ser: 0.99 mg/dL (ref 0.44–1.00)
GFR, Estimated: >60 mL/min (ref >60)
Glucose, Bld: 97 mg/dL (ref 70–99)
Potassium: 3.7 mmol/L (ref 3.5–5.1)
Sodium: 141 mmol/L (ref 135–145)
Total Bilirubin: 0.4 mg/dL (ref 0.0–1.2)
Total Protein: 8.1 g/dL (ref 6.5–8.1)

## 2024-08-31 LAB — CBC WITH DIFFERENTIAL/PLATELET
Abs Immature Granulocytes: 0.03 K/uL (ref 0.00–0.07)
Basophils Absolute: 0 K/uL (ref 0.0–0.1)
Basophils Relative: 0 %
Eosinophils Absolute: 0.1 K/uL (ref 0.0–0.5)
Eosinophils Relative: 1 %
HCT: 39.1 % (ref 36.0–46.0)
Hemoglobin: 12.9 g/dL (ref 12.0–15.0)
Immature Granulocytes: 0 %
Lymphocytes Relative: 14 %
Lymphs Abs: 1.2 K/uL (ref 0.7–4.0)
MCH: 30.8 pg (ref 26.0–34.0)
MCHC: 33 g/dL (ref 30.0–36.0)
MCV: 93.3 fL (ref 80.0–100.0)
Monocytes Absolute: 1.5 K/uL — ABNORMAL HIGH (ref 0.1–1.0)
Monocytes Relative: 18 %
Neutro Abs: 5.5 K/uL (ref 1.7–7.7)
Neutrophils Relative %: 67 %
Platelets: 239 K/uL (ref 150–400)
RBC: 4.19 MIL/uL (ref 3.87–5.11)
RDW: 13.7 % (ref 11.5–15.5)
WBC: 8.3 K/uL (ref 4.0–10.5)
nRBC: 0 % (ref 0.0–0.2)

## 2024-08-31 LAB — SURGICAL PCR SCREEN
MRSA, PCR: NEGATIVE
Staphylococcus aureus: POSITIVE — AB

## 2024-08-31 NOTE — Progress Notes (Signed)
 Pt came in to PAT for anesthesia interview-Pt has cold and states she has had x 3days-Pt with non productive cough, runny nose and temp of 99.2 in office today. Told pt to call PCP Elden) today and let her know of upcoming TKA and to see if maybe she can be seen in office to avoid cancellation of surgery. Pt verbalized understanding

## 2024-08-31 NOTE — Patient Instructions (Signed)
 Your procedure is scheduled on:09-11-24 Monday Report to the Registration Desk on the 1st floor of the [facility].Then proceed to the 2nd floor Surgery Desk To find out your arrival time, please call [phone] between 1PM - 3PM on:09-08-24 Friday If your arrival time is 6:00 am, do not arrive before that time as the [facility] entrance doors do not open until 6:00 am.  REMEMBER: Instructions that are not followed completely may result in serious medical risk, up to and including death; or upon the discretion of your surgeon and anesthesiologist your surgery may need to be rescheduled.  Do not eat food after midnight the night before surgery.  No gum chewing or hard candies.  You may however, drink CLEAR liquids up to 2 hours before you are scheduled to arrive for your surgery. Do not drink anything within 2 hours of your scheduled arrival time.  Clear liquids include: - water   - apple juice without pulp - gatorade (not RED colors) - black coffee or tea (Do NOT add milk or creamers to the coffee or tea) Do NOT drink anything that is not on this list.  In addition, your doctor has ordered for you to drink the provided:  Ensure Pre-Surgery Clear Carbohydrate Drink  Drinking this carbohydrate drink up to two hours before surgery helps to reduce insulin resistance and improve patient outcomes. Please complete drinking 2 hours before scheduled arrival time.  One week prior to surgery:Last dose will be on 09-03-24 Sunday Stop Anti-inflammatories (NSAIDS) such as Advil, Aleve, Ibuprofen, Motrin, Naproxen, Naprosyn and Aspirin  based products such as Excedrin, Goody's Powder, BC Powder. Stop ANY OVER THE COUNTER supplements until after surgery (Calcium , Vitamin D3, Ferrous Sulfate )  You may however, continue to take Tylenol  if needed for pain up until the day of surgery.  Stop clopidogrel  (PLAVIX ) 5 days prior to surgery-Last dose will be on 09-05-24 Tuesday  Stop metFORMIN   (GLUCOPHAGE -XR) 2 days prior to surgery-Last dose will be on 09-08-24 Friday  Continue taking all of your other prescription medications up until the day of surgery.  ON THE DAY OF SURGERY ONLY TAKE THESE MEDICATIONS WITH SIPS OF WATER : -levothyroxine  (SYNTHROID )   Continue your 81 mg Aspirin  up until the day prior to surgery-Do NOT take the day of surgery  No Alcohol for 24 hours before or after surgery.  No Smoking including e-cigarettes for 24 hours before surgery.  No chewable tobacco products for at least 6 hours before surgery.  No nicotine patches on the day of surgery.  Do not use any recreational drugs for at least a week (preferably 2 weeks) before your surgery.  Please be advised that the combination of cocaine and anesthesia may have negative outcomes, up to and including death. If you test positive for cocaine, your surgery will be cancelled.  On the morning of surgery brush your teeth with toothpaste and water , you may rinse your mouth with mouthwash if you wish. Do not swallow any toothpaste or mouthwash.  Use CHG Soap as directed on instruction sheet.  Do not wear jewelry, make-up, hairpins, clips or nail polish.  For welded (permanent) jewelry: bracelets, anklets, waist bands, etc.  Please have this removed prior to surgery.  If it is not removed, there is a chance that hospital personnel will need to cut it off on the day of surgery.  Do not wear lotions, powders, or perfumes.   Do not shave body hair from the neck down 48 hours before surgery.  Contact lenses, hearing  aids and dentures may not be worn into surgery.  Do not bring valuables to the hospital. [facility] is not responsible for any missing/lost belongings or valuables.    Notify your doctor if there is any change in your medical condition (cold, fever, infection).  Wear comfortable clothing (specific to your surgery type) to the hospital.  After surgery, you can help prevent lung complications  by doing breathing exercises.  Take deep breaths and cough every 1-2 hours. Your doctor may order a device called an Incentive Spirometer to help you take deep breaths. When coughing or sneezing, hold a pillow firmly against your incision with both hands. This is called splinting. Doing this helps protect your incision. It also decreases belly discomfort.  If you are being admitted to the hospital overnight, leave your suitcase in the car. After surgery it may be brought to your room.  In case of increased patient census, it may be necessary for you, the patient, to continue your postoperative care in the Same Day Surgery department.  If you are being discharged the day of surgery, you will not be allowed to drive home. You will need a responsible individual to drive you home and stay with you for 24 hours after surgery.   If you are taking public transportation, you will need to have a responsible individual with you.  Please call the Pre-admissions Testing Dept. at [phone] if you have any questions about these instructions.  Surgery Visitation Policy:  Patients having surgery or a procedure may have two visitors.  Children under the age of 17 must have an adult with them who is not the patient.  Inpatient Visitation:    Visiting hours are 7 a.m. to 8 p.m. Up to four visitors are allowed at one time in a patient room. The visitors may rotate out with other people during the day.  One visitor age 60 or older may stay with the patient overnight and must be in the room by 8 p.m.    Pre-operative 4 CHG Bath Instructions   You can play a key role in reducing the risk of infection after surgery. Your skin needs to be as free of germs as possible. You can reduce the number of germs on your skin by washing with CHG (chlorhexidine  gluconate) soap before surgery. CHG is an antiseptic soap that kills germs and continues to kill germs even after washing.   DO NOT use if you have an  allergy to chlorhexidine /CHG or antibacterial soaps. If your skin becomes reddened or irritated, stop using the CHG and notify one of our RNs at [phone].   Please shower with the CHG soap starting 4 days before surgery using the following schedule:     Please keep in mind the following:  DO NOT shave, including legs and underarms, starting the day of your first shower.   You may shave your face at any point before/day of surgery.  Place clean sheets on your bed the day you start using CHG soap. Use a clean washcloth (not used since being washed) for each shower. DO NOT sleep with pets once you start using the CHG.   CHG Shower Instructions:  If you choose to wash your hair and private area, wash first with your normal shampoo/soap.  After you use shampoo/soap, rinse your hair and body thoroughly to remove shampoo/soap residue.  Turn the water  OFF and apply about 3 tablespoons (45 ml) of CHG soap to a CLEAN washcloth.  Apply CHG soap  ONLY FROM YOUR NECK DOWN TO YOUR TOES (washing for 3-5 minutes)  DO NOT use CHG soap on face, private areas, open wounds, or sores.  Pay special attention to the area where your surgery is being performed.  If you are having back surgery, having someone wash your back for you may be helpful. Wait 2 minutes after CHG soap is applied, then you may rinse off the CHG soap.  Pat dry with a clean towel  Put on clean clothes/pajamas   If you choose to wear lotion, please use ONLY the CHG-compatible lotions on the back of this paper.     Additional instructions for the day of surgery: DO NOT APPLY any lotions, deodorants, cologne, or perfumes.   Put on clean/comfortable clothes.  Brush your teeth.  Ask your nurse before applying any prescription medications to the skin.      CHG Compatible Lotions   Aveeno Moisturizing lotion  Cetaphil Moisturizing Cream  Cetaphil Moisturizing Lotion  Clairol Herbal Essence Moisturizing Lotion, Dry Skin  Clairol  Herbal Essence Moisturizing Lotion, Extra Dry Skin  Clairol Herbal Essence Moisturizing Lotion, Normal Skin  Curel Age Defying Therapeutic Moisturizing Lotion with Alpha Hydroxy  Curel Extreme Care Body Lotion  Curel Soothing Hands Moisturizing Hand Lotion  Curel Therapeutic Moisturizing Cream, Fragrance-Free  Curel Therapeutic Moisturizing Lotion, Fragrance-Free  Curel Therapeutic Moisturizing Lotion, Original Formula  Eucerin Daily Replenishing Lotion  Eucerin Dry Skin Therapy Plus Alpha Hydroxy Crme  Eucerin Dry Skin Therapy Plus Alpha Hydroxy Lotion  Eucerin Original Crme  Eucerin Original Lotion  Eucerin Plus Crme Eucerin Plus Lotion  Eucerin TriLipid Replenishing Lotion  Keri Anti-Bacterial Hand Lotion  Keri Deep Conditioning Original Lotion Dry Skin Formula Softly Scented  Keri Deep Conditioning Original Lotion, Fragrance Free Sensitive Skin Formula  Keri Lotion Fast Absorbing Fragrance Free Sensitive Skin Formula  Keri Lotion Fast Absorbing Softly Scented Dry Skin Formula  Keri Original Lotion  Keri Skin Renewal Lotion Keri Silky Smooth Lotion  Keri Silky Smooth Sensitive Skin Lotion  Nivea Body Creamy Conditioning Oil  Nivea Body Extra Enriched Lotion  Nivea Body Original Lotion  Nivea Body Sheer Moisturizing Lotion Nivea Crme  Nivea Skin Firming Lotion  NutraDerm 30 Skin Lotion  NutraDerm Skin Lotion  NutraDerm Therapeutic Skin Cream  NutraDerm Therapeutic Skin Lotion  ProShield Protective Hand Cream  Provon moisturizing lotion  How to Use an Incentive Spirometer An incentive spirometer is a tool that measures how well you are filling your lungs with each breath. Learning to take long, deep breaths using this tool can help you keep your lungs clear and active. This may help to reverse or lessen your chance of developing breathing (pulmonary) problems, especially infection. You may be asked to use a spirometer: After a surgery. If you have a lung problem or a  history of smoking. After a long period of time when you have been unable to move or be active. If the spirometer includes an indicator to show the highest number that you have reached, your health care provider or respiratory therapist will help you set a goal. Keep a log of your progress as told by your health care provider. What are the risks? Breathing too quickly may cause dizziness or cause you to pass out. Take your time so you do not get dizzy or light-headed. If you are in pain, you may need to take pain medicine before doing incentive spirometry. It is harder to take a deep breath if you are having pain. How to  use your incentive spirometer  Sit up on the edge of your bed or on a chair. Hold the incentive spirometer so that it is in an upright position. Before you use the spirometer, breathe out normally. Place the mouthpiece in your mouth. Make sure your lips are closed tightly around it. Breathe in slowly and as deeply as you can through your mouth, causing the piston or the ball to rise toward the top of the chamber. Hold your breath for 3-5 seconds, or for as long as possible. If the spirometer includes a coach indicator, use this to guide you in breathing. Slow down your breathing if the indicator goes above the marked areas. Remove the mouthpiece from your mouth and breathe out normally. The piston or ball will return to the bottom of the chamber. Rest for a few seconds, then repeat the steps 10 or more times. Take your time and take a few normal breaths between deep breaths so that you do not get dizzy or light-headed. Do this every 1-2 hours when you are awake. If the spirometer includes a goal marker to show the highest number you have reached (best effort), use this as a goal to work toward during each repetition. After each set of 10 deep breaths, cough a few times. This will help to make sure that your lungs are clear. If you have an incision on your chest or abdomen from  surgery, place a pillow or a rolled-up towel firmly against the incision when you cough. This can help to reduce pain while taking deep breaths and coughing. General tips When you are able to get out of bed: Walk around often. Continue to take deep breaths and cough in order to clear your lungs. Keep using the incentive spirometer until your health care provider says it is okay to stop using it. If you have been in the hospital, you may be told to keep using the spirometer at home. Contact a health care provider if: You are having difficulty using the spirometer. You have trouble using the spirometer as often as instructed. Your pain medicine is not giving enough relief for you to use the spirometer as told. You have a fever. Get help right away if: You develop shortness of breath. You develop a cough with bloody mucus from the lungs. You have fluid or blood coming from an incision site after you cough. Summary An incentive spirometer is a tool that can help you learn to take long, deep breaths to keep your lungs clear and active. You may be asked to use a spirometer after a surgery, if you have a lung problem or a history of smoking, or if you have been inactive for a long period of time. Use your incentive spirometer as instructed every 1-2 hours while you are awake. If you have an incision on your chest or abdomen, place a pillow or a rolled-up towel firmly against your incision when you cough. This will help to reduce pain. Get help right away if you have shortness of breath, you cough up bloody mucus, or blood comes from your incision when you cough. This information is not intended to replace advice given to you by your health care provider. Make sure you discuss any questions you have with your health care provider. Document Revised: 07/16/2023 Document Reviewed: 07/16/2023 Elsevier Patient Education  2024 Elsevier Inc.    Preoperative Educational Videos for Total Hip, Knee and  Shoulder Replacements  To better prepare for surgery, please view our videos  that explain the physical activity and discharge planning required to have the best surgical recovery at [facility].  [email or web address]  Questions? Call [phone] or email [email or web address]          Community Resource Directory to address health-related social needs:  [email or web address]

## 2024-09-02 ENCOUNTER — Ambulatory Visit: Payer: Self-pay | Admitting: Urgent Care

## 2024-09-02 LAB — URINE CULTURE: Culture: >=100000 — AB

## 2024-09-04 ENCOUNTER — Telehealth: Payer: Self-pay | Admitting: Urgent Care

## 2024-09-04 DIAGNOSIS — N390 Urinary tract infection, site not specified: Secondary | ICD-10-CM

## 2024-09-04 DIAGNOSIS — Z01812 Encounter for preprocedural laboratory examination: Secondary | ICD-10-CM

## 2024-09-04 MED ORDER — CEPHALEXIN 500 MG PO CAPS: 500.0000 mg | ORAL_CAPSULE | Freq: Two times a day (BID) | ORAL | 0 refills | Status: AC

## 2024-09-04 NOTE — Progress Notes (Signed)
 [facility] Perioperative Services: Pre-Admission/Anesthesia Testing  Abnormal Lab Notification and Treatment Plan of Care   Date: 09/04/2024  Name: Leah Rogers DOB: 07/17/1961 MRN:   [id number]  Re: Abnormal labs noted during PAT appointment   Notified:  Provider Name Provider Role Notification Mode  Lorelle Hussar, MD Orthopedics (Surgeon) Routed and/or faxed via [facility]   Abnormal Lab Value(s):   Lab Results  Component Value Date   COLORURINE YELLOW (A) 08/31/2024   APPEARANCEUR CLOUDY (A) 08/31/2024   LABSPEC 1.017 08/31/2024   PHURINE 5.0 08/31/2024   GLUCOSEU NEGATIVE 08/31/2024   HGBUR MODERATE (A) 08/31/2024   BILIRUBINUR NEGATIVE 08/31/2024   KETONESUR NEGATIVE 08/31/2024   PROTEINUR 30 (A) 08/31/2024   UROBILINOGEN 0.2 01/02/2016   NITRITE NEGATIVE 08/31/2024   LEUKOCYTESUR LARGE (A) 08/31/2024   EPIU 0-5 08/31/2024   WBCU >50 08/31/2024   RBCU 21-50 08/31/2024   BACTERIA MANY (A) 08/31/2024   CULT >=100,000 COLONIES/mL KLEBSIELLA PNEUMONIAE (A) 08/31/2024    Clinical Information and Notes:  Patient is scheduled for an elective ARTHROPLASTY, KNEE, TOTAL on 09/11/2024  UA performed in PAT consistent with/concerning for infection.  No leukocytosis noted on CBC; WBC 8.3 Renal function: Estimated Creatinine Clearance: 67.7 mL/min (by C-G formula based on SCr of 0.99 mg/dL). Urine C&S added to assess for pathogenically significant growth.  Impression and Plan:  Leah Rogers with a UA that was (+) for infection; reflex culture sent that gre out pathogenigically significant colony counts. Contacted patient to discuss. Patient reporting that she is experiencing frequency, urgency, and dysuria. No abdominal or back pain. No nausea, vomiting, fever, or chills. Patient with surgery scheduled soon. In efforts to avoid delaying patient's procedure, or have her experience any potentially significant perioperative complications related to the  aforementioned, I would like to proceed with treatment for urinary tract infection.  Allergies reviewed. Culture report also reviewed to ensure culture appropriate coverage is being provided. Will treat with a 5 day course of CEPHALEXIN . Cephalexin  dosed 500mg  BID was equally safe and effective as QID dosing in the treatment of female patients with uncomplicated urinary tract infections [name], 2023). Patient encouraged to complete the entire course of antibiotics even if she begins to feel better.  Meds ordered this encounter  Medications   cephALEXin  (KEFLEX ) 500 MG capsule    Sig: Take 1 capsule (500 mg total) by mouth 2 (two) times daily for 5 days. Increase fluid intake while taking this medication.    Dispense:  10 capsule    Refill:  0    Please contact the patient as soon as it is available for pickup. Rx is for preoperative UTI treatment and needs to be started ASAP.   Patient encouraged to increase her fluid intake as much as possible. Discussed that water  is always best to flush the urinary tract. She was advised to avoid caffeine containing fluids until her infections clears, as caffeine can cause her to experience painful bladder spasms.   May use Tylenol  as needed for pain/fever should she experience these symptoms.   Patient instructed to call surgeon's office or PAT with any questions or concerns related to the above outlined course of treatment. Additionally, she was instructed to call if she feels like she is getting worse overall while on treatment. Results and treatment plan of care forwarded to primary attending surgeon to make them aware.   Encounter Diagnoses  Name Primary?   Pre-operative laboratory examination Yes   UTI due to Klebsiella  species    Citation: [name], [name], [name], [name]. Two Times Versus Four Times Daily Cephalexin  Dosing for the Treatment of Uncomplicated Urinary Tract Infections in Females. Open Forum Infect Dis. 2023  Aug 11;10(9):ofad430. doi: 10.1093/ofid/ofad430. PMID: 62220402; PMCID: EFR89458707.  [name], MSN, APRN, FNP-C, CEN [facility]  Perioperative Services Nurse Practitioner Phone: [phone] Fax: [phone] 09/04/2024 8:18 AM  NOTE: This note has been prepared using Dragon dictation software. Despite my best ability to proofread, there is always the potential that unintentional transcriptional errors may still occur from this process.

## 2024-09-05 ENCOUNTER — Ambulatory Visit: Admitting: Dermatology

## 2024-09-05 ENCOUNTER — Encounter: Payer: Self-pay | Admitting: Dermatology

## 2024-09-05 DIAGNOSIS — R21 Rash and other nonspecific skin eruption: Secondary | ICD-10-CM

## 2024-09-05 DIAGNOSIS — L308 Other specified dermatitis: Secondary | ICD-10-CM | POA: Diagnosis not present

## 2024-09-05 NOTE — Progress Notes (Signed)
 Follow-Up Visit   Subjective  Leah Rogers is a 63 y.o. female who presents for the following: 1 week follow up for severe scalp dermatitis and suture removal. Patient taking fluconazole  150 mg every day x  1 week with one dose remaining. Using clobetasol  solution and ketoconazole  shampoo every day. Patient advises scalp is a little better but not much.   Itchy rash started at arms, abdomen, thighs after appt last week. Using TMC 0.1% cr.   The following portions of the chart were reviewed this encounter and updated as appropriate: medications, allergies, medical history  Review of Systems:  No other skin or systemic complaints except as noted in HPI or Assessment and Plan.  Objective  Well appearing patient in no apparent distress; mood and affect are within normal limits.   A focused examination was performed of the following areas: Scalp, arms, abdomen  Relevant exam findings are noted in the Assessment and Plan.  Left Medial Forearm Pink hyperpigmented scaly papules  Assessment & Plan   Severe scalp dermatitis and lymphadenopathy. Bx suggestive of psoriasis vs seborrheic dermatitis. Prior inverse psoriasis clear with topicals. Cultures pending. Concern for kerion as reaction to bimzelx  Exam: progression of scalp dermatitis with diffuse adherent crusting and reduced purulent drainage. New pink to hyperpigmented scaly papules on trunk and extremities. Axillae clear   Patient c/o joint pain in the knees (knee replacement next week)   Treatment Plan: - continue clobetasol  solution 0.05% twice daily to affected skin, scalp.  - Discussed side effect of super potent topical steroids including atrophy, dyspigmentation, striae, telangectasia, folliculitis, loss of skin pigment, hair growth, tachyphylaxis, risk of systemic absorption with missuse. - finish course of fluconazole  150 mg po - continue ketoconazole  2% shampoo daily, may decrease to every other day if too drying  -  continue triamcinolone  cream BID  Side effects of fluconazole  (diflucan ) include nausea, diarrhea, headache, dizziness, taste changes, rare risk of irritation of the liver, allergy, or decreased blood counts (which could show up as infection or tiredness).   Counseling on psoriasis and coordination of care  psoriasis is a chronic non-curable, but treatable genetic/hereditary disease that may have other systemic features affecting other organ systems such as joints (Psoriatic Arthritis). It is associated with an increased risk of inflammatory bowel disease, heart disease, non-alcoholic fatty liver disease, and depression.  Treatments include light and laser treatments; topical medications; and systemic medications including oral and injectables.    RASH Left Medial Forearm Continue TMC 0.1% cr twice daily to aa rash. Avoid applying to face, groin, and axilla. Use as directed. Long-term use can cause thinning of the skin.  Topical steroids (such as triamcinolone , fluocinolone, fluocinonide, mometasone, clobetasol , halobetasol, betamethasone, hydrocortisone ) can cause thinning and lightening of the skin if they are used for too long in the same area. Your physician has selected the right strength medicine for your problem and area affected on the body. Please use your medication only as directed by your physician to prevent side effects.   - Skin / nail biopsy - Left Medial Forearm Type of biopsy: tangential   Informed consent: discussed and consent obtained   Timeout: patient name, date of birth, surgical site, and procedure verified   Procedure prep:  Patient was prepped and draped in usual sterile fashion Prep type:  Isopropyl alcohol Anesthesia: the lesion was anesthetized in a standard fashion   Anesthetic:  1% lidocaine  w/ epinephrine  1-100,000 buffered w/ 8.4% NaHCO3 Instrument used: DermaBlade   Hemostasis achieved with:  pressure and aluminum chloride   Outcome: patient tolerated  procedure well   Post-procedure details: sterile dressing applied and wound care instructions given   Dressing type: bandage and petrolatum    Specimen 1 - Surgical pathology Differential Diagnosis: Atopic vs Psoriasis vs Drug Eruption vs PR  Check Margins: No  [facility]  Return if symptoms worsen or fail to improve.  Leah Rogers, RMA, am acting as scribe for Boneta Sharps, MD .   Documentation: I have reviewed the above documentation for accuracy and completeness, and I agree with the above.  Boneta Sharps, MD

## 2024-09-05 NOTE — Patient Instructions (Signed)

## 2024-09-06 ENCOUNTER — Encounter: Payer: Self-pay | Admitting: Dermatology

## 2024-09-06 ENCOUNTER — Telehealth: Payer: Self-pay | Admitting: Dermatology

## 2024-09-06 LAB — SURGICAL PATHOLOGY

## 2024-09-06 NOTE — Telephone Encounter (Signed)
 Tried calling x 2 but got automated message

## 2024-09-07 ENCOUNTER — Telehealth: Payer: Self-pay | Admitting: Dermatology

## 2024-09-07 DIAGNOSIS — T50905A Adverse effect of unspecified drugs, medicaments and biological substances, initial encounter: Secondary | ICD-10-CM

## 2024-09-07 MED ORDER — PREDNISONE 10 MG PO TABS: ORAL_TABLET | ORAL | 0 refills | Status: AC

## 2024-09-07 NOTE — Telephone Encounter (Signed)
 Spoke to patient by phone. Dr Lorelle is ok with prednisone . Risks of prednisone  taper include mood irritability, insomnia, weight gain, stomach ulcers, increased risk of infection, increased blood sugar (diabetes), hypertension, osteoporosis with long-term or frequent use, and rare risk of avascular necrosis of the hip. Start taking now to start improvement and because Dr Lorelle would benefit from not having rash in the planned surgical area. Patient previously tolerated pred 60  mg without side effects. Sent pred taper.

## 2024-09-07 NOTE — Telephone Encounter (Signed)
-----   Message ----- From: Lorelle Hussar, MD Sent: 09/07/2024   7:32 AM EST To: Boneta Sharps, MD Subject: RE: Drug reaction to Bimzelx                     Starting prednisone  is ok with me. We take care of lots of patients on chronic steroid treatments and may consider stress dosing around the surgery itself. I would be more concerned about rashes in the surgical area than I would the steroids.   Zach Aberman       ----- Message ----- From: Sharps Boneta, MD Sent: 09/06/2024   7:44 PM EST To: Hussar Lorelle, MD Subject: Drug reaction to Bimzelx                         Hi Dr Lorelle,   I hope you've been well. You are scheduled to do a knee arthroplasty on Ms Hugill on 12/22. We have been treating psoriasis and hidradenitis suppurativa in dermatology clinic. We tried a new medicine called Bimzelx  (IL-17 inhibitor), but Ms Lizaola unfortunately had a bad and unusual reaction to it (severe scalp dermatitis and progressing rash on body). I've tried to avoid prednisone  given Ms Schwalb's prediabetes and obesity, but I think it's the best way to calm this down. I imagine starting prednisone  now would be problematic for your surgery or is it an option? If not an option, when would it be safe for us  to start? Thanks so much.   Terrall Sharps, [facility]

## 2024-09-07 NOTE — Telephone Encounter (Signed)
 Dr. Claudene notified patient via MyChart.

## 2024-09-07 NOTE — Telephone Encounter (Signed)
-----   Message from Boneta Sharps, MD sent at 09/06/2024  7:32 PM EST ----- left medial forearm :       CHRONIC SPONGIOTIC DERMATITIS, EXCORIATED, SEE DESCRIPTION   Plan: tried calling. Sending MyChart

## 2024-09-10 MED ORDER — DEXAMETHASONE SOD PHOSPHATE PF 10 MG/ML IJ SOLN
8.0000 mg | Freq: Once | INTRAMUSCULAR | Status: AC
  Administered 2024-09-11: 8 mg via INTRAVENOUS

## 2024-09-10 MED ORDER — CEFAZOLIN SODIUM-DEXTROSE 2-4 GM/100ML-% IV SOLN
2.0000 g | INTRAVENOUS | Status: AC
  Administered 2024-09-11: 2 g via INTRAVENOUS

## 2024-09-10 MED ORDER — TRANEXAMIC ACID-NACL 1000-0.7 MG/100ML-% IV SOLN
1000.0000 mg | INTRAVENOUS | Status: AC
  Administered 2024-09-11 (×2): 1000 mg via INTRAVENOUS
  Filled 2024-09-10: qty 100

## 2024-09-10 MED ORDER — CHLORHEXIDINE GLUCONATE 0.12 % MT SOLN
15.0000 mL | Freq: Once | OROMUCOSAL | Status: AC
  Administered 2024-09-11: 15 mL via OROMUCOSAL

## 2024-09-10 MED ORDER — LACTATED RINGERS IV SOLN: INTRAVENOUS | Status: DC

## 2024-09-10 MED ORDER — ORAL CARE MOUTH RINSE: 15.0000 mL | Freq: Once | OROMUCOSAL | Status: AC

## 2024-09-11 ENCOUNTER — Encounter: Payer: Self-pay | Admitting: Orthopedic Surgery

## 2024-09-11 ENCOUNTER — Encounter: Admission: RE | Disposition: A | Payer: Self-pay | Attending: Orthopedic Surgery

## 2024-09-11 ENCOUNTER — Other Ambulatory Visit: Payer: Self-pay

## 2024-09-11 ENCOUNTER — Ambulatory Visit: Payer: Self-pay | Admitting: Urgent Care

## 2024-09-11 ENCOUNTER — Encounter: Payer: Self-pay | Admitting: Urgent Care

## 2024-09-11 ENCOUNTER — Ambulatory Visit
Admission: RE | Admit: 2024-09-11 | Discharge: 2024-09-12 | Disposition: A | Discharge status: Home Health | Source: Ambulatory Visit | Attending: Orthopedic Surgery | Admitting: Orthopedic Surgery

## 2024-09-11 ENCOUNTER — Ambulatory Visit

## 2024-09-11 DIAGNOSIS — Z01812 Encounter for preprocedural laboratory examination: Secondary | ICD-10-CM

## 2024-09-11 DIAGNOSIS — Z6838 Body mass index (BMI) 38.0-38.9, adult: Secondary | ICD-10-CM | POA: Insufficient documentation

## 2024-09-11 DIAGNOSIS — M170 Bilateral primary osteoarthritis of knee: Secondary | ICD-10-CM | POA: Insufficient documentation

## 2024-09-11 DIAGNOSIS — Z87891 Personal history of nicotine dependence: Secondary | ICD-10-CM | POA: Diagnosis not present

## 2024-09-11 DIAGNOSIS — E039 Hypothyroidism, unspecified: Secondary | ICD-10-CM | POA: Insufficient documentation

## 2024-09-11 DIAGNOSIS — R7303 Prediabetes: Secondary | ICD-10-CM | POA: Diagnosis not present

## 2024-09-11 DIAGNOSIS — Z96651 Presence of right artificial knee joint: Secondary | ICD-10-CM

## 2024-09-11 DIAGNOSIS — M1711 Unilateral primary osteoarthritis, right knee: Secondary | ICD-10-CM | POA: Diagnosis present

## 2024-09-11 DIAGNOSIS — Z96652 Presence of left artificial knee joint: Secondary | ICD-10-CM | POA: Diagnosis not present

## 2024-09-11 DIAGNOSIS — R8290 Unspecified abnormal findings in urine: Secondary | ICD-10-CM

## 2024-09-11 DIAGNOSIS — R8271 Bacteriuria: Secondary | ICD-10-CM

## 2024-09-11 DIAGNOSIS — E669 Obesity, unspecified: Secondary | ICD-10-CM | POA: Diagnosis not present

## 2024-09-11 DIAGNOSIS — I69351 Hemiplegia and hemiparesis following cerebral infarction affecting right dominant side: Secondary | ICD-10-CM | POA: Insufficient documentation

## 2024-09-11 HISTORY — PX: TOTAL KNEE ARTHROPLASTY: SHX125

## 2024-09-11 SURGERY — ARTHROPLASTY, KNEE, TOTAL
Anesthesia: Spinal | Site: Knee | Laterality: Right

## 2024-09-11 MED ORDER — ASPIRIN 81 MG PO TBEC
81.0000 mg | DELAYED_RELEASE_TABLET | Freq: Every day | ORAL | Status: DC
  Administered 2024-09-11 – 2024-09-12 (×2): 81 mg via ORAL
  Filled 2024-09-11: qty 1

## 2024-09-11 MED ORDER — HYDROCODONE-ACETAMINOPHEN 5-325 MG PO TABS
1.0000 | ORAL_TABLET | ORAL | Status: DC | PRN
  Administered 2024-09-11: 2 via ORAL
  Filled 2024-09-11: qty 2

## 2024-09-11 MED ORDER — PHENYLEPHRINE HCL-NACL 20-0.9 MG/250ML-% IV SOLN
INTRAVENOUS | Status: DC | PRN
  Administered 2024-09-11: 40 ug/min via INTRAVENOUS

## 2024-09-11 MED ORDER — ONDANSETRON HCL 4 MG PO TABS: 4.0000 mg | ORAL_TABLET | Freq: Four times a day (QID) | ORAL | Status: DC | PRN

## 2024-09-11 MED ORDER — ACETAMINOPHEN 500 MG PO TABS
1000.0000 mg | ORAL_TABLET | Freq: Three times a day (TID) | ORAL | Status: DC
  Administered 2024-09-12: 1000 mg via ORAL
  Filled 2024-09-11 (×2): qty 2

## 2024-09-11 MED ORDER — KETOROLAC TROMETHAMINE 15 MG/ML IJ SOLN
INTRAMUSCULAR | Status: AC
  Filled 2024-09-11: qty 1

## 2024-09-11 MED ORDER — FENTANYL CITRATE (PF) 100 MCG/2ML IJ SOLN
INTRAMUSCULAR | Status: AC
  Filled 2024-09-11: qty 2

## 2024-09-11 MED ORDER — CLOPIDOGREL BISULFATE 75 MG PO TABS: 75.0000 mg | ORAL_TABLET | Freq: Every day | ORAL | Status: DC

## 2024-09-11 MED ORDER — ACETAMINOPHEN 10 MG/ML IV SOLN: 1000.0000 mg | Freq: Once | INTRAVENOUS | Status: DC | PRN

## 2024-09-11 MED ORDER — CEFAZOLIN SODIUM-DEXTROSE 2-4 GM/100ML-% IV SOLN
INTRAVENOUS | Status: AC
  Filled 2024-09-11: qty 100

## 2024-09-11 MED ORDER — ENOXAPARIN SODIUM 40 MG/0.4ML IJ SOSY
40.0000 mg | PREFILLED_SYRINGE | INTRAMUSCULAR | Status: DC
  Administered 2024-09-12: 40 mg via SUBCUTANEOUS
  Filled 2024-09-11: qty 0.4

## 2024-09-11 MED ORDER — METOCLOPRAMIDE HCL 5 MG/ML IJ SOLN: 5.0000 mg | Freq: Three times a day (TID) | INTRAMUSCULAR | Status: DC | PRN

## 2024-09-11 MED ORDER — MORPHINE SULFATE (PF) 2 MG/ML IV SOLN: 0.5000 mg | INTRAVENOUS | Status: DC | PRN

## 2024-09-11 MED ORDER — FENTANYL CITRATE (PF) 100 MCG/2ML IJ SOLN: 25.0000 ug | INTRAMUSCULAR | Status: DC | PRN

## 2024-09-11 MED ORDER — TRANEXAMIC ACID-NACL 1000-0.7 MG/100ML-% IV SOLN
INTRAVENOUS | Status: AC
  Filled 2024-09-11: qty 100

## 2024-09-11 MED ORDER — ACETAMINOPHEN 325 MG PO TABS: 325.0000 mg | ORAL_TABLET | Freq: Four times a day (QID) | ORAL | Status: DC | PRN

## 2024-09-11 MED ORDER — SURGIPHOR WOUND IRRIGATION SYSTEM - OPTIME: TOPICAL | Status: DC | PRN

## 2024-09-11 MED ORDER — LEVOTHYROXINE SODIUM 112 MCG PO TABS
112.0000 ug | ORAL_TABLET | ORAL | Status: DC
  Administered 2024-09-12: 112 ug via ORAL
  Filled 2024-09-11: qty 1

## 2024-09-11 MED ORDER — MUPIROCIN 2 % EX OINT: 1.0000 | TOPICAL_OINTMENT | Freq: Two times a day (BID) | CUTANEOUS | 0 refills | Status: DC

## 2024-09-11 MED ORDER — ASPIRIN 81 MG PO CHEW
CHEWABLE_TABLET | ORAL | Status: AC
  Filled 2024-09-11: qty 1

## 2024-09-11 MED ORDER — PANTOPRAZOLE SODIUM 40 MG PO TBEC
DELAYED_RELEASE_TABLET | ORAL | Status: AC
  Filled 2024-09-11: qty 1

## 2024-09-11 MED ORDER — DROPERIDOL 2.5 MG/ML IJ SOLN: 0.6250 mg | Freq: Once | INTRAMUSCULAR | Status: DC | PRN

## 2024-09-11 MED ORDER — ONDANSETRON HCL 4 MG/2ML IJ SOLN
INTRAMUSCULAR | Status: DC | PRN
  Administered 2024-09-11: 4 mg via INTRAVENOUS

## 2024-09-11 MED ORDER — MIDAZOLAM HCL 5 MG/5ML IJ SOLN
INTRAMUSCULAR | Status: DC | PRN
  Administered 2024-09-11: 2 mg via INTRAVENOUS

## 2024-09-11 MED ORDER — ONDANSETRON HCL 4 MG/2ML IJ SOLN
INTRAMUSCULAR | Status: AC
  Filled 2024-09-11: qty 2

## 2024-09-11 MED ORDER — ATORVASTATIN CALCIUM 20 MG PO TABS
ORAL_TABLET | ORAL | Status: AC
  Filled 2024-09-11: qty 1

## 2024-09-11 MED ORDER — EPINEPHRINE PF 1 MG/ML IJ SOLN
INTRAMUSCULAR | Status: AC
  Filled 2024-09-11: qty 1

## 2024-09-11 MED ORDER — KETOROLAC TROMETHAMINE 15 MG/ML IJ SOLN
7.5000 mg | Freq: Four times a day (QID) | INTRAMUSCULAR | Status: AC
  Administered 2024-09-11 – 2024-09-12 (×4): 7.5 mg via INTRAVENOUS
  Filled 2024-09-11 (×3): qty 1

## 2024-09-11 MED ORDER — PROPOFOL 1000 MG/100ML IV EMUL
INTRAVENOUS | Status: AC
  Filled 2024-09-11: qty 100

## 2024-09-11 MED ORDER — CHLORHEXIDINE GLUCONATE 0.12 % MT SOLN
OROMUCOSAL | Status: AC
  Filled 2024-09-11: qty 15

## 2024-09-11 MED ORDER — MIDAZOLAM HCL 2 MG/2ML IJ SOLN
INTRAMUSCULAR | Status: AC
  Filled 2024-09-11: qty 2

## 2024-09-11 MED ORDER — BUPIVACAINE HCL (PF) 0.25 % IJ SOLN
INTRAMUSCULAR | Status: AC
  Filled 2024-09-11: qty 30

## 2024-09-11 MED ORDER — FENTANYL CITRATE (PF) 100 MCG/2ML IJ SOLN
INTRAMUSCULAR | Status: DC | PRN
  Administered 2024-09-11 (×2): 50 ug via INTRAVENOUS

## 2024-09-11 MED ORDER — ONDANSETRON HCL 4 MG/2ML IJ SOLN: 4.0000 mg | Freq: Four times a day (QID) | INTRAMUSCULAR | Status: DC | PRN

## 2024-09-11 MED ORDER — PANTOPRAZOLE SODIUM 40 MG PO TBEC
40.0000 mg | DELAYED_RELEASE_TABLET | Freq: Every day | ORAL | Status: DC
  Administered 2024-09-11 – 2024-09-12 (×2): 40 mg via ORAL
  Filled 2024-09-11: qty 1

## 2024-09-11 MED ORDER — BUPIVACAINE LIPOSOME 1.3 % IJ SUSP
INTRAMUSCULAR | Status: AC
  Filled 2024-09-11: qty 20

## 2024-09-11 MED ORDER — BUPIVACAINE HCL (PF) 0.5 % IJ SOLN
INTRAMUSCULAR | Status: DC | PRN
  Administered 2024-09-11: 2.6 mL via INTRATHECAL

## 2024-09-11 MED ORDER — TRAMADOL HCL 50 MG PO TABS
50.0000 mg | ORAL_TABLET | Freq: Four times a day (QID) | ORAL | Status: DC | PRN
  Administered 2024-09-11 – 2024-09-12 (×2): 50 mg via ORAL
  Filled 2024-09-11 (×2): qty 1

## 2024-09-11 MED ORDER — CHLORHEXIDINE GLUCONATE 4 % EX SOLN: 1.0000 | CUTANEOUS | 1 refills | Status: DC

## 2024-09-11 MED ORDER — PROPOFOL 500 MG/50ML IV EMUL
INTRAVENOUS | Status: DC | PRN
  Administered 2024-09-11: 20 mg via INTRAVENOUS
  Administered 2024-09-11: 90 ug/kg/min via INTRAVENOUS
  Administered 2024-09-11: 100 ug/kg/min via INTRAVENOUS
  Administered 2024-09-11: 30 mg via INTRAVENOUS

## 2024-09-11 MED ORDER — OXYCODONE HCL 5 MG PO TABS: 5.0000 mg | ORAL_TABLET | Freq: Once | ORAL | Status: DC | PRN

## 2024-09-11 MED ORDER — OXYCODONE HCL 5 MG/5ML PO SOLN: 5.0000 mg | Freq: Once | ORAL | Status: DC | PRN

## 2024-09-11 MED ORDER — MENTHOL 3 MG MT LOZG: 1.0000 | LOZENGE | OROMUCOSAL | Status: DC | PRN

## 2024-09-11 MED ORDER — KETOROLAC TROMETHAMINE 30 MG/ML IJ SOLN
INTRAMUSCULAR | Status: AC
  Filled 2024-09-11: qty 1

## 2024-09-11 MED ORDER — SODIUM CHLORIDE 0.9 % IV SOLN: INTRAVENOUS | Status: DC

## 2024-09-11 MED ORDER — CEFAZOLIN SODIUM-DEXTROSE 2-4 GM/100ML-% IV SOLN
2.0000 g | Freq: Four times a day (QID) | INTRAVENOUS | Status: AC
  Administered 2024-09-11 – 2024-09-12 (×2): 2 g via INTRAVENOUS
  Filled 2024-09-11 (×2): qty 100

## 2024-09-11 MED ORDER — PHENYLEPHRINE 80 MCG/ML (10ML) SYRINGE FOR IV PUSH (FOR BLOOD PRESSURE SUPPORT)
PREFILLED_SYRINGE | INTRAVENOUS | Status: DC | PRN
  Administered 2024-09-11: 80 ug via INTRAVENOUS

## 2024-09-11 MED ORDER — DOCUSATE SODIUM 100 MG PO CAPS
ORAL_CAPSULE | ORAL | Status: AC
  Filled 2024-09-11: qty 1

## 2024-09-11 MED ORDER — SODIUM CHLORIDE 0.9 % IR SOLN
Status: DC | PRN
  Administered 2024-09-11: 3000 mL

## 2024-09-11 MED ORDER — METOCLOPRAMIDE HCL 10 MG PO TABS: 5.0000 mg | ORAL_TABLET | Freq: Three times a day (TID) | ORAL | Status: DC | PRN

## 2024-09-11 MED ORDER — ACETAMINOPHEN 10 MG/ML IV SOLN
INTRAVENOUS | Status: DC | PRN
  Administered 2024-09-11: 1000 mg via INTRAVENOUS

## 2024-09-11 MED ORDER — DOCUSATE SODIUM 100 MG PO CAPS
100.0000 mg | ORAL_CAPSULE | Freq: Two times a day (BID) | ORAL | Status: DC
  Administered 2024-09-11 – 2024-09-12 (×2): 100 mg via ORAL
  Filled 2024-09-11: qty 1

## 2024-09-11 MED ORDER — PHENOL 1.4 % MT LIQD: 1.0000 | OROMUCOSAL | Status: DC | PRN

## 2024-09-11 MED ORDER — ATORVASTATIN CALCIUM 80 MG PO TABS
80.0000 mg | ORAL_TABLET | Freq: Every day | ORAL | Status: DC
  Administered 2024-09-11 – 2024-09-12 (×2): 80 mg via ORAL
  Filled 2024-09-11: qty 1

## 2024-09-11 MED ORDER — SODIUM CHLORIDE (PF) 0.9 % IJ SOLN
INTRAMUSCULAR | Status: DC | PRN
  Administered 2024-09-11: 71 mL

## 2024-09-11 MED ORDER — PHENYLEPHRINE HCL-NACL 20-0.9 MG/250ML-% IV SOLN
INTRAVENOUS | Status: AC
  Filled 2024-09-11: qty 250

## 2024-09-11 MED ORDER — SODIUM CHLORIDE (PF) 0.9 % IJ SOLN
INTRAMUSCULAR | Status: AC
  Filled 2024-09-11: qty 20

## 2024-09-11 SURGICAL SUPPLY — 56 items
BLADE PATELLA REAM PILOT HOLE (MISCELLANEOUS) IMPLANT
BLADE SAW 90X13X1.19 OSCILLAT (BLADE) IMPLANT
BLADE SAW SAG 25X90X1.19 (BLADE) ×1 IMPLANT
BLADE SAW SAG 29X58X.64 (BLADE) ×1 IMPLANT
BNDG ELASTIC 6INX 5YD STR LF (GAUZE/BANDAGES/DRESSINGS) ×1 IMPLANT
BOWL CEMENT MIX W/ADAPTER (MISCELLANEOUS) IMPLANT
BRUSH SCRUB EZ PLAIN DRY (MISCELLANEOUS) IMPLANT
CHLORAPREP W/TINT 26 (MISCELLANEOUS) ×2 IMPLANT
COMPONENT PATELLA PEG 3 32 (Joint) IMPLANT
COMPONET TIB PS KNEE 0D E RT (Joint) IMPLANT
COOLER ICEMAN CLASSIC (MISCELLANEOUS) ×1 IMPLANT
CUFF TRNQT CYL 24X4X16.5-23 (TOURNIQUET CUFF) IMPLANT
CUFF TRNQT CYL 30X4X21-28X (TOURNIQUET CUFF) IMPLANT
DERMABOND ADVANCED .7 DNX12 (GAUZE/BANDAGES/DRESSINGS) ×1 IMPLANT
DRAPE SHEET LG 3/4 BI-LAMINATE (DRAPES) ×2 IMPLANT
DRSG MEPILEX SACRM 8.7X9.8 (GAUZE/BANDAGES/DRESSINGS) ×1 IMPLANT
DRSG OPSITE POSTOP 4X10 (GAUZE/BANDAGES/DRESSINGS) IMPLANT
DRSG OPSITE POSTOP 4X8 (GAUZE/BANDAGES/DRESSINGS) IMPLANT
ELECTRODE REM PT RTRN 9FT ADLT (ELECTROSURGICAL) ×1 IMPLANT
GLOVE BIO SURGEON STRL SZ8 (GLOVE) ×1 IMPLANT
GLOVE BIOGEL PI IND STRL 8 (GLOVE) ×1 IMPLANT
GLOVE PI ORTHO PRO STRL 7.5 (GLOVE) ×2 IMPLANT
GLOVE PI ORTHO PRO STRL SZ8 (GLOVE) ×2 IMPLANT
GLOVE SURG SYN 7.5 PF PI (GLOVE) ×1 IMPLANT
GOWN SRG XL LONG LVL 3 NONREIN (GOWNS) ×1 IMPLANT
GOWN SRG XL LVL 3 NONREINFORCE (GOWNS) ×1 IMPLANT
GOWN STRL REUS W/ TWL LRG LVL3 (GOWN DISPOSABLE) ×1 IMPLANT
HOOD PEEL AWAY T7 (MISCELLANEOUS) ×2 IMPLANT
INSERT TIB ARTISURF SZ8-11X12 (Insert) IMPLANT
KIT TURNOVER KIT A (KITS) ×1 IMPLANT
MANIFOLD NEPTUNE II (INSTRUMENTS) ×1 IMPLANT
MARKER SKIN DUAL TIP RULER LAB (MISCELLANEOUS) ×1 IMPLANT
MAT ABSORB FLUID 56X50 GRAY (MISCELLANEOUS) ×1 IMPLANT
NDL HYPO 21X1.5 SAFETY (NEEDLE) ×1 IMPLANT
NEEDLE HYPO 21X1.5 SAFETY (NEEDLE) ×1 IMPLANT
PACK TOTAL KNEE (MISCELLANEOUS) ×1 IMPLANT
PAD ARMBOARD POSITIONER FOAM (MISCELLANEOUS) ×3 IMPLANT
PAD COLD UNI WRAP-ON (PAD) ×1 IMPLANT
PENCIL SMOKE EVACUATOR (MISCELLANEOUS) ×1 IMPLANT
PIN DRILL HDLS TROCAR 75 4PK (PIN) IMPLANT
PROSTHESIS FEM KNEE PS STD9 RT (Joint) IMPLANT
SCREW FEMALE HEX FIX 25X2.5 (ORTHOPEDIC DISPOSABLE SUPPLIES) IMPLANT
SLEEVE SCD COMPRESS KNEE MED (STOCKING) ×1 IMPLANT
SOL .9 NS 3000ML IRR UROMATIC (IV SOLUTION) ×1 IMPLANT
SOLN STERILE WATER BTL 1000 ML (IV SOLUTION) ×1 IMPLANT
SOLUTION IRRIG SURGIPHOR (IV SOLUTION) ×1 IMPLANT
STOCKINETTE IMPERV 14X48 (MISCELLANEOUS) ×1 IMPLANT
SUT STRATAFIX 14 PDO 36 VLT (SUTURE) ×1 IMPLANT
SUT VIC AB 0 CT1 36 (SUTURE) ×1 IMPLANT
SUT VIC AB 2-0 CT2 27 (SUTURE) ×2 IMPLANT
SUTURE STRATA SPIR 4-0 18 (SUTURE) ×1 IMPLANT
SUTURE VICRYL 1-0 27IN ABS (SUTURE) ×1 IMPLANT
SYR 20ML LL LF (SYRINGE) ×2 IMPLANT
TAPE CLOTH 3X10 WHT NS LF (GAUZE/BANDAGES/DRESSINGS) ×1 IMPLANT
TIP FAN IRRIG PULSAVAC PLUS (DISPOSABLE) ×1 IMPLANT
TRAP FLUID SMOKE EVACUATOR (MISCELLANEOUS) ×1 IMPLANT

## 2024-09-11 NOTE — H&P (Signed)
 History of Present Illness: The patient is an 63 y.o. female seen in clinic today for evaluation of her right knee. Patient reports worsening pain in her right knee for almost 2 years. She had injection 1 year ago and got some relief of her pain from that shot but it was short-term. She reports her knee feels like it is grinding and catching with severe pain up to a 10 out of 10 at its worst and about a 6 out of 10 today with walking. She reports similar pain in her left knee prior to undergoing a left total knee replacement. She reports her left knee did well with the surgery and she has no issues with left knee at this time. She does have a history of CVA most recently in May 2022 with no residual deficits currently takes Plavix  and aspirin . She is here today to discuss surgical intervention for her right knee but she would like to avoid temporizing measures and has severe pain which limits her activities on a daily basis. The patient denies fevers, chills, numbness, tingling, shortness of breath, chest pain, recent illness, or any trauma.  Patient is a non-smoker nondiabetic with no history of metal allergy and a BMI of 37.9 and A1c of 5.9  Past Medical History: Past Medical History:  Diagnosis Date  Hidradenitis axillaris 09/02/2015  History of stroke  Osteoarthritis of knee 09/02/2015  Papillary thyroid  carcinoma (CMS/HHS-HCC) 10/01/2015  Overview: 08/2015 - total thyroidectomy done on 10/14/15 [Dr. Lannette  S/P total thyroidectomy 10/14/2015  Special screening for malignant neoplasms, colon 04/01/2017   Past Surgical History: Past Surgical History:  Procedure Laterality Date  DILATION AND CURETTAGE OF UTERUS  HYSTERECTOMY  myosure polypectomy  REPLACEMENT TOTAL KNEE Left  THYROIDECTOMY TOTAL   Past Family History: Family History  Problem Relation Age of Onset  Lung cancer Mother  Myocardial Infarction (Heart attack) Brother   Medications: Current Outpatient Medications  Medication  Sig Dispense Refill  amoxicillin  (AMOXIL ) 500 MG capsule Take 4 capsules by mouth  aspirin  81 MG EC tablet Take 1 tablet (81 mg total) by mouth once daily 30 tablet 11  atorvastatin  (LIPITOR ) 80 MG tablet Take 80 mg by mouth once daily  calcium  gluconate 60 mg calcium  (650 mg) Tab Take 1 tablet by mouth once daily.  cholecalciferol 1000 unit tablet  cholecalciferol, vitamin D3, 2,000 unit Chew Take 1 tablet by mouth once daily.  clindamycin  (CLEOCIN  T) 1 % topical gel APPLY THIN LAYER TOPICALLY TWICE DAILY  clopidogreL  (PLAVIX ) 75 mg tablet Take 1 tablet by mouth once daily 30 tablet 0  ferrous sulfate  325 (65 FE) MG tablet Take by mouth  levothyroxine  (SYNTHROID ) 100 MCG tablet TAKE 1 TABELT BY MOUTH TWICE A WEEK. TAKING 112 MCG ON OTHER DAYS  levothyroxine  (SYNTHROID ) 112 MCG tablet Take 112 mcg by mouth once daily  metFORMIN  (GLUCOPHAGE -XR) 500 MG XR tablet Take 500 mg by mouth 2 (two) times daily  spironolactone  (ALDACTONE ) 100 MG tablet Take 1 tablet by mouth at bedtime   No current facility-administered medications for this visit.   Allergies: No Known Allergies   Visit Vitals: Vitals:  08/15/24 1314  BP: 118/80    Review of Systems:  A comprehensive 14 point ROS was performed, reviewed, and the pertinent orthopaedic findings are documented in the HPI.  Physical Exam: General/Constitutional: No apparent distress: well-nourished and well developed. Eyes: Pupils equal, round with synchronous movement. Pulmonary exam: Lungs clear to auscultation bilaterally no wheezing rales or rhonchi Cardiac exam: Regular rate  and rhythm no obvious murmurs rubs or gallops. Integumentary: No impressive skin lesions present, except as noted in detailed exam. Neuro/Psych: Normal mood and affect, oriented to person, place and time.  Comprehensive Knee Exam: Gait Antalgic on the right  Alignment Neutral   Inspection Right  Skin Normal appearance with no obvious deformity. No ecchymosis or  erythema.  Soft Tissue No focal soft tissue swelling  Quad Atrophy None   Palpation  Right  Tenderness Medial joint line parapatellar tenderness to palpation  Crepitus + patellofemoral and tibiofemoral crepitus  Effusion None   Range of Motion Right  Flexion 5-90  Extension 5 degree flexion contracture   Ligamentous Exam Right  Lachman Normal  Valgus 0 Normal  Valgus 30 Normal  Varus 0 Normal  Varus 30 Normal  Anterior Drawer Normal  Posterior Drawer Normal   Meniscal Exam Right  Hyperflexion Test Positive  Hyperextension Test Positive  McMurray's Positive   Neurovascular Right  Quadriceps Strength 5/5  Hamstring Strength 5/5  Hip Abductor Strength 4/5  Distal Motor Normal  Distal Sensory Normal light touch sensation  Distal Pulses Normal    Imaging Studies: I reviewed AP, lateral, sunrise and flexed PA weightbearing x-rays of the right knee performed on 07/25/2024 images reviewed by myself. Patient has tricompartmental to severe degenerative changes with joint space narrowing, sclerosis, osteophyte formation and subchondral cystic changes. There is medial patellofemoral bone-on-bone articulation with some bony erosion. Kellgren-Lawrence grade 4. AP, flexed PA and sunrise left knee show status post cemented total knee arthroplasty with patellar resurfacing with components in unchanged position with no evidence of periprosthetic fracture or loosening.  Assessment:  Right knee osteoarthritis  Plan: Lou is a 63 year old female presents with right knee bone on bone arthritis. Based upon the patient's continued symptoms and failure to respond to conservative treatment, I have recommended a right total knee replacement for this patient. A long discussion took place with the patient describing what a total joint replacement is and what the procedure would entail. A knee model, similar to the implants that will be used during the operation, was utilized to demonstrate the  implants. Choices of implant manufactures were discussed and reviewed. The ability to secure the implant utilizing cement or cementless (press fit) fixation was discussed. The approach and exposure was discussed.   The hospitalization and post-operative care and rehabilitation were also discussed. The use of perioperative antibiotics and DVT prophylaxis were discussed. The risk, benefits and alternatives to a surgical intervention were discussed at length with the patient. The patient was also advised of risks related to the medical comorbidities and elevated body mass index (BMI). A lengthy discussion took place to review the most common complications including but not limited to: stiffness, loss of function, complex regional pain syndrome, deep vein thrombosis, pulmonary embolus, heart attack, stroke, infection, wound breakdown, numbness, intraoperative fracture, damage to nerves, tendon,muscles, arteries or other blood vessels, death and other possible complications from anesthesia. The patient was told that we will take steps to minimize these risks by using sterile technique, antibiotics and DVT prophylaxis when appropriate and follow the patient postoperatively in the office setting to monitor progress. The possibility of recurrent pain, no improvement in pain and actual worsening of pain were also discussed with the patient.   Patient asked about and confirms no history of any reactions to metal or metal allergy in the past.  The discharge plan of care focused on the patient going home following surgery. The patient was encouraged to make the  necessary arrangements to have someone stay with them when they are discharged home.   The benefits of surgery were discussed with the patient including the potential for improving the patient's current clinical condition through operative intervention. Alternatives to surgical intervention including continued conservative management were also discussed in  detail. All questions were answered to the satisfaction of the patient. The patient participated and agreed to the plan of care as well as the use of the recommended implants for their total knee replacement surgery. An information packet was given to the patient to review prior to surgery.   The patient received medical and cardiac clearance for surgery. All questions answered and patient agrees above plan preparations for right total knee replacement.  Portions of this record have been created using Scientist, clinical (histocompatibility and immunogenetics). Dictation errors have been sought, but may not have been identified and corrected.  Arthea Sheer MD

## 2024-09-11 NOTE — Discharge Instructions (Signed)

## 2024-09-11 NOTE — Interval H&P Note (Signed)
Patient history and physical updated. Consent reviewed including risks, benefits, and alternatives to surgery. Patient agrees with above plan to proceed with right total knee arthroplasty  

## 2024-09-11 NOTE — Anesthesia Procedure Notes (Signed)
 Spinal  Patient location during procedure: OR Start time: 09/11/2024 1:22 PM End time: 09/11/2024 1:35 PM Reason for block: surgical anesthesia  Staffing Performed: resident/CRNA  Authorized by: Vicci Camellia Glatter, MD   Performed by: Lorrene Camelia LABOR, CRNA  Preanesthetic Checklist Completed: patient identified, IV checked, site marked, risks and benefits discussed, surgical consent, monitors and equipment checked, pre-op evaluation and timeout performed Spinal Block Patient position: sitting Prep: ChloraPrep Patient monitoring: heart rate, continuous pulse ox, blood pressure and cardiac monitor Approach: midline Location: L4-5 Injection technique: single-shot Needle Needle type: Introducer and Pencan  Needle gauge: 22 G Needle length: 9 cm Assessment Events: CSF return  Additional Notes Negative paresthesia. Negative blood return. Positive free-flowing CSF. Expiration date of kit checked and confirmed. Patient tolerated procedure well, without complications.

## 2024-09-11 NOTE — Op Note (Signed)
 Patient Name: Leah Rogers  FMW:[id number]  Pre-Operative Diagnosis: Right knee Osteoarthritis  Post-Operative Diagnosis: (same)  Procedure: Right Total Knee Arthroplasty  Components/Implants: Femur: Persona Size 9 CR PPS   Tibia: Persona Size E OsseoTi  Poly: 12mm MC  Patella: 32x8mm symmetric  Femoral Valgus Cut Angle: 5 degrees  Distal Femoral Re-cut: none  Patella Resurfacing: yes   Date of Surgery: 09/11/2024  Surgeon: Arthea Sheer MD  Assistant: Debby Amber PA (present and scrubbed throughout the case, critical for assistance with exposure, retraction, instrumentation, and closure)   Anesthesiologist: Vicci  Anesthesia: Spinal   Tourniquet Time: 55 min  EBL: 25cc  IVF: 900cc  Complications: None   Brief history: The patient is a 63 year old female with a history of osteoarthritis of the right knee with pain limiting their range of motion and activities of daily living, which has failed multiple attempts at conservative therapy.  The risks and benefits of total knee arthroplasty as definitive surgical treatment were discussed with the patient, who opted to proceed with the operation.  After outpatient medical clearance and optimization was completed the patient was admitted to [facility] for the procedure.  All preoperative films were reviewed and an appropriate surgical plan was made prior to surgery. Preoperative range of motion was 5 to 90 with a 5 deg flexion contracture. The patient was identified as having a varus alignment.   Description of procedure: The patient was brought to the operating room where laterality was confirmed by all those present to be the right side.   Spinal anesthesia was administered and the patient received an intravenous dose of antibiotics for surgical prophylaxis and a dose of tranexamic acid .  Patient is positioned supine on the operating room table with all bony prominences well-padded.  A well-padded  tourniquet was applied to the right thigh.  The knee was then prepped and draped in usual sterile fashion with multiple layers of adhesive and nonadhesive drapes.  All of those present in the operating room participated in a surgical timeout laterality and patient were confirmed.   An Esmarch was wrapped around the extremity and the leg was elevated and the knee flexed.  The tourniquet was inflated to a pressure of 250 mmHg. The Esmarch was removed and the leg was brought down to full extension.  The patella and tibial tubercle identified and outlined using a marking pen and a midline skin incision was made with a knife carried through the subcutaneous tissue down to the extensor retinaculum.  After exposure of the extensor mechanism the medial parapatellar arthrotomy was performed with a scalpel and electrocautery extending down medial and distal to the tibial tubercle taking care to avoid incising the patellar tendon.   A standard medial release was performed over the proximal tibia.  The knee was brought into extension in order to excise the fat pad taking care not to damage the patella tendon.  The superior soft tissue was removed from the anterior surface of the distal femur to visualize for the procedure.  The knee was then brought into flexion with the patella subluxed laterally and subluxing the tibia anteriorly.  The ACL was transected and removed with electrocautery and additional soft tissue was removed from the proximal surface of the tibia to fully expose. The PCL was found to be partially intact and was preserved.  An extramedullary tibial cutting guide was then applied to the leg with a spring-loaded ankle clamp placed around the distal tibia just above the malleoli the angulation of  the guide was adjusted to give some posterior slope in the tibial resection with an appropriate varus/valgus alignment.  The resection guide was then pinned to the proximal tibia and the proximal tibial surface was  resected with an oscillating saw.  Careful attention was paid to ensure the blade did not disrupt any of the soft tissues including any lateral or medial ligament.  Attention was then turned to the femur, with the knee slightly flexed a opening drill was used to enter the medullary canal of the femur.  After removing the drill marrow was suctioned out to decompress the distal femur.  An intramedullary femoral guide was then inserted into the drill hole and the alignment guide was seated firmly against the distal end of the medial femoral condyle.  The distal femoral cutting guide was then attached and pinned securely to the anterior surface of the femur and the intramedullary rod and alignment guide was removed.  Distal femur resection was then performed with an oscillating saw with retractors protecting medial and laterally.   The distal cutting block was then removed and the extension gap was checked with a spacer.  Extension gap was found to be appropriately sized to accommodate the spacer block.   The femoral sizing guide was then placed securely into the posterior condyles of the femur and the femoral size was measured and determined to be 9.  The size 9; 4-in-1 cutting guide was placed in position and secured with 2 pins.  The anterior posterior and chamfer resections were then performed with an oscillating saw.  Bony fragments and osteophytes were then removed.  Using a lamina spreader the posterior medial and lateral condyles were checked for additional osteophytes and posterior soft tissue remnants.  Any remaining meniscus was removed at this time.  Periarticular injection was performed in the meniscal rims and posterior capsule with aspiration performed to ensure no intravascular injection.   The tibia was then exposed and the tibial trial was pinned onto the plateau after confirming appropriate orientation and rotation.  Using the drill bushing the tibia was prepared to the appropriate drill  depth.  Tibial broach impactor was then driven through the punch guide using a mallet.  The femoral trial component was then inserted onto the femur. A trial tibial polyethylene bearing was then placed and the knee was reduced.  The knee achieved full extension with no hyperextension and was found to be balanced in flexion and extension with the trials in place.  The knee was then brought into full extension the patella was everted and held with 2 Kocher clamps.  The articular surface of the patella was then resected with an patella reamer and saw after careful measurement with a caliper.  The patella was then prepared with the drill guide and a trial patella was placed.  The knee was then taken through range of motion and it was found that the patella articulated appropriately with the trochlea and good patellofemoral motion without subluxation.    The correct final components for implantation were confirmed and opened by the circulator nurse.  The knee was irrigated with normal saline via pulsatile lavage to remove any bony debris or soft tissue.  The prepared surface of the tibia was exposed and the tibial component was implanted with good bony contact.  The femoral component was then placed and impacted showing good coverage and a good snug fit.  The patella was then cleared off and the patella compression tool was used to apply the patellar component  with symmetric compression onto the patella.  The tibial component was then irrigated and cleared of any debris and a real polyethylene component was placed and engaged with the locking mechanism.  The knee was then injected with the particular cocktail.  The knee was taken through range of motion and found to be stable in flexion and extension with patellar tracking.  The knee was then irrigated with copious amount of normal saline via pulsatile lavage to remove all loose bodies and other debris.  The knee was then irrigated with surgiphor betadine  based wash  and reirrigated with saline.  The tourniquet was then dropped and all bleeding vessels were identified and coagulated.  The arthrotomy was approximated with #1 Vicryl and closed with #1 Stratafix suture.  The knee was brought into slight flexion and the subcutaneous tissues were closed with 0 Vicryl, 2-0 Vicryl and a running subcuticular 4-0 stratafix barbed suture.  Skin was then glued with Dermabond.  A sterile adhesive dressing was then placed along with a sequential compression device to the calf, a Ted stocking, and a cryotherapy cuff.   Sponge, needle, and Lap counts were all correct at the end of the case.   The patient was transferred off of the operating room table to a hospital bed, good pulses were found distally on the operative side.  The patient was transferred to the recovery room in stable condition.

## 2024-09-11 NOTE — Progress Notes (Signed)
 Patient has mobility impairment for daily activities. A rolling walker will resolve this and the patient is safe to use it    T. Medford Amber, PA-C [facility]

## 2024-09-11 NOTE — Progress Notes (Signed)
 Patient is not able to walk the distance required to go the bathroom, or she is unable to safely negotiate stairs required to access the bathroom.  A 3in1 BSC will alleviate this problem.       Lollie Marrow, PA-C [facility]

## 2024-09-11 NOTE — Anesthesia Preprocedure Evaluation (Addendum)
"                                    Anesthesia Evaluation  Patient identified by MRN, date of birth, ID band Patient awake    Reviewed: Allergy & Precautions, NPO status , Patient's Chart, lab work & pertinent test results  History of Anesthesia Complications Negative for: history of anesthetic complications  Airway Mallampati: III   Neck ROM: Full    Dental  (+) Missing   Pulmonary former smoker   Pulmonary exam normal breath sounds clear to auscultation       Cardiovascular Exercise Tolerance: Poor Normal cardiovascular exam Rhythm:Regular Rate:Normal  ECG 06/11/23: normal   Neuro/Psych CVA (2021 residual right arm weakness)    GI/Hepatic negative GI ROS,,,  Endo/Other  Hypothyroidism  Prediabetes, obesity  Renal/GU negative Renal ROS     Musculoskeletal  (+) Arthritis ,    Abdominal  (+) + obese  Peds  Hematology  (+) Blood dyscrasia, anemia   Anesthesia Other Findings Walks with a cane Eczema on steroids this week  Reproductive/Obstetrics                              Anesthesia Physical Anesthesia Plan  ASA: 3  Anesthesia Plan: General/Spinal   Post-op Pain Management: Toradol  IV (intra-op)*, Ofirmev  IV (intra-op)* and Regional block*   Induction: Intravenous  PONV Risk Score and Plan: 3 and Propofol  infusion, TIVA, Treatment may vary due to age or medical condition and Ondansetron   Airway Management Planned: Natural Airway  Additional Equipment:   Intra-op Plan:   Post-operative Plan:   Informed Consent: I have reviewed the patients History and Physical, chart, labs and discussed the procedure including the risks, benefits and alternatives for the proposed anesthesia with the patient or authorized representative who has indicated his/her understanding and acceptance.       Plan Discussed with: CRNA  Anesthesia Plan Comments:          Anesthesia Quick Evaluation  "

## 2024-09-11 NOTE — Anesthesia Procedure Notes (Signed)
 Procedure Name: MAC Date/Time: 09/11/2024 1:37 PM  Performed by: Lorrene Camelia LABOR, CRNAPre-anesthesia Checklist: Patient identified, Emergency Drugs available, Suction available and Patient being monitored Patient Re-evaluated:Patient Re-evaluated prior to induction Oxygen Delivery Method: Simple face mask Preoxygenation: Pre-oxygenation with 100% oxygen Induction Type: IV induction

## 2024-09-11 NOTE — Transfer of Care (Signed)
 Immediate Anesthesia Transfer of Care Note  Patient: Leah Rogers  Procedure(s) Performed: ARTHROPLASTY, KNEE, TOTAL (Right: Knee)  Patient Location: PACU  Anesthesia Type:Spinal  Level of Consciousness: awake, alert , and drowsy  Airway & Oxygen Therapy: Patient Spontanous Breathing and Patient connected to face mask oxygen  Post-op Assessment: Report given to RN and Post -op Vital signs reviewed and stable  Post vital signs: Reviewed and stable  Last Vitals:  Vitals Value Taken Time  BP 101/72 09/11/24 15:41  Temp    Pulse 64 09/11/24 15:41  Resp 13 09/11/24 15:41  SpO2 100 % 09/11/24 15:41    Last Pain:  Vitals:   09/11/24 1025  TempSrc: Temporal  PainSc: 2          Complications: No notable events documented.

## 2024-09-11 NOTE — Progress Notes (Signed)
 PT Cancellation Note  Patient Details Name: Leah Rogers MRN: [id number] DOB: 1961/03/18   Cancelled Treatment:    Reason Eval/Treat Not Completed: Per conversation with nursing in PACU the patient just recently arrived to the PACU from surgery and is not yet medically ready to participate with PT services.  Will attempt to see pt at a future date/time as medically appropriate.    CHARM Glendia Bertin PT, DPT 09/11/2024, 4:17 PM

## 2024-09-11 NOTE — Plan of Care (Signed)
  Problem: Education: Goal: Knowledge of the prescribed therapeutic regimen will improve Outcome: Progressing   Problem: Activity: Goal: Range of joint motion will improve Outcome: Progressing   Problem: Clinical Measurements: Goal: Postoperative complications will be avoided or minimized Outcome: Progressing   Problem: Pain Management: Goal: Pain level will decrease with appropriate interventions Outcome: Progressing   Problem: Skin Integrity: Goal: Will show signs of wound healing Outcome: Progressing

## 2024-09-12 ENCOUNTER — Encounter: Payer: Self-pay | Admitting: Orthopedic Surgery

## 2024-09-12 ENCOUNTER — Other Ambulatory Visit: Payer: Self-pay

## 2024-09-12 DIAGNOSIS — M170 Bilateral primary osteoarthritis of knee: Secondary | ICD-10-CM | POA: Diagnosis not present

## 2024-09-12 LAB — CBC
HCT: 34.1 % — ABNORMAL LOW (ref 36.0–46.0)
Hemoglobin: 11.5 g/dL — ABNORMAL LOW (ref 12.0–15.0)
MCH: 31.5 pg (ref 26.0–34.0)
MCHC: 33.7 g/dL (ref 30.0–36.0)
MCV: 93.4 fL (ref 80.0–100.0)
Platelets: 266 K/uL (ref 150–400)
RBC: 3.65 MIL/uL — ABNORMAL LOW (ref 3.87–5.11)
RDW: 13.9 % (ref 11.5–15.5)
WBC: 15.3 K/uL — ABNORMAL HIGH (ref 4.0–10.5)
nRBC: 0 % (ref 0.0–0.2)

## 2024-09-12 LAB — BASIC METABOLIC PANEL WITH GFR
Anion gap: 8 (ref 5–15)
BUN: 26 mg/dL — ABNORMAL HIGH (ref 8–23)
CO2: 25 mmol/L (ref 22–32)
Calcium: 7.5 mg/dL — ABNORMAL LOW (ref 8.9–10.3)
Chloride: 102 mmol/L (ref 98–111)
Creatinine, Ser: 0.88 mg/dL (ref 0.44–1.00)
GFR, Estimated: >60 mL/min
Glucose, Bld: 119 mg/dL — ABNORMAL HIGH (ref 70–99)
Potassium: 4.4 mmol/L (ref 3.5–5.1)
Sodium: 136 mmol/L (ref 135–145)

## 2024-09-12 MED ORDER — ENOXAPARIN SODIUM 40 MG/0.4ML IJ SOSY
40.0000 mg | PREFILLED_SYRINGE | INTRAMUSCULAR | 0 refills | Status: AC
  Filled 2024-09-12: qty 5.6, 14d supply, fill #0

## 2024-09-12 MED ORDER — ONDANSETRON HCL 4 MG PO TABS
4.0000 mg | ORAL_TABLET | Freq: Four times a day (QID) | ORAL | 0 refills | Status: AC | PRN
  Filled 2024-09-12: qty 20, 5d supply, fill #0

## 2024-09-12 MED ORDER — CELECOXIB 200 MG PO CAPS
200.0000 mg | ORAL_CAPSULE | Freq: Two times a day (BID) | ORAL | 0 refills | Status: AC
  Filled 2024-09-12: qty 20, 10d supply, fill #0

## 2024-09-12 MED ORDER — MUPIROCIN 2 % EX OINT
1.0000 | TOPICAL_OINTMENT | Freq: Two times a day (BID) | CUTANEOUS | 0 refills | Status: AC
  Filled 2024-09-12: qty 22, 15d supply, fill #0
  Filled 2024-09-12: qty 22, 11d supply, fill #0

## 2024-09-12 MED ORDER — CHLORHEXIDINE GLUCONATE 4 % EX SOLN
1.0000 | CUTANEOUS | 1 refills | Status: AC
  Filled 2024-09-12: qty 236, 5d supply, fill #0

## 2024-09-12 MED ORDER — ACETAMINOPHEN 500 MG PO TABS
1000.0000 mg | ORAL_TABLET | Freq: Three times a day (TID) | ORAL | 0 refills | Status: AC
  Filled 2024-09-12: qty 30, 5d supply, fill #0

## 2024-09-12 MED ORDER — OXYCODONE HCL 5 MG PO TABS
2.5000 mg | ORAL_TABLET | Freq: Three times a day (TID) | ORAL | 0 refills | Status: AC | PRN
  Filled 2024-09-12: qty 20, 7d supply, fill #0

## 2024-09-12 MED ORDER — TRAMADOL HCL 50 MG PO TABS
50.0000 mg | ORAL_TABLET | Freq: Four times a day (QID) | ORAL | 0 refills | Status: AC | PRN
  Filled 2024-09-12: qty 30, 8d supply, fill #0

## 2024-09-12 MED ORDER — DOCUSATE SODIUM 100 MG PO CAPS
100.0000 mg | ORAL_CAPSULE | Freq: Two times a day (BID) | ORAL | 0 refills | Status: AC
  Filled 2024-09-12: qty 10, 5d supply, fill #0

## 2024-09-12 NOTE — Evaluation (Signed)
 Occupational Therapy Evaluation Patient Details Name: Leah Rogers MRN: [id number] DOB: 30-Sep-1960 Today's Date: 09/12/2024   History of Present Illness   Pt is a 63 yo F diagnosed with R knee OA and is s/p elective R TKA.  PMH includes L TKA, CVA with mild RUE weakness, and thyroid  carcinoma.     Clinical Impressions Pt seen for OT evaluation this date, POD#1 from above surgery. Pt was independent in all ADLs prior to surgery. Pt is eager to return to PLOF with less pain and improved safety and independence. Pt currently requires supervision for all LB dressing and toileting tasks and set up assist for UB ADL management while in seated position due to pain and limited AROM of R knee. Pt instructed in polar care mgt, falls prevention strategies, home/routines modifications, DME/AE for LB bathing and dressing tasks, and compression stocking mgt. No further acute OT needs at this time, will sign off in house. Do not currently anticipate any OT needs following this hospitalization.        If plan is discharge home, recommend the following:   A little help with walking and/or transfers     Functional Status Assessment   Patient has not had a recent decline in their functional status     Equipment Recommendations    (already ordered [facility] and RW)     Recommendations for Other Services         Precautions/Restrictions   Precautions Precautions: Fall Recall of Precautions/Restrictions: Intact Restrictions Weight Bearing Restrictions Per Provider Order: Yes RLE Weight Bearing Per Provider Order: Weight bearing as tolerated     Mobility Bed Mobility Overal bed mobility: Modified Independent                  Transfers Overall transfer level: Needs assistance Equipment used: Rolling walker (2 wheels) Transfers: Sit to/from Stand Sit to Stand: Contact guard assist           General transfer comment: good carryover from PT session this AM regarding safety  and hand placement      Balance Overall balance assessment: Needs assistance Sitting-balance support: Feet supported Sitting balance-[name]: Normal     Standing balance support: Bilateral upper extremity supported, During functional activity, Reliant on assistive device for balance Standing balance-[name]: Good Standing balance comment: RW                           ADL either performed or assessed with clinical judgement   ADL Overall ADL's : Needs assistance/impaired     Grooming: Wash/dry hands;Standing;Supervision/safety           Upper Body Dressing : Set up;Sitting   Lower Body Dressing: Supervision/safety;Sitting/lateral leans;Sit to/from stand   Toilet Transfer: Supervision/safety;Comfort height toilet;Rolling walker (2 wheels)   Toileting- Clothing Manipulation and Hygiene: Supervision/safety       Functional mobility during ADLs: Supervision/safety;Rolling walker (2 wheels)       Vision         Perception         Praxis         Pertinent Vitals/Pain Pain Assessment Pain Assessment: 0-10 Pain Score: 7  Pain Location: R knee Pain Descriptors / Indicators: Sore, Aching Pain Intervention(s): Limited activity within patient's tolerance, Patient requesting pain meds-RN notified, Monitored during session, Repositioned     Extremity/Trunk Assessment Upper Extremity Assessment Upper Extremity Assessment: Overall WFL for tasks assessed   Lower Extremity Assessment Lower Extremity Assessment: Defer  to PT evaluation RLE Deficits / Details: Pt able to perform LAQs and Ind SLR with good control with RLE RLE: Unable to fully assess due to pain       Communication Communication Communication: No apparent difficulties   Cognition Arousal: Alert Behavior During Therapy: [facility] for tasks assessed/performed                                 Following commands: Intact       Cueing  General Comments   Cueing  Techniques: Verbal cues;Visual cues      Exercises Other Exercises Other Exercises: Edu on role of OT in acute setting, polar care and TED hose management, ADL modification, etc.   Shoulder Instructions      Home Living Family/patient expects to be discharged to:: Private residence Living Arrangements: Spouse/significant other;Children Available Help at Discharge: Family;Available 24 hours/day Type of Home: House Home Access: Stairs to enter [employer] of Steps: 2 Entrance Stairs-Rails: None Home Layout: One level     Bathroom Shower/Tub: Producer, Television/film/video: Handicapped height     Home Equipment: Engineer, Materials - single point          Prior Functioning/Environment Prior Level of Function : Independent/Modified Independent             Mobility Comments: Ind amb community distances without an AD, no fall history ADLs Comments: Ind with ADLs    OT Problem List:     OT Treatment/Interventions:        OT Goals(Current goals can be found in the care plan section)       OT Frequency:       Co-evaluation              AM-PAC OT 6 Clicks Daily Activity     Outcome Measure Help from another person eating meals?: None Help from another person taking care of personal grooming?: None Help from another person toileting, which includes using toliet, bedpan, or urinal?: None Help from another person bathing (including washing, rinsing, drying)?: None Help from another person to put on and taking off regular upper body clothing?: None Help from another person to put on and taking off regular lower body clothing?: None 6 Click Score: 24   End of Session Equipment Utilized During Treatment: Rolling walker (2 wheels) Nurse Communication: Mobility status  Activity Tolerance: Patient tolerated treatment well Patient left: in chair;with call bell/phone within reach  OT Visit Diagnosis: Other abnormalities of gait and mobility  (R26.89)                Time: 8874-8852 OT Time Calculation (min): 22 min Charges:  OT General Charges $OT Visit: 1 Visit OT Evaluation $OT Eval Low Complexity: 1 Low  Dejanique Ruehl Chrismon, OTR/L 09/12/2024, 1:09 PM  Virgene Tirone E Chrismon 09/12/2024, 1:07 PM

## 2024-09-12 NOTE — Discharge Summary (Signed)
 Physician Discharge Summary  Patient ID: Leah Rogers MRN: [id number] DOB/AGE: 65-Jul-1962 65 y.o.  Admit date: 09/11/2024 Discharge date: 09/12/2024  Admission Diagnoses:  Primary osteoarthritis of right knee [M17.11] S/P total knee arthroplasty, right [Z96.651]   Discharge Diagnoses: Patient Active Problem List   Diagnosis Date Noted   S/P total knee arthroplasty, right 09/11/2024   Inverse psoriasis 05/24/2024   Encounter for long-term (current) use of high-risk medication 05/24/2024   Obesity, morbid (HCC) 02/22/2024   Prediabetes 02/18/2023   Post-menopausal bleeding 02/18/2023   Acquired thrombophilia 12/30/2021   Bilateral carotid artery stenosis 12/26/2020   Mixed hyperlipidemia 06/14/2020   Urinary incontinence in female 02/12/2020   Primary osteoarthritis of both knees 02/12/2020   History of ischemic cerebrovascular accident (CVA) with residual deficit 02/06/2020   Hx of papillary thyroid  carcinoma 10/14/2015   Hidradenitis axillaris 09/02/2015    Past Medical History:  Diagnosis Date   Acquired thrombophilia    Anemia    Arthritis    Bilateral carotid artery stenosis    Cancer (HCC)    Thyroid    Heart murmur    Hidradenitis axillaris    History of CVA (cerebrovascular accident) 02/06/2020   02/06/20   Hx of papillary thyroid  carcinoma    Hypothyroidism    Infected sebaceous cyst 10/26/2018   Inverse psoriasis    Mixed hyperlipidemia    On chronic clopidogrel  therapy    Osteoarthritis of both knees    Ovarian cyst    PMB (postmenopausal bleeding)    Pre-diabetes    Stroke (HCC) 2023   weakness in right arm   Thyroid  disease      Transfusion: none   Consultants (if any):   Discharged Condition: Improved  Hospital Course: Leah Rogers is an 63 y.o. female who was admitted 09/11/2024 with a diagnosis of S/P total knee arthroplasty, right and went to the operating room on 09/11/2024 and underwent the above named procedures.    Surgeries:  Procedures: ARTHROPLASTY, KNEE, TOTAL on 09/11/2024 Patient tolerated the surgery well. Taken to PACU where she was stabilized and then transferred to the orthopedic floor.  Started on Lovenox  40 mg q 24 hrs. TEDs and SCDs applied bilaterally. Heels elevated on bed. No evidence of DVT. Negative Homan. Physical therapy started on day #1 for gait training and transfer. OT started day #1 for ADL and assisted devices.  Patient's IV was d/c on day #1. Patient was able to safely and independently complete all PT goals. PT recommending discharge to home.    On post op day #1 patient was stable and ready for discharge to home with HHPT.  Implants: Femur: Persona Size 9 CR PPS   Tibia: Persona Size E OsseoTi  Poly: 12mm MC  Patella: 32x71mm symmetric    She was given perioperative antibiotics:  Anti-infectives (From admission, onward)    Start     Dose/Rate Route Frequency Ordered Stop   09/11/24 2000  ceFAZolin  (ANCEF ) IVPB 2g/100 mL premix        2 g 200 mL/hr over 30 Minutes Intravenous Every 6 hours 09/11/24 1825 09/12/24 0220   09/11/24 0600  ceFAZolin  (ANCEF ) IVPB 2g/100 mL premix        2 g 200 mL/hr over 30 Minutes Intravenous On call to O.R. 09/10/24 2224 09/11/24 1342     .  She was given sequential compression devices, early ambulation, and Lovenox  TEDs for DVT prophylaxis.  She benefited maximally from the hospital stay and there were no complications.  Recent vital signs:  Vitals:   09/12/24 0536 09/12/24 0748  BP: 130/82 125/84  Pulse: (!) 51 (!) 51  Resp: 16 15  Temp: 97.6 F (36.4 C) 98.7 F (37.1 C)  SpO2: 97% 99%    Recent laboratory studies:  Lab Results  Component Value Date   HGB 11.5 (L) 09/12/2024   HGB 12.9 08/31/2024   HGB 13.0 02/22/2024   Lab Results  Component Value Date   WBC 15.3 (H) 09/12/2024   PLT 266 09/12/2024   Lab Results  Component Value Date   INR 1.0 02/06/2020   Lab Results  Component Value Date   NA 136 09/12/2024   K  4.4 09/12/2024   CL 102 09/12/2024   CO2 25 09/12/2024   BUN 26 (H) 09/12/2024   CREATININE 0.88 09/12/2024   GLUCOSE 119 (H) 09/12/2024    Discharge Medications:   Allergies as of 09/12/2024       Reactions   Bimzelx  [bimekizumab -bkzx] Other (See Comments)   Scalp dermatitis concerning for kerion        Medication List     STOP taking these medications    clopidogrel  75 MG tablet Commonly known as: PLAVIX        TAKE these medications    acetaminophen  500 MG tablet Commonly known as: TYLENOL  Take 2 tablets (1,000 mg total) by mouth every 8 (eight) hours.   atorvastatin  80 MG tablet Commonly known as: LIPITOR  Take 1 tablet by mouth once daily   calcium  gluconate 500 MG tablet Take 2 tablets by mouth daily.   celecoxib  200 MG capsule Commonly known as: CeleBREX  Take 1 capsule (200 mg total) by mouth 2 (two) times daily for 10 days.   chlorhexidine  4 % external liquid Commonly known as: HIBICLENS  Apply 15 mLs (1 Application total) topically as directed for 30 doses. Use as directed daily for 5 days every other week for 6 weeks.   cholecalciferol 25 MCG (1000 UNIT) tablet Commonly known as: VITAMIN D3 Take 1,000 Units by mouth daily.   clobetasol  0.05 % external solution Commonly known as: TEMOVATE  Apply 1 mL to affected areas, scalp twice daily   docusate sodium  100 MG capsule Commonly known as: COLACE Take 1 capsule (100 mg total) by mouth 2 (two) times daily.   enoxaparin  40 MG/0.4ML injection Commonly known as: LOVENOX  Inject 0.4 mLs (40 mg total) into the skin daily. Start taking on: September 13, 2024   EQ Aspirin  Adult Low Dose 81 MG tablet Generic drug: aspirin  EC Take 81 mg by mouth daily.   ferrous sulfate  325 (65 FE) MG tablet Take 325 mg by mouth daily with breakfast.   hydrocortisone  2.5 % cream Apply to aa's psoriasis BID PRN.   ketoconazole  2 % cream Commonly known as: NIZORAL  Apply to affected skin folds twice daily.    ketoconazole  2 % shampoo Commonly known as: NIZORAL  Apply 1 Application topically daily. Lather up on scalp and leave in 5 minutes before rinsing out. Use until scalp clears   levothyroxine  112 MCG tablet Commonly known as: SYNTHROID  Take 112 mcg by mouth every Monday, Tuesday, Wednesday, Thursday, and Friday.   levothyroxine  100 MCG tablet Commonly known as: SYNTHROID  Take 100 mcg by mouth 2 (two) times a week.   metFORMIN  500 MG 24 hr tablet Commonly known as: GLUCOPHAGE -XR Take 1 tablet (500 mg total) by mouth 2 (two) times daily with a meal. Patient takes this for Hidradenitis suppurativa   mupirocin  ointment 2 % Commonly known as: BACTROBAN   Place 1 Application into the nose 2 (two) times daily for 60 doses. Use as directed 2 times daily for 5 days every other week for 6 weeks.   ondansetron  4 MG tablet Commonly known as: ZOFRAN  Take 1 tablet (4 mg total) by mouth every 6 (six) hours as needed for nausea.   oxyCODONE  5 MG immediate release tablet Commonly known as: Roxicodone  Take 0.5-1 tablets (2.5-5 mg total) by mouth every 8 (eight) hours as needed for breakthrough pain.   predniSONE  10 MG tablet Commonly known as: DELTASONE  Take 6 tablets (60 mg total) by mouth daily with breakfast for 1 day, THEN 4 tablets (40 mg total) daily with breakfast for 5 days, THEN 3 tablets (30 mg total) daily with breakfast for 5 days, THEN 2 tablets (20 mg total) daily with breakfast for 5 days, THEN 1 tablet (10 mg total) daily with breakfast for 5 days. Start taking on: September 07, 2024   traMADol  50 MG tablet Commonly known as: ULTRAM  Take 1 tablet (50 mg total) by mouth every 6 (six) hours as needed for moderate pain (pain score 4-6).   triamcinolone  cream 0.1 % Commonly known as: KENALOG  Apply 1 gr twice daily to affected areas of skin. Stop once resolved and restart as needed for flares. Avoid use on face, armpits, groin unless otherwise indicated.               Durable  Medical Equipment  (From admission, onward)           Start     Ordered   09/11/24 2041  For home use only DME Walker rolling  Once       Question Answer Comment  Walker: With 5 Inch Wheels   Patient needs a walker to treat with the following condition Total knee replacement status      09/11/24 2040   09/11/24 2041  For home use only DME 3 n 1  Once        09/11/24 2040            Diagnostic Studies: DG Knee 1-2 Views Right Result Date: 09/11/2024 CLINICAL DATA:  Elective surgery. EXAM: RIGHT KNEE - 1-2 VIEW COMPARISON:  None Available. FINDINGS: Right knee arthroplasty in expected alignment. No periprosthetic lucency or fracture. Recent postsurgical change includes air and edema in the soft tissues and joint space. IMPRESSION: Right knee arthroplasty without immediate postoperative complication. Electronically Signed   By: Andrea Gasman M.D.   On: 09/11/2024 17:54    Disposition:      Follow-up Information     Charlene Debby BROCKS, PA-C Follow up in 2 week(s).   Specialties: Orthopedic Surgery, Emergency Medicine Contact information: [address] [place] [address] [phone]                  Signed: Debby BROCKS Charlene 09/12/2024, 8:27 AM

## 2024-09-12 NOTE — Anesthesia Postprocedure Evaluation (Signed)
"   Anesthesia Post Note  Patient: Leah Rogers  Procedure(s) Performed: ARTHROPLASTY, KNEE, TOTAL (Right: Knee)  Patient location during evaluation: Nursing Unit Anesthesia Type: Combined General/Spinal Level of consciousness: awake Pain management: pain level controlled Respiratory status: spontaneous breathing Postop Assessment: no headache Anesthetic complications: no   No notable events documented.   Last Vitals:  Vitals:   09/12/24 0000 09/12/24 0536  BP: 120/64 130/82  Pulse: 66 (!) 51  Resp:  16  Temp: 36.6 C 36.4 C  SpO2: 97% 97%    Last Pain:  Vitals:   09/12/24 0536  TempSrc: Oral  PainSc:                  Shona Earnie Fare      "

## 2024-09-12 NOTE — Progress Notes (Signed)
 Physical Therapy Treatment Patient Details Name: Leah Rogers MRN: [id number] DOB: [birth year] Today's Date: 09/12/2024   History of Present Illness Pt is a 63 yo F diagnosed with R knee OA and is s/p elective R TKA.  PMH includes L TKA, CVA with mild RUE weakness, and thyroid  carcinoma.    PT Comments  Pt was seated in recliner with supportive caregiver present. She is A and O x 4. Agreeable to session and cooperative throughout. Family present for family education/ re-education on stair performance for home environment. Pt was able to safely/easily perform ascending/descending stairs without use of rails however using RW + caregiver present. She ambulated from stairs, back to hospital room (156ft) without LOB or safety concern.  Vcs for posture correction and heel strike improvements. DC recs remain appropriate to maximize independence and safety with all ADLs.     If plan is discharge home, recommend the following: A little help with walking and/or transfers;A little help with bathing/dressing/bathroom;Assistance with cooking/housework;Assist for transportation;Help with stairs or ramp for entrance     Equipment Recommendations  Rolling walker (2 wheels);BSC/3in1       Precautions / Restrictions Precautions Precautions: Fall Recall of Precautions/Restrictions: Intact Restrictions Weight Bearing Restrictions Per Provider Order: Yes RLE Weight Bearing Per Provider Order: Weight bearing as tolerated     Mobility  Bed Mobility  General bed mobility comments: in recliner pre/post session    Transfers Overall transfer level: Needs assistance Equipment used: Rolling walker (2 wheels) Transfers: Sit to/from Stand Sit to Stand: Supervision  General transfer comment: Supervision only with VCs for handplacement and technique improvements    Ambulation/Gait Ambulation/Gait assistance: Supervision Gait Distance (Feet): 120 Feet Assistive device: Rolling walker (2 wheels) Gait  Pattern/deviations: Step-through pattern Gait velocity: decreased  General Gait Details: no LOB or safety concerns during ambulation with RW   Stairs Stairs: Yes Stairs assistance: Supervision Stair Management: No rails, Step to pattern, Backwards, With walker Number of Stairs: 4 General stair comments: Pt demonstrated safe abilities to ascend/descend stairs with RW without use of rails. Caregiver education /training. Caregiver present when doing stairs    Balance Overall balance assessment: Needs assistance Sitting-balance support: Feet supported Sitting balance-[name]: Normal     Standing balance support: Bilateral upper extremity supported, During functional activity, Reliant on assistive device for balance Standing balance-[name]: Good Standing balance comment: reliant on RW     Communication Communication Communication: No apparent difficulties  Cognition Arousal: Alert Behavior During Therapy: WFL for tasks assessed/performed   PT - Cognitive impairments: No apparent impairments      PT - Cognition Comments: Pt is A and O x 4 Following commands: Intact      Cueing Cueing Techniques: Verbal cues, Tactile cues     General Comments General comments (skin integrity, edema, etc.): Reviewed HEP, polar care use, and importance of routine mobility      Pertinent Vitals/Pain Pain Assessment Pain Assessment: 0-10 Pain Score: 6  Pain Location: R knee Pain Descriptors / Indicators: Sore, Aching Pain Intervention(s): Limited activity within patient's tolerance, Monitored during session, Premedicated before session, Repositioned, Ice applied    Home Living Family/patient expects to be discharged to:: Private residence Living Arrangements: Spouse/significant other;Children Available Help at Discharge: Family;Available 24 hours/day Type of Home: House Home Access: Stairs to enter Entrance Stairs-Rails: None Entrance Stairs-Number of Steps: 2   Home Layout:  One level Home Equipment: Standard Walker;Cane - single point          PT Goals (current  goals can now be found in the care plan section) Acute Rehab PT Goals Patient Stated Goal: To walk better without pain PT Goal Formulation: With patient Time For Goal Achievement: 09/25/24 Potential to Achieve Goals: Good Progress towards PT goals: Progressing toward goals    Frequency    BID       AM-PAC PT 6 Clicks Mobility   Outcome Measure  Help needed turning from your back to your side while in a flat bed without using bedrails?: None Help needed moving from lying on your back to sitting on the side of a flat bed without using bedrails?: None Help needed moving to and from a bed to a chair (including a wheelchair)?: A Little Help needed standing up from a chair using your arms (e.g., wheelchair or bedside chair)?: A Little Help needed to walk in hospital room?: A Little Help needed climbing 3-5 steps with a railing? : A Little 6 Click Score: 20    End of Session Equipment Utilized During Treatment: Gait belt Activity Tolerance: Patient tolerated treatment well Patient left: in chair;with chair alarm set;with SCD's reapplied;with call bell/phone within reach Nurse Communication: Mobility status PT Visit Diagnosis: Other abnormalities of gait and mobility (R26.89);Muscle weakness (generalized) (M62.81);Pain Pain - Right/Left: Right Pain - part of body: Knee     Time: 8763-8743 PT Time Calculation (min) (ACUTE ONLY): 20 min  Charges:    $Gait Training: 8-22 mins PT General Charges $$ ACUTE PT VISIT: 1 Visit                     Rankin Essex PTA 09/12/2024, 1:23 PM

## 2024-09-12 NOTE — Progress Notes (Signed)
 DISCHARGE NOTE:   Pt dc with IV removed and dc instructions. Pt has both TED hose on and in place. Pt educated on how to use the polar care. Pt received medications delivered to hospital room. Pt also received 3 in 1 and RW. Pt has both TED hose on and in place. Pt educated on how to administer and dispose of Lovenox  injections. Pt wheeled down to medical mall entrance by staff. Pt's husband provided transportation.

## 2024-09-12 NOTE — Progress Notes (Signed)
" ° °  Subjective: 1 Day Post-Op Procedures (LRB): ARTHROPLASTY, KNEE, TOTAL (Right) Patient reports pain as mild.   Patient is well, and has had no acute complaints or problems Denies any CP, SOB, ABD pain. We will continue therapy today.  Plan is to go Home after hospital stay.  Objective: Vital signs in last 24 hours: Temp:  [97.2 F (36.2 C)-98.7 F (37.1 C)] 98.7 F (37.1 C) (12/23 0748) Pulse Rate:  [44-66] 51 (12/23 0748) Resp:  [9-16] 15 (12/23 0748) BP: (101-147)/(64-93) 125/84 (12/23 0748) SpO2:  [96 %-100 %] 99 % (12/23 0748) Weight:  [103.8 kg] 103.8 kg (12/22 1025)  Intake/Output from previous day: 12/22 0701 - 12/23 0700 In: 2578.3 [I.V.:1978.3; IV Piggyback:600] Out: 300 [Urine:250; Blood:50] Intake/Output this shift: Total I/O In: -  Out: 350 [Urine:350]  Recent Labs    09/12/24 0657  HGB 11.5*   Recent Labs    09/12/24 0657  WBC 15.3*  RBC 3.65*  HCT 34.1*  PLT 266   Recent Labs    09/12/24 0657  NA 136  K 4.4  CL 102  CO2 25  BUN 26*  CREATININE 0.88  GLUCOSE 119*  CALCIUM  7.5*   No results for input(s): LABPT, INR in the last 72 hours.  EXAM General - Patient is Alert, Appropriate, and Oriented Extremity - Neurovascular intact Sensation intact distally Intact pulses distally Dorsiflexion/Plantar flexion intact No cellulitis present Compartment soft Dressing - dressing C/D/I and no drainage Motor Function - intact, moving foot and toes well on exam.   Past Medical History:  Diagnosis Date   Acquired thrombophilia    Anemia    Arthritis    Bilateral carotid artery stenosis    Cancer (HCC)    Thyroid    Heart murmur    Hidradenitis axillaris    History of CVA (cerebrovascular accident) 02/06/2020   02/06/20   Hx of papillary thyroid  carcinoma    Hypothyroidism    Infected sebaceous cyst 10/26/2018   Inverse psoriasis    Mixed hyperlipidemia    On chronic clopidogrel  therapy    Osteoarthritis of both knees    Ovarian  cyst    PMB (postmenopausal bleeding)    Pre-diabetes    Stroke (HCC) 2023   weakness in right arm   Thyroid  disease     Assessment/Plan:   1 Day Post-Op Procedures (LRB): ARTHROPLASTY, KNEE, TOTAL (Right) Principal Problem:   S/P total knee arthroplasty, right  Estimated body mass index is 38.07 kg/m as calculated from the following:   Height as of this encounter: 5' 5 (1.651 m).   Weight as of this encounter: 103.8 kg. Advance diet Up with therapy Pain well controlled Labs and VSS CM to assist with discharge to home with HHPT today  DVT Prophylaxis - Lovenox , TED hose, and SCDs Weight-Bearing as tolerated to right leg   T. Medford Amber, PA-C [facility] 09/12/2024, 8:23 AM   "

## 2024-09-12 NOTE — Evaluation (Signed)
 Physical Therapy Evaluation Patient Details Name: Leah Rogers MRN: [id number] DOB: 04/06/1961 Today's Date: 09/12/2024  History of Present Illness  Pt is a 63 yo F diagnosed with R knee OA and is s/p elective R TKA.  PMH includes L TKA, CVA with mild RUE weakness, and thyroid  carcinoma.  Clinical Impression  Pt was pleasant and motivated to participate during the session and put forth good effort throughout. Pt able to perform bed mobility tasks with min increased time and effort but with no assist needed. Pt given cues for general sequencing during transfer training from various surfaces and demonstrated good eccentric and concentric control and stability throughout.  Pt able to amb 125' with mildly antalgic gait pattern with step-to pattern initially that quickly progressed to beginning step-through pattern but with notable decreased in LLE step length compared to R; steady throughout with no overt LOB or buckling.  Pt able to ascend and descend 4 steps per below with good control and stability.  Upon getting back to room at end of session pt c/o feeling minimally nauseous and was noted to be diaphoretic with cool, clammy skin.  Nursing notified and entered room to assist with vitals with BP WNL and similar to earlier BP, HR in the 50s, and SpO2 WNL on room air, MD notified.  Pt quickly reported feeling better after lying down and was left with nursing in the room.  Pt will benefit from continued PT services upon discharge to safely address deficits listed in patient problem list for decreased caregiver assistance and eventual return to PLOF.            If plan is discharge home, recommend the following: A little help with walking and/or transfers;A little help with bathing/dressing/bathroom;Assistance with cooking/housework;Assist for transportation;Help with stairs or ramp for entrance   Can travel by private vehicle        Equipment Recommendations Rolling walker (2 wheels);BSC/3in1   Recommendations for Other Services       Functional Status Assessment Patient has had a recent decline in their functional status and demonstrates the ability to make significant improvements in function in a reasonable and predictable amount of time.     Precautions / Restrictions Precautions Precautions: Fall Recall of Precautions/Restrictions: Intact Restrictions Weight Bearing Restrictions Per Provider Order: Yes RLE Weight Bearing Per Provider Order: Weight bearing as tolerated      Mobility  Bed Mobility Overal bed mobility: Modified Independent             General bed mobility comments: MIn extra time and effort only    Transfers Overall transfer level: Needs assistance Equipment used: Rolling walker (2 wheels) Transfers: Sit to/from Stand Sit to Stand: From elevated surface, Contact guard assist           General transfer comment: Min verbal cues for BUE and RLE positioning during transfer training from multiple height surfaces    Ambulation/Gait Ambulation/Gait assistance: Contact guard assist Gait Distance (Feet): 125 Feet Assistive device: Rolling walker (2 wheels) Gait Pattern/deviations: Step-to pattern, Step-through pattern, Decreased step length - left, Decreased stance time - right, Antalgic, Trunk flexed Gait velocity: decreased     General Gait Details: Mildly antalgic gait pattern with step-to pattern initially that quickly progressed to beginning step-through pattern but with notable decreased in LLE step length compared to R; steady throughout with no overt LOB or buckling  Stairs Ascend backwards and descend forwards 4 steps with RW with step-to pattern and min to mod verbal and visual  cues for sequencing; good carryover and stability throughout             Wheelchair Mobility     Tilt Bed    Modified Rankin (Stroke Patients Only)       Balance Overall balance assessment: Needs assistance   Sitting balance-[name]:  Normal     Standing balance support: Bilateral upper extremity supported, During functional activity, Reliant on assistive device for balance Standing balance-[name]: Good                               Pertinent Vitals/Pain Pain Assessment Pain Assessment: 0-10 Pain Score: 5  Pain Location: R knee Pain Descriptors / Indicators: Sore, Aching Pain Intervention(s): Premedicated before session, Monitored during session, Ice applied    Home Living Family/patient expects to be discharged to:: Private residence Living Arrangements: Spouse/significant other;Children Available Help at Discharge: Family;Available 24 hours/day Type of Home: House Home Access: Stairs to enter Entrance Stairs-Rails: None Entrance Stairs-Number of Steps: 2   Home Layout: One level Home Equipment: Standard Walker;Cane - single point      Prior Function Prior Level of Function : Independent/Modified Independent             Mobility Comments: Ind amb community distances without an AD, no fall history ADLs Comments: Ind with ADLs     Extremity/Trunk Assessment   Upper Extremity Assessment Upper Extremity Assessment: Defer to OT evaluation    Lower Extremity Assessment Lower Extremity Assessment: RLE deficits/detail RLE Deficits / Details: Pt able to perform LAQs and Ind SLR with good control with RLE RLE: Unable to fully assess due to pain       Communication   Communication Communication: No apparent difficulties    Cognition Arousal: Alert Behavior During Therapy: WFL for tasks assessed/performed   PT - Cognitive impairments: No apparent impairments                         Following commands: Intact       Cueing Cueing Techniques: Verbal cues, Visual cues     General Comments      Exercises Total Joint Exercises Quad Sets: AROM, Strengthening, Right, 5 reps, 10 reps Long Arc Quad: AROM, Strengthening, Right, 10 reps, 5 reps Knee Flexion: AROM,  Strengthening, Right, 5 reps, 10 reps Goniometric ROM: R knee AROM: 7-81 deg Marching in Standing: AROM, Strengthening, Standing, Both, 5 reps Other Exercises Other Exercises: Positioning education to promote R knee ext PROM Other Exercises: HEP education per handout   Assessment/Plan    PT Assessment Patient needs continued PT services  PT Problem List Decreased strength;Decreased range of motion;Decreased activity tolerance;Decreased balance;Decreased mobility;Decreased knowledge of use of DME;Pain       PT Treatment Interventions DME instruction;Gait training;Stair training;Functional mobility training;Therapeutic activities;Therapeutic exercise;Balance training;Patient/family education    PT Goals (Current goals can be found in the Care Plan section)  Acute Rehab PT Goals Patient Stated Goal: To walk better without pain PT Goal Formulation: With patient Time For Goal Achievement: 09/25/24 Potential to Achieve Goals: Good    Frequency       Co-evaluation               AM-PAC PT 6 Clicks Mobility  Outcome Measure Help needed turning from your back to your side while in a flat bed without using bedrails?: None Help needed moving from lying on your back to sitting on  the side of a flat bed without using bedrails?: None Help needed moving to and from a bed to a chair (including a wheelchair)?: A Little Help needed standing up from a chair using your arms (e.g., wheelchair or bedside chair)?: A Little Help needed to walk in hospital room?: A Little Help needed climbing 3-5 steps with a railing? : A Little 6 Click Score: 20    End of Session Equipment Utilized During Treatment: Gait belt Activity Tolerance: Other (comment) (Per impression) Patient left: in bed;with nursing/sitter in room;with call bell/phone within reach;with bed alarm set;with SCD's reapplied;Other (comment) (polar care donned to R knee) Nurse Communication: Mobility status;Other (comment) (Per  above) PT Visit Diagnosis: Other abnormalities of gait and mobility (R26.89);Muscle weakness (generalized) (M62.81);Pain Pain - Right/Left: Right Pain - part of body: Knee    Time: 9095-9052 PT Time Calculation (min) (ACUTE ONLY): 43 min   Charges:   PT Evaluation $PT Eval Moderate Complexity: 1 Mod PT Treatments $Gait Training: 8-22 mins PT General Charges $$ ACUTE PT VISIT: 1 Visit    D. Glendia Bertin PT, DPT 09/12/2024, 10:49 AM

## 2024-09-12 NOTE — TOC CM/SW Note (Signed)
 RW and 3-in-1 ordered through [employer].  Lauraine Carpen, CSW [phone]

## 2024-09-14 LAB — FUNGUS CULTURE W SMEAR

## 2024-09-14 LAB — ANAEROBIC AND AEROBIC CULTURE

## 2024-09-15 ENCOUNTER — Other Ambulatory Visit: Payer: Self-pay | Admitting: Internal Medicine

## 2024-09-18 ENCOUNTER — Telehealth: Payer: Self-pay | Admitting: Dermatology

## 2024-09-18 NOTE — Telephone Encounter (Signed)
 Requested medications are due for refill today.  unsure  Requested medications are on the active medications list.  yes  Last refill. 10/15/2018  Future visit scheduled.   yes  Notes to clinic.  Medication is historical    Requested Prescriptions  Pending Prescriptions Disp Refills   FEROSUL 325 (65 Fe) MG tablet [Pharmacy Med Name: FERROUS SULF 325MG  TAB GRN 325 (65 FE) Tablet] 90 tablet 10    Sig: TAKE 1 TABLET BY MOUTH ONCE DAILY *NEW PRESCRIPTION REQUEST*     Endocrinology:  Minerals - Iron Supplementation Failed - 09/18/2024  3:16 PM      Failed - HGB in normal range and within 360 days    Hemoglobin  Date Value Ref Range Status  09/12/2024 11.5 (L) 12.0 - 15.0 g/dL Final  93/96/7974 86.9 11.1 - 15.9 g/dL Final         Failed - HCT in normal range and within 360 days    HCT  Date Value Ref Range Status  09/12/2024 34.1 (L) 36.0 - 46.0 % Final   Hematocrit  Date Value Ref Range Status  02/22/2024 39.8 34.0 - 46.6 % Final         Failed - RBC in normal range and within 360 days    RBC  Date Value Ref Range Status  09/12/2024 3.65 (L) 3.87 - 5.11 MIL/uL Final         Failed - Fe (serum) in normal range and within 360 days    No results found for: IRON, IRONPCTSAT       Failed - Ferritin in normal range and within 360 days    No results found for: FERRITIN       Passed - Valid encounter within last 12 months    Recent Outpatient Visits           6 months ago Annual physical exam   [facility], Leita DEL, MD       Future Appointments             In 5 months Lemon Raisin, MD [facility] at [facility], [phone] [place]            Refused Prescriptions Disp Refills   metFORMIN  (GLUCOPHAGE -XR) 500 MG 24 hr tablet [Pharmacy Med Name: METFORMIN  ER 500MG  TAB 500 Tablet] 180 tablet 10    Sig: TAKE 1 TABLET BY MOUTH TWICE DAILY *NEW PRESCRIPTION REQUEST*      Endocrinology:  Diabetes - Biguanides Failed - 09/18/2024  3:16 PM      Failed - HBA1C is between 0 and 7.9 and within 180 days    Hgb A1c MFr Bld  Date Value Ref Range Status  02/22/2024 5.9 (H) 4.8 - 5.6 % Final    Comment:             Prediabetes: 5.7 - 6.4          Diabetes: >6.4          Glycemic control for adults with diabetes: <7.0          Failed - B12 Level in normal range and within 720 days    No results found for: VITAMINB12       Passed - Cr in normal range and within 360 days    Creatinine, Ser  Date Value Ref Range Status  09/12/2024 0.88 0.44 - 1.00 mg/dL Final  Passed - eGFR in normal range and within 360 days    GFR calc Af Amer  Date Value Ref Range Status  06/14/2020 116 >59 mL/min/1.73 Final    Comment:    **[facility] currently reports eGFR in compliance with the current**   recommendations of the [employer]. [facility] will   update reporting as new guidelines are published from the NKF-ASN   Task force.    GFR, Estimated  Date Value Ref Range Status  09/12/2024 >60 >60 mL/min Final    Comment:    (NOTE) Calculated using the CKD-EPI Creatinine Equation (2021)    eGFR  Date Value Ref Range Status  05/16/2024 66 >59 mL/min/1.73 Final         Passed - Valid encounter within last 6 months    Recent Outpatient Visits           6 months ago Annual physical exam   [facility], Leita DEL, MD       Future Appointments             In 5 months Lemon Raisin, MD [facility] at [facility], [phone] [place]            Passed - CBC within normal limits and completed in the last 12 months    WBC  Date Value Ref Range Status  09/12/2024 15.3 (H) 4.0 - 10.5 K/uL Final   RBC  Date Value Ref Range Status  09/12/2024 3.65 (L) 3.87 - 5.11 MIL/uL Final   Hemoglobin  Date Value Ref Range Status  09/12/2024 11.5 (L) 12.0 - 15.0 g/dL  Final  93/96/7974 86.9 11.1 - 15.9 g/dL Final   HCT  Date Value Ref Range Status  09/12/2024 34.1 (L) 36.0 - 46.0 % Final   Hematocrit  Date Value Ref Range Status  02/22/2024 39.8 34.0 - 46.6 % Final   MCHC  Date Value Ref Range Status  09/12/2024 33.7 30.0 - 36.0 g/dL Final   [facility]  Date Value Ref Range Status  09/12/2024 31.5 26.0 - 34.0 pg Final   MCV  Date Value Ref Range Status  09/12/2024 93.4 80.0 - 100.0 fL Final  02/22/2024 96 79 - 97 fL Final   No results found for: PLTCOUNTKUC, LABPLAT, POCPLA RDW  Date Value Ref Range Status  09/12/2024 13.9 11.5 - 15.5 % Final  02/22/2024 13.0 11.7 - 15.4 % Final

## 2024-09-18 NOTE — Telephone Encounter (Signed)
 Please sign if appropriate

## 2024-09-18 NOTE — Telephone Encounter (Signed)
 Requested Prescriptions  Pending Prescriptions Disp Refills   FEROSUL 325 (65 Fe) MG tablet [Pharmacy Med Name: FERROUS SULF 325MG  TAB GRN 325 (65 FE) Tablet] 90 tablet 10    Sig: TAKE 1 TABLET BY MOUTH ONCE DAILY *NEW PRESCRIPTION REQUEST*     Endocrinology:  Minerals - Iron Supplementation Failed - 09/18/2024  3:16 PM      Failed - HGB in normal range and within 360 days    Hemoglobin  Date Value Ref Range Status  09/12/2024 11.5 (L) 12.0 - 15.0 g/dL Final  93/96/7974 86.9 11.1 - 15.9 g/dL Final         Failed - HCT in normal range and within 360 days    HCT  Date Value Ref Range Status  09/12/2024 34.1 (L) 36.0 - 46.0 % Final   Hematocrit  Date Value Ref Range Status  02/22/2024 39.8 34.0 - 46.6 % Final         Failed - RBC in normal range and within 360 days    RBC  Date Value Ref Range Status  09/12/2024 3.65 (L) 3.87 - 5.11 MIL/uL Final         Failed - Fe (serum) in normal range and within 360 days    No results found for: IRON, IRONPCTSAT       Failed - Ferritin in normal range and within 360 days    No results found for: FERRITIN       Passed - Valid encounter within last 12 months    Recent Outpatient Visits           6 months ago Annual physical exam   Leah Rogers at [facility], Leita DEL, MD       Future Appointments             In 5 months Lemon Raisin, MD [facility] at [facility], [phone] [place]            Refused Prescriptions Disp Refills   metFORMIN  (GLUCOPHAGE -XR) 500 MG 24 hr tablet [Pharmacy Med Name: METFORMIN  ER 500MG  TAB 500 Tablet] 180 tablet 10    Sig: TAKE 1 TABLET BY MOUTH TWICE DAILY *NEW PRESCRIPTION REQUEST*     Endocrinology:  Diabetes - Biguanides Failed - 09/18/2024  3:16 PM      Failed - HBA1C is between 0 and 7.9 and within 180 days    Hgb A1c MFr Bld  Date Value Ref Range Status  02/22/2024 5.9 (H) 4.8 - 5.6 % Final    Comment:              Prediabetes: 5.7 - 6.4          Diabetes: >6.4          Glycemic control for adults with diabetes: <7.0          Failed - B12 Level in normal range and within 720 days    No results found for: VITAMINB12       Passed - Cr in normal range and within 360 days    Creatinine, Ser  Date Value Ref Range Status  09/12/2024 0.88 0.44 - 1.00 mg/dL Final         Passed - eGFR in normal range and within 360 days    GFR calc Af Amer  Date Value Ref Range Status  06/14/2020 116 >59 mL/min/1.73 Final    Comment:    **[facility] currently reports eGFR in compliance  with the current**   recommendations of the [employer]. [facility] will   update reporting as new guidelines are published from the NKF-ASN   Task force.    GFR, Estimated  Date Value Ref Range Status  09/12/2024 >60 >60 mL/min Final    Comment:    (NOTE) Calculated using the CKD-EPI Creatinine Equation (2021)    eGFR  Date Value Ref Range Status  05/16/2024 66 >59 mL/min/1.73 Final         Passed - Valid encounter within last 6 months    Recent Outpatient Visits           6 months ago Annual physical exam   [facility], Leita DEL, MD       Future Appointments             In 5 months Lemon Raisin, MD [facility] at [facility], [phone] [place]            Passed - CBC within normal limits and completed in the last 12 months    WBC  Date Value Ref Range Status  09/12/2024 15.3 (H) 4.0 - 10.5 K/uL Final   RBC  Date Value Ref Range Status  09/12/2024 3.65 (L) 3.87 - 5.11 MIL/uL Final   Hemoglobin  Date Value Ref Range Status  09/12/2024 11.5 (L) 12.0 - 15.0 g/dL Final  93/96/7974 86.9 11.1 - 15.9 g/dL Final   HCT  Date Value Ref Range Status  09/12/2024 34.1 (L) 36.0 - 46.0 % Final   Hematocrit  Date Value Ref Range Status  02/22/2024 39.8 34.0 - 46.6 % Final   MCHC  Date Value Ref  Range Status  09/12/2024 33.7 30.0 - 36.0 g/dL Final   [facility]  Date Value Ref Range Status  09/12/2024 31.5 26.0 - 34.0 pg Final   MCV  Date Value Ref Range Status  09/12/2024 93.4 80.0 - 100.0 fL Final  02/22/2024 96 79 - 97 fL Final   No results found for: PLTCOUNTKUC, LABPLAT, POCPLA RDW  Date Value Ref Range Status  09/12/2024 13.9 11.5 - 15.5 % Final  02/22/2024 13.0 11.7 - 15.4 % Final

## 2024-09-18 NOTE — Telephone Encounter (Signed)
 Spoke to patient by phone. Surgery went well. Scalp is better on pred. Patient can now feel normal skin on scalp. Took cephalexin  before surgery. Discussed that this would potentially have covered the Staph and strep seen on culture. If patient is feeling better then further antibiotics are not needed. Feels tired queasy after taking pred, otherwise tolerating. Sent message to schedule follow up to recheck scalp and ensure improvement. All questions answered

## 2024-09-19 ENCOUNTER — Other Ambulatory Visit: Payer: Self-pay

## 2024-09-19 DIAGNOSIS — B350 Tinea barbae and tinea capitis: Secondary | ICD-10-CM

## 2024-09-19 MED ORDER — KETOCONAZOLE 2 % EX SHAM: 1.0000 | MEDICATED_SHAMPOO | Freq: Every day | CUTANEOUS | 11 refills | Status: AC

## 2024-09-19 NOTE — Progress Notes (Signed)
 Escripted to [employer].

## 2024-09-26 ENCOUNTER — Encounter: Payer: Self-pay | Admitting: Dermatology

## 2024-09-26 ENCOUNTER — Ambulatory Visit (INDEPENDENT_AMBULATORY_CARE_PROVIDER_SITE_OTHER): Admitting: Dermatology

## 2024-09-26 DIAGNOSIS — Z5189 Encounter for other specified aftercare: Secondary | ICD-10-CM

## 2024-09-26 DIAGNOSIS — R21 Rash and other nonspecific skin eruption: Secondary | ICD-10-CM

## 2024-09-26 NOTE — Patient Instructions (Signed)

## 2024-09-26 NOTE — Progress Notes (Signed)
" ° °  Follow-Up Visit   Subjective  Leah Rogers is a 64 y.o. female who presents for the following: Patient is present for wound check, scalp f/u, she has been using the ketoconazole  shampoo and clobetasol  solution. Has not had any negative reactions.   The following portions of the chart were reviewed this encounter and updated as appropriate: medications, allergies, medical history  Review of Systems:  No other skin or systemic complaints except as noted in HPI or Assessment and Plan.  Objective  Well appearing patient in no apparent distress; mood and affect are within normal limits.  A focused examination was performed of the following areas: scalp  Relevant exam findings are noted in the Assessment and Plan.    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  Exam: scalp clear. Biopsy site on scalp well-healed with no remaining suture. Forearms with residual desquamation   Patient c/o joint pain in the knees (knee replacement next week)   Treatment Plan: Finish prednisone  course.  D/c clobetasol  solution and ketoconazole  shampoo fluconazole  terbinafine   Counseling on psoriasis and coordination of care  psoriasis is a chronic non-curable, but treatable genetic/hereditary disease that may have other systemic features affecting other organ systems such as joints (Psoriatic Arthritis). It is associated with an increased risk of inflammatory bowel disease, heart disease, non-alcoholic fatty liver disease, and depression.  Treatments include light and laser treatments; topical medications; and systemic medications including oral and injectables.       Return if symptoms worsen or fail to improve, for Psoriasis, w/ Dr. Claudene.  IAlmetta Nora, RMA, am acting as scribe for Boneta Claudene, MD .   Documentation: I have reviewed the above documentation for accuracy and completeness, and I agree with the above.  Boneta Claudene,  MD    "

## 2024-09-27 ENCOUNTER — Other Ambulatory Visit: Payer: Self-pay | Admitting: Dermatology

## 2025-02-23 ENCOUNTER — Encounter: Admitting: Student

## 2025-08-01 ENCOUNTER — Ambulatory Visit
